# Patient Record
Sex: Male | Born: 1950 | Race: Black or African American | Hispanic: No | Marital: Married | Smoking: Former smoker
Health system: Southern US, Community
[De-identification: ages and names within clinical notes are randomized; demographics above are authoritative.]

## PROBLEM LIST (undated history)

## (undated) DIAGNOSIS — M109 Gout, unspecified: Secondary | ICD-10-CM

## (undated) DIAGNOSIS — I10 Essential (primary) hypertension: Secondary | ICD-10-CM

## (undated) HISTORY — DX: Essential (primary) hypertension: I10

## (undated) HISTORY — PX: COLONOSCOPY: SHX5424

## (undated) HISTORY — PX: WISDOM TOOTH EXTRACTION: SHX21

## (undated) HISTORY — DX: Gout, unspecified: M10.9

## (undated) HISTORY — PX: ARTHROSCOPIC REPAIR ACL: SUR80

---

## 1998-02-01 ENCOUNTER — Emergency Department (HOSPITAL_COMMUNITY): Admission: EM | Admit: 1998-02-01 | Discharge: 1998-02-01 | Payer: Self-pay | Admitting: Emergency Medicine

## 1999-01-01 ENCOUNTER — Encounter: Payer: Self-pay | Admitting: Emergency Medicine

## 1999-01-01 ENCOUNTER — Emergency Department (HOSPITAL_COMMUNITY): Admission: EM | Admit: 1999-01-01 | Discharge: 1999-01-01 | Payer: Self-pay | Admitting: *Deleted

## 2002-07-03 ENCOUNTER — Emergency Department (HOSPITAL_COMMUNITY): Admission: EM | Admit: 2002-07-03 | Discharge: 2002-07-03 | Payer: Self-pay | Admitting: Emergency Medicine

## 2011-02-03 ENCOUNTER — Ambulatory Visit: Payer: BC Managed Care – PPO

## 2011-03-03 ENCOUNTER — Other Ambulatory Visit: Payer: Self-pay | Admitting: Internal Medicine

## 2011-03-03 DIAGNOSIS — I10 Essential (primary) hypertension: Secondary | ICD-10-CM

## 2011-03-09 ENCOUNTER — Other Ambulatory Visit: Payer: Self-pay | Admitting: Internal Medicine

## 2011-04-10 ENCOUNTER — Ambulatory Visit (INDEPENDENT_AMBULATORY_CARE_PROVIDER_SITE_OTHER): Payer: BC Managed Care – PPO | Admitting: Family Medicine

## 2011-04-10 VITALS — BP 158/85 | HR 57 | Temp 98.0°F | Resp 16 | Ht 70.0 in | Wt 193.2 lb

## 2011-04-10 DIAGNOSIS — M7020 Olecranon bursitis, unspecified elbow: Secondary | ICD-10-CM

## 2011-04-10 DIAGNOSIS — M76891 Other specified enthesopathies of right lower limb, excluding foot: Secondary | ICD-10-CM

## 2011-04-10 DIAGNOSIS — D126 Benign neoplasm of colon, unspecified: Secondary | ICD-10-CM

## 2011-04-10 DIAGNOSIS — I10 Essential (primary) hypertension: Secondary | ICD-10-CM

## 2011-04-10 DIAGNOSIS — K635 Polyp of colon: Secondary | ICD-10-CM

## 2011-04-10 DIAGNOSIS — M6580 Other synovitis and tenosynovitis, unspecified site: Secondary | ICD-10-CM

## 2011-04-10 DIAGNOSIS — E119 Type 2 diabetes mellitus without complications: Secondary | ICD-10-CM | POA: Insufficient documentation

## 2011-04-10 MED ORDER — METHYLPREDNISOLONE 4 MG PO TABS: ORAL_TABLET | ORAL | Status: DC

## 2011-04-10 NOTE — Progress Notes (Signed)
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  He does a regular routine of gym workouts and recently did major squat workout with subsequent knee swelling.  Had right effusion in past.  Now both sides of knee have been swollen and sore.  O:  NAD Mildly swollen suprapatellar area without effusion or point tenderness.  Full range of motion.  Area of fluctuance injected with marcaine, but no fluid was aspirated.  Left elbow aspirated after sterile prep, consent and marcaine local:  Amber clear fluid x 12 cc.  No complication.  Ace wrap applied.  A:  Right knee inflammation, left olecranon bursitis  P:  Medrol 4 mg 2 daily x 3, modify workout x 5 day, ace wrap x 3 days

## 2011-04-13 ENCOUNTER — Other Ambulatory Visit: Payer: Self-pay | Admitting: Internal Medicine

## 2011-04-13 ENCOUNTER — Other Ambulatory Visit: Payer: Self-pay | Admitting: Family Medicine

## 2011-04-28 ENCOUNTER — Other Ambulatory Visit: Payer: Self-pay | Admitting: Physician Assistant

## 2011-05-11 ENCOUNTER — Ambulatory Visit (INDEPENDENT_AMBULATORY_CARE_PROVIDER_SITE_OTHER): Payer: BC Managed Care – PPO | Admitting: Internal Medicine

## 2011-05-11 VITALS — BP 160/84 | HR 64 | Temp 98.3°F | Resp 16 | Ht 70.0 in | Wt 190.8 lb

## 2011-05-11 DIAGNOSIS — I10 Essential (primary) hypertension: Secondary | ICD-10-CM

## 2011-05-11 DIAGNOSIS — D1739 Benign lipomatous neoplasm of skin and subcutaneous tissue of other sites: Secondary | ICD-10-CM

## 2011-05-11 DIAGNOSIS — D170 Benign lipomatous neoplasm of skin and subcutaneous tissue of head, face and neck: Secondary | ICD-10-CM

## 2011-05-11 DIAGNOSIS — E119 Type 2 diabetes mellitus without complications: Secondary | ICD-10-CM

## 2011-05-11 LAB — POCT URINALYSIS DIPSTICK
Bilirubin, UA: NEGATIVE
Blood, UA: NEGATIVE
Glucose, UA: NEGATIVE
Leukocytes, UA: NEGATIVE
Nitrite, UA: NEGATIVE

## 2011-05-11 LAB — POCT CBC
Granulocyte percent: 64.6 %G (ref 37–80)
HCT, POC: 41.2 % — AB (ref 43.5–53.7)
Hemoglobin: 13.1 g/dL — AB (ref 14.1–18.1)
Lymph, poc: 1.6 (ref 0.6–3.4)
MCH, POC: 28.5 pg (ref 27–31.2)
MCHC: 31.8 g/dL (ref 31.8–35.4)
MCV: 89.6 fL (ref 80–97)
MID (cbc): 0.3 (ref 0–0.9)
MPV: 7.1 fL (ref 0–99.8)
POC Granulocyte: 3.6 (ref 2–6.9)
POC LYMPH PERCENT: 29.9 %L (ref 10–50)
POC MID %: 5.5 %M (ref 0–12)
Platelet Count, POC: 383 10*3/uL (ref 142–424)
RBC: 4.6 M/uL — AB (ref 4.69–6.13)
RDW, POC: 12.3 %
WBC: 5.5 10*3/uL (ref 4.6–10.2)

## 2011-05-11 LAB — COMPREHENSIVE METABOLIC PANEL
ALT: 15 U/L (ref 0–53)
AST: 19 U/L (ref 0–37)
Albumin: 4.7 g/dL (ref 3.5–5.2)
Alkaline Phosphatase: 39 U/L (ref 39–117)
BUN: 15 mg/dL (ref 6–23)
CO2: 29 mEq/L (ref 19–32)
Calcium: 10.2 mg/dL (ref 8.4–10.5)
Chloride: 104 mEq/L (ref 96–112)
Creat: 1.35 mg/dL (ref 0.50–1.35)
Glucose, Bld: 122 mg/dL — ABNORMAL HIGH (ref 70–99)
Potassium: 4.2 mEq/L (ref 3.5–5.3)
Sodium: 142 mEq/L (ref 135–145)
Total Bilirubin: 0.7 mg/dL (ref 0.3–1.2)
Total Protein: 7.2 g/dL (ref 6.0–8.3)

## 2011-05-11 LAB — POCT GLYCOSYLATED HEMOGLOBIN (HGB A1C): Hemoglobin A1C: 5

## 2011-05-11 LAB — LIPID PANEL: HDL: 67 mg/dL (ref >39)

## 2011-05-11 MED ORDER — LISINOPRIL-HYDROCHLOROTHIAZIDE 20-12.5 MG PO TABS: 1.0000 | ORAL_TABLET | Freq: Two times a day (BID) | ORAL | Status: DC

## 2011-05-11 MED ORDER — METFORMIN HCL 500 MG PO TABS: 500.0000 mg | ORAL_TABLET | Freq: Every day | ORAL | Status: DC

## 2011-05-11 NOTE — Progress Notes (Signed)
  Subjective:    Patient ID: Matthew Simon, male    DOB: May 25, 1950, 61 y.o.   MRN: [id number]  HPI  Matthew Simon is here today for a refill on his metformin.  He does not have a glucometer at home and tells me he is "pre-diabetic".  He feels well and states his BP has not been a problem for him.  He is also here for a lump on the back of his neck which he first noticed one week ago, it is not tender or sore.  He continues to work in Airline pilot, has no lower extremity edema, SOB, or foot sores.  His right knee is somewhat improved, still bothers him sometimes.   Review of Systems  Constitutional: Negative.   HENT: Negative.   Eyes: Negative.   Respiratory: Negative.   Cardiovascular: Negative.   Gastrointestinal: Negative.   Genitourinary: Negative.   Psychiatric/Behavioral: Negative.   All other systems reviewed and are negative.       Objective:   Physical Exam  Vitals reviewed. Constitutional: He is oriented to person, place, and time. He appears well-developed and well-nourished. No distress.  HENT:  Head: Normocephalic.  Mouth/Throat: Oropharynx is clear and moist.  Cardiovascular: Normal rate, regular rhythm and normal heart sounds.   Pulmonary/Chest: Effort normal and breath sounds normal.  Abdominal: Soft.  Lymphadenopathy:    He has no cervical adenopathy.  Neurological: He is alert and oriented to person, place, and time.  Skin: Skin is warm and dry.       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 His behavior is normal.       Pt does not make eye contact when he speaks with me          Assessment & Plan:  Type 2 DM:  Excellent control with A1C at 5.0, continue metformin daily.  Paper chart is not available during pt OV today so I have no previous labs for comparison.  Pt will follow-up with Dr. Perrin Maltese in 6 month.  Lipid panel and CMP pending  HTN:  Not controlled.  Discussed adding amlodipine but pt  prefers to increase current med to BID so we will do that.  He is to monitor his BP and return if it remains elevated or he has other concerns.  Lipoma:  Pt advised this can be removed with possibility of recurring.  He opts for watchful waiting for now.

## 2011-05-11 NOTE — Patient Instructions (Signed)
Please monitor your blood pressure, by checking once or twice a week and if it remains elevated, above 135/85 return to see Dr. Perrin Maltese.  Continue your metformin daily for optimal glucose control. Hypertension As your heart beats, it forces blood through your arteries. This force is your blood pressure. If the pressure is too high, it is called hypertension (HTN) or high blood pressure. HTN is dangerous because you may have it and not know it. High blood pressure may mean that your heart has to work harder to pump blood. Your arteries may be narrow or stiff. The extra work puts you at risk for heart disease, stroke, and other problems.  Blood pressure consists of two numbers, a higher number over a lower, 110/72, for example. It is stated as "110 over 72." The ideal is below 120 for the top number (systolic) and under 80 for the bottom (diastolic). Write down your blood pressure today. You should pay close attention to your blood pressure if you have certain conditions such as:  Heart failure.   Prior heart attack.   Diabetes   Chronic kidney disease.   Prior stroke.   Multiple risk factors for heart disease.  To see if you have HTN, your blood pressure should be measured while you are seated with your arm held at the level of the heart. It should be measured at least twice. A one-time elevated blood pressure reading (especially in the Emergency Department) does not mean that you need treatment. There may be conditions in which the blood pressure is different between your right and left arms. It is important to see your caregiver soon for a recheck. Most people have essential hypertension which means that there is not a specific cause. This type of high blood pressure may be lowered by changing lifestyle factors such as:  Stress.   Smoking.   Lack of exercise.   Excessive weight.   Drug/tobacco/alcohol use.   Eating less salt.  Most people do not have symptoms from high blood pressure  until it has caused damage to the body. Effective treatment can often prevent, delay or reduce that damage. TREATMENT  When a cause has been identified, treatment for high blood pressure is directed at the cause. There are a large number of medications to treat HTN. These fall into several categories, and your caregiver will help you select the medicines that are best for you. Medications may have side effects. You should review side effects with your caregiver. If your blood pressure stays high after you have made lifestyle changes or started on medicines,   Your medication(s) may need to be changed.   Other problems may need to be addressed.   Be certain you understand your prescriptions, and know how and when to take your medicine.   Be sure to follow up with your caregiver within the time frame advised (usually within two weeks) to have your blood pressure rechecked and to review your medications.   If you are taking more than one medicine to lower your blood pressure, make sure you know how and at what times they should be taken. Taking two medicines at the same time can result in blood pressure that is too low.  SEEK IMMEDIATE MEDICAL CARE IF:  You develop a severe headache, blurred or changing vision, or confusion.   You have unusual weakness or numbness, or a faint feeling.   You have severe chest or abdominal pain, vomiting, or breathing problems.  MAKE SURE YOU:   Understand  these instructions.   Will watch your condition.   Will get help right away if you are not doing well or get worse.  Document Released: 01/19/2005 Document Revised: 01/08/2011 Document Reviewed: 09/09/2007 [facility] Patient Information 2012 [place], [place].

## 2011-07-30 ENCOUNTER — Ambulatory Visit (INDEPENDENT_AMBULATORY_CARE_PROVIDER_SITE_OTHER): Payer: BC Managed Care – PPO | Admitting: Family Medicine

## 2011-07-30 VITALS — BP 142/81 | HR 76 | Temp 98.2°F | Resp 16 | Ht 70.0 in | Wt 192.0 lb

## 2011-07-30 DIAGNOSIS — M25469 Effusion, unspecified knee: Secondary | ICD-10-CM

## 2011-07-30 DIAGNOSIS — M25569 Pain in unspecified knee: Secondary | ICD-10-CM

## 2011-07-30 DIAGNOSIS — M25461 Effusion, right knee: Secondary | ICD-10-CM

## 2011-07-30 DIAGNOSIS — M25561 Pain in right knee: Secondary | ICD-10-CM

## 2011-07-30 NOTE — Progress Notes (Signed)
61 yo active man who developed a recurrence of effusion in right knee beginning 10 days PTA.  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  Originally, he injured the right knee wrestling in high school  O:  NAD' Moderate fluid effusion right knee.  Decreased flexion to 90 degrees.  No ligament laxity.  Crepitus with extension along lateral joint line.  After informed consent, the right knee was anesthetized with local lidocaine and marcaine at the lateral joint margin.  34 cc of clear yellow fluid was aspirated and 1 cc of marcaine with depomedrol was injected.  No complications.  Patient tolerated procedure well and had normal gain when leaving.  Synovial fluid was sent for cells and crystals.  A:  Recurrent effusion right knee secondary to internal derangement  P:  Synovial fluid analysis MR of right knee

## 2011-07-30 NOTE — Addendum Note (Signed)
Addended by: Johnnette Litter on: 07/30/2011 02:38 PM   Modules accepted: Orders

## 2011-07-31 LAB — SYNOVIAL CELL COUNT + DIFF, W/ CRYSTALS
Crystals, Fluid: NONE SEEN
Eosinophils-Synovial: 0 % (ref 0–1)
Lymphocytes-Synovial Fld: 0 % (ref 0–20)
Monocyte/Macrophage: 12 % — ABNORMAL LOW (ref 50–90)
Neutrophil, Synovial: 88 % — ABNORMAL HIGH (ref 0–25)
WBC, Synovial: 470 cu mm — ABNORMAL HIGH (ref 0–200)

## 2011-08-03 LAB — BODY FLUID CULTURE
Gram Stain: NONE SEEN
Gram Stain: NONE SEEN
Organism ID, Bacteria: NO GROWTH

## 2011-08-10 ENCOUNTER — Ambulatory Visit (INDEPENDENT_AMBULATORY_CARE_PROVIDER_SITE_OTHER): Payer: BC Managed Care – PPO | Admitting: Family Medicine

## 2011-08-10 VITALS — BP 126/74 | HR 63 | Temp 98.3°F | Resp 16 | Ht 69.5 in | Wt 188.4 lb

## 2011-08-10 DIAGNOSIS — M25461 Effusion, right knee: Secondary | ICD-10-CM

## 2011-08-10 DIAGNOSIS — M25469 Effusion, unspecified knee: Secondary | ICD-10-CM

## 2011-08-10 DIAGNOSIS — M6740 Ganglion, unspecified site: Secondary | ICD-10-CM

## 2011-08-10 NOTE — Patient Instructions (Signed)
Ganglion A ganglion is a swelling under the skin that is filled with a thick, jelly-like substance. It is a synovial cyst. This is caused by a break (rupture) of the joint lining from the joint space. A ganglion often occurs near an area of repeated minor trauma (damage caused by an accident). Trauma may also be a repetitive movement at work or in a sport. TREATMENT   It often goes away without treatment. It may reappear later. Sometimes a ganglion may need to be surgically removed. Often they are drained and injected with a steroid. Sometimes they respond to:  Rest.   Splinting.  HOME CARE INSTRUCTIONS    Your caregiver will decide the best way of treating your ganglion. Do not try to break the ganglion yourself by pressing on it, poking it with a needle, or hitting it with a heavy object.   Use medications as directed.  SEEK MEDICAL CARE IF:    The ganglion becomes larger or more painful.   You have increased redness or swelling.   You have weakness or numbness in your hand or wrist.  MAKE SURE YOU:    Understand these instructions.   Will watch your condition.   Will get help right away if you are not doing well or get worse.  Document Released: 01/17/2000 Document Revised: 01/08/2011 Document Reviewed: 03/15/2007 [employer] Patient Information 2012 [employer]. 

## 2011-08-10 NOTE — Progress Notes (Signed)
61 yo with recurrent right knee effusion.  He did well for a few days after the fluid was drawn off last week, but it has come back and it is uncomfortable.    He also has a swelling on his lower posterior neck, minimally uncomfortable.    He also has bilateral radial sided wrist ganglions.  O:  NAD Right knee:  Moderate effusion.  Definite crepitus with gentle passive ROM. After xylo with epi local anesthesia, 30 ml serous fluid was drawn off.  No complications.  Feels better.  Cyst posterior lower cervical spine: nonfluctuant, nontender c/w lipoma  Bilateral ganglion cysts on radial wrists.  Assessment:  Recurrent knee effusion  Plan: Referral to orthopedics.

## 2011-08-18 ENCOUNTER — Other Ambulatory Visit: Payer: Self-pay

## 2011-08-26 ENCOUNTER — Telehealth: Payer: Self-pay

## 2011-08-26 NOTE — Telephone Encounter (Signed)
DR.LAUENSTEIN, PT STATES THAT HE WOULD LIKE TO SPEAK WITH YOU REGARDING A MRI WOULD NOT GIVE ANY FURTHER DETAILS. BEST# [phone]

## 2011-08-27 ENCOUNTER — Telehealth: Payer: Self-pay

## 2011-08-27 NOTE — Telephone Encounter (Signed)
Pt states he has called three times for dr. l to call him back this is urgent medical situation

## 2011-08-27 NOTE — Telephone Encounter (Signed)
Left message on machine to call back  

## 2011-08-28 NOTE — Telephone Encounter (Signed)
I tried to call him back twice.  I left a message on his machine  I tried to refer him to orthopedics after the second time I drew blood off of his knee.   There is nothing further I think I can do for his knee

## 2011-08-28 NOTE — Telephone Encounter (Signed)
Dr L Patient would like a call back at [phone].

## 2011-09-19 ENCOUNTER — Other Ambulatory Visit: Payer: Self-pay | Admitting: Internal Medicine

## 2011-11-23 ENCOUNTER — Other Ambulatory Visit: Payer: Self-pay | Admitting: Internal Medicine

## 2011-11-23 NOTE — Telephone Encounter (Signed)
Per note in April 2013 pt to follow up in 6 months with Dr. Perrin Maltese, needs OV/labs for more refills

## 2011-12-29 ENCOUNTER — Other Ambulatory Visit: Payer: Self-pay | Admitting: Internal Medicine

## 2012-02-12 ENCOUNTER — Ambulatory Visit (INDEPENDENT_AMBULATORY_CARE_PROVIDER_SITE_OTHER): Payer: BC Managed Care – PPO | Admitting: Family Medicine

## 2012-02-12 ENCOUNTER — Ambulatory Visit: Payer: BC Managed Care – PPO

## 2012-02-12 VITALS — BP 173/95 | HR 60 | Temp 98.2°F | Resp 16 | Ht 71.0 in | Wt 190.0 lb

## 2012-02-12 DIAGNOSIS — M25569 Pain in unspecified knee: Secondary | ICD-10-CM

## 2012-02-12 DIAGNOSIS — E119 Type 2 diabetes mellitus without complications: Secondary | ICD-10-CM

## 2012-02-12 DIAGNOSIS — Z23 Encounter for immunization: Secondary | ICD-10-CM

## 2012-02-12 DIAGNOSIS — Z0000 Encounter for general adult medical examination without abnormal findings: Secondary | ICD-10-CM

## 2012-02-12 DIAGNOSIS — I10 Essential (primary) hypertension: Secondary | ICD-10-CM

## 2012-02-12 LAB — COMPREHENSIVE METABOLIC PANEL
ALT: 21 U/L (ref 0–53)
CO2: 30 mEq/L (ref 19–32)
Creat: 1.25 mg/dL (ref 0.50–1.35)
Total Bilirubin: 0.7 mg/dL (ref 0.3–1.2)

## 2012-02-12 LAB — POCT CBC
HCT, POC: 40.7 % — AB (ref 43.5–53.7)
Lymph, poc: 1.5 (ref 0.6–3.4)
MCHC: 30.2 g/dL — AB (ref 31.8–35.4)
MID (cbc): 0.3 (ref 0–0.9)
MPV: 6.9 fL (ref 0–99.8)
POC Granulocyte: 3.6 (ref 2–6.9)
POC LYMPH PERCENT: 28.4 %L (ref 10–50)
POC MID %: 4.9 %M (ref 0–12)
RDW, POC: 12.4 %

## 2012-02-12 LAB — LIPID PANEL
Cholesterol: 232 mg/dL — ABNORMAL HIGH (ref 0–200)
HDL: 62 mg/dL (ref >39)
Total CHOL/HDL Ratio: 3.7 Ratio

## 2012-02-12 LAB — IFOBT (OCCULT BLOOD): IFOBT: NEGATIVE

## 2012-02-12 LAB — POCT GLYCOSYLATED HEMOGLOBIN (HGB A1C): Hemoglobin A1C: 4.8

## 2012-02-12 MED ORDER — METFORMIN HCL 500 MG PO TABS: 500.0000 mg | ORAL_TABLET | Freq: Every day | ORAL | Status: DC

## 2012-02-12 MED ORDER — LISINOPRIL-HYDROCHLOROTHIAZIDE 20-12.5 MG PO TABS: 1.0000 | ORAL_TABLET | Freq: Two times a day (BID) | ORAL | Status: DC

## 2012-02-12 NOTE — Patient Instructions (Addendum)
Be sure to call Dr. Laural Benes at [facility] about your colonoscopy- it looks like it may be due.    I will be in touch with the rest of your labs as soon as they are available.   He had a tetanus shot in 2009 If you would like, you can get a shingles shot at the drug store.   I will refer you to general surgery to look a the area on the back of your neck.

## 2012-02-12 NOTE — Progress Notes (Signed)
[facility] [address] [place] [address] [phone]  Date:  02/12/2012   Name:  Matthew Simon   DOB:  06/16/1950   MRN:  [id number]  PCP:  No primary provider on file.    Chief Complaint: complete physical   History of Present Illness:  Matthew Simon is a 62 y.o. very pleasant male patient who presents with the following:  Here today for a CPE.  He has a history of HTN and DM.   He does not check his glucose at home.   He is fasting today for labs  He has had trouble with his right knee for a few months- it has swollen up and will be occcasionally painful along the medial joint line.  However, it does not cause persistent pain.    Colonoscopy performed 06/2005.  He is not sure if he was supposed to follow- up in 5 years but thinks that was the recommendation  Patient Active Problem List  Diagnosis  . Hypertension  . Colon polyp  . Type 2 diabetes mellitus    Past Medical History  Diagnosis Date  . Hypertension     History reviewed. No pertinent past surgical history.  History  Substance Use Topics  . Smoking status: Former Smoker    Quit date: 05/10/1964  . Smokeless tobacco: Never Used  . Alcohol Use: 10.5 oz/week    21 drink(s) per week    History reviewed. No pertinent family history.  No Known Allergies  Medication list has been reviewed and updated.  Current Outpatient Prescriptions on File Prior to Visit  Medication Sig Dispense Refill  . aspirin 81 MG tablet Take 81 mg by mouth daily.      Marland Kitchen lisinopril-hydrochlorothiazide (PRINZIDE,ZESTORETIC) 20-12.5 MG per tablet Take 1 tablet by mouth 2 (two) times daily.  180 tablet  1  . metFORMIN (GLUCOPHAGE) 500 MG tablet TAKE 1 TABLET (500 MG TOTAL) BY MOUTH DAILY WITH BREAKFAST. NEEDS OFFICE VISIT FOR MORE  30 tablet  0  . calcium & magnesium carbonates (MYLANTA) 311-232 MG per tablet Take 1 tablet by mouth daily.      Marland Kitchen ketoconazole (NIZORAL) 2 % cream APPLY TO FEET FOR FUNGUS  TWICE A DAY  120 g  2  . metFORMIN (GLUCOPHAGE) 500 MG tablet TAKE 1 TABLET (500 MG TOTAL) BY MOUTH DAILY WITH BREAKFAST. NEEDS OFFICE VISIT FOR MORE  30 tablet  0  . methylPREDNISolone (MEDROL) 4 MG tablet 2 daily  6 tablet  0    Review of Systems:  As per HPI- otherwise negative.   Physical Examination: Filed Vitals:   02/12/12 1227  BP: 173/95  Pulse: 60  Temp: 98.2 F (36.8 C)  Resp: 16   Filed Vitals:   02/12/12 1227  Height: 5\' 11"  (1.803 m)  Weight: 190 lb (86.183 kg)   Body mass index is 26.50 kg/(m^2). Ideal Body Weight: Weight in (lb) to have BMI = 25: 178.9   GEN: WDWN, NAD, Non-toxic, A & O x 3 HEENT: Atraumatic, Normocephalic. Neck supple. No masses, No LAD. Ears and Nose: No external deformity. CV: RRR, No M/G/R. No JVD. No thrill. No extra heart sounds. PULM: CTA B, no wheezes, crackles, rhonchi. No retractions. No resp. distress. No accessory muscle use. ABD: S, NT, ND, +BS. No rebound. No HSM. He points out a ping- pong ball sized mass at the base of his neck- consistent with a lipoma  EXTR: No c/c/e NEURO Normal gait.  PSYCH: Normally  interactive. Conversant. Not depressed or anxious appearing.  Calm demeanor.  Right knee: slight swelling, normal ROM.  Knee is stable, no significant crepitus.   GU:   [facility] reading (PRIMARY) by  Dr. Patsy Lager. Right knee: compare with films performed 09/2010 Moderate medial compartment degenerative change and lateral patellar spur  RIGHT KNEE - COMPLETE 4+ VIEW  Comparison: None.  Findings: Negative for fracture. Normal alignment. Mild joint space narrowing and spurring medially. Mild patellofemoral spurring. There is a moderately large joint effusion.  IMPRESSION: Osteoarthritis with joint effusion  Results for orders placed in visit on 02/12/12  POCT CBC      Component Value Range   WBC 5.4  4.6 - 10.2 K/uL   Lymph, poc 1.5  0.6 - 3.4   POC LYMPH PERCENT 28.4  10 - 50 %L   MID (cbc) 0.3  0 - 0.9   POC MID %  4.9  0 - 12 %M   POC Granulocyte 3.6  2 - 6.9   Granulocyte percent 66.7  37 - 80 %G   RBC 4.37 (*) 4.69 - 6.13 M/uL   Hemoglobin 12.3 (*) 14.1 - 18.1 g/dL   HCT, POC 16.1 (*) 09.6 - 53.7 %   MCV 93.1  80 - 97 fL   MCH, POC 28.1  27 - 31.2 pg   MCHC 30.2 (*) 31.8 - 35.4 g/dL   RDW, POC 04.5     Platelet Count, POC 333  142 - 424 K/uL   MPV 6.9  0 - 99.8 fL  IFOBT (OCCULT BLOOD)      Component Value Range   IFOBT Negative    POCT GLYCOSYLATED HEMOGLOBIN (HGB A1C)      Component Value Range   Hemoglobin A1C 4.8      Assessment and Plan: 1. Physical exam, annual  POCT CBC, IFOBT POC (occult bld, rslt in office), Comprehensive metabolic panel, Lipid panel, PSA  2. Diabetes mellitus, type 2  POCT glycosylated hemoglobin (Hb A1C), metFORMIN (GLUCOPHAGE) 500 MG tablet  3. HTN (hypertension)  Lipid panel, lisinopril-hydrochlorothiazide (PRINZIDE,ZESTORETIC) 20-12.5 MG per tablet  4. Knee pain  DG Knee Complete 4 Views Right  5. Essential hypertension, benign  lisinopril-hydrochlorothiazide (PRINZIDE,ZESTORETIC) 20-12.5 MG per tablet  6. Needs flu shot  Flu vaccine greater than or equal to 3yo preservative free IM   Completed labs for age today and gave rx for zostavax.   Will follow- up as soon as labs available.   Refilled medications.  Control of DM is excellent. He did not take his BP medication yet today, but it has been well controlled on previous visits.    He has knee pain and swelling. Discussed probable diagnosis of a meniscal tear.  Offered referral to ortho but he is not sure he wants to do this yet.  He will let me know if he wants to pursue this.    COPLAND,JESSICA, MD  PSA 07/2010 was 17.9 during an episode of prostatitis.  Will follow- up today

## 2012-02-13 LAB — POCT URINALYSIS DIPSTICK
Bilirubin, UA: NEGATIVE
Blood, UA: NEGATIVE
Ketones, UA: NEGATIVE
Spec Grav, UA: 1.015
pH, UA: 7

## 2012-02-13 LAB — PSA: PSA: 1.09 ng/mL (ref <=4.00)

## 2012-02-16 ENCOUNTER — Encounter: Payer: Self-pay | Admitting: Family Medicine

## 2012-09-02 ENCOUNTER — Ambulatory Visit (INDEPENDENT_AMBULATORY_CARE_PROVIDER_SITE_OTHER): Payer: No Typology Code available for payment source | Admitting: Family Medicine

## 2012-09-02 VITALS — BP 135/85 | HR 86 | Temp 98.0°F | Resp 16 | Ht 71.0 in | Wt 186.0 lb

## 2012-09-02 DIAGNOSIS — M7020 Olecranon bursitis, unspecified elbow: Secondary | ICD-10-CM

## 2012-09-02 DIAGNOSIS — E119 Type 2 diabetes mellitus without complications: Secondary | ICD-10-CM

## 2012-09-02 DIAGNOSIS — E785 Hyperlipidemia, unspecified: Secondary | ICD-10-CM

## 2012-09-02 DIAGNOSIS — M25469 Effusion, unspecified knee: Secondary | ICD-10-CM

## 2012-09-02 DIAGNOSIS — M7022 Olecranon bursitis, left elbow: Secondary | ICD-10-CM

## 2012-09-02 DIAGNOSIS — M25461 Effusion, right knee: Secondary | ICD-10-CM

## 2012-09-02 DIAGNOSIS — B353 Tinea pedis: Secondary | ICD-10-CM

## 2012-09-02 LAB — POCT CBC
Lymph, poc: 2.1 (ref 0.6–3.4)
MCHC: 31.3 g/dL — AB (ref 31.8–35.4)
MID (cbc): 0.4 (ref 0–0.9)
MPV: 6.6 fL (ref 0–99.8)
POC Granulocyte: 3.4 (ref 2–6.9)
POC LYMPH PERCENT: 35.7 %L (ref 10–50)
POC MID %: 6.3 %M (ref 0–12)
RDW, POC: 12.6 %

## 2012-09-02 LAB — LIPID PANEL: Cholesterol: 206 mg/dL — ABNORMAL HIGH (ref 0–200)

## 2012-09-02 MED ORDER — KETOCONAZOLE 2 % EX CREA: TOPICAL_CREAM | CUTANEOUS | Status: DC

## 2012-09-02 NOTE — Patient Instructions (Signed)
Let me know if you have any sign of infection such as redness, swelling, or tenderness of your knee of elbow.   If you decide you would like to see an orthopedist or have an MRI of your knee please let me know Please do follow- up with your GI doctor- you are due for your colonocopy

## 2012-09-02 NOTE — Progress Notes (Signed)
[facility] [address], [place] [address] [phone]  Date:  09/02/2012   Name:  Matthew Simon   DOB:  Jan 04, 1951   MRN:  [id number]  PCP:  No primary provider on file.    Chief Complaint: Joint Swelling and Joint Swelling   History of Present Illness:  Matthew Simon is a 62 y.o. very pleasant male patient who presents with the following:  He has recurrent left elbow olecranon bursitis.  It has swollen up over the last couple of weeks.  It is not really tender.   This has occurred twice this year.  He is not aware of any acute injury.    He has swelling in his right knee again as well. He had an effusion drained about one year ago with good results.  It has been swollen again for about 2 weeks.  It hurts "if I move a certain way."  He is a former avid Armed forces operational officer, and thinks he likely has wear and tear on the knee.  He has noted swelling off and on for about 2 years.  The knee is sometimes worse after he exercises.   The knee can click.  It does feel truly unstable, but not 100% stable either.    He has NIDDM, last A1c was 4.8 in January of this year.  He takes glucophage and lisinopril, aspirin some times.   He did not follow-up his colonoscopy yet- he thinks his last was in 2006 and he was asked to come back for a 5 year follow-up   Patient Active Problem List   Diagnosis Date Noted  . Hypertension 04/10/2011  . Colon polyp 04/10/2011  . Type 2 diabetes mellitus 04/10/2011    Past Medical History  Diagnosis Date  . Hypertension     History reviewed. No pertinent past surgical history.  History  Substance Use Topics  . Smoking status: Former Smoker    Quit date: 05/10/1964  . Smokeless tobacco: Never Used  . Alcohol Use: 10.5 oz/week    21 drink(s) per week    History reviewed. No pertinent family history.  No Known Allergies  Medication list has been reviewed and updated.  Current Outpatient Prescriptions on File Prior to Visit   Medication Sig Dispense Refill  . aspirin 81 MG tablet Take 81 mg by mouth daily.      . calcium & magnesium carbonates (MYLANTA) 311-232 MG per tablet Take 1 tablet by mouth daily.      Marland Kitchen ketoconazole (NIZORAL) 2 % cream APPLY TO FEET FOR FUNGUS TWICE A DAY  120 g  2  . lisinopril-hydrochlorothiazide (PRINZIDE,ZESTORETIC) 20-12.5 MG per tablet Take 1 tablet by mouth 2 (two) times daily.  180 tablet  3  . metFORMIN (GLUCOPHAGE) 500 MG tablet Take 1 tablet (500 mg total) by mouth daily with breakfast.  90 tablet  3  . methylPREDNISolone (MEDROL) 4 MG tablet 2 daily  6 tablet  0   No current facility-administered medications on file prior to visit.    Review of Systems:  As per HPI- otherwise negative.   Physical Examination: Filed Vitals:   09/02/12 1328  BP: 140/90  Pulse: 86  Temp: 98 F (36.7 C)  Resp: 16   Filed Vitals:   09/02/12 1328  Height: 5\' 11"  (1.803 m)  Weight: 186 lb (84.369 kg)   Body mass index is 25.95 kg/(m^2). Ideal Body Weight: Weight in (lb) to have BMI = 25: 178.9  GEN: WDWN, NAD,  Non-toxic, A & O x 3 HEENT: Atraumatic, Normocephalic. Neck supple. No masses, No LAD. Ears and Nose: No external deformity. CV: RRR, No M/G/R. No JVD. No thrill. No extra heart sounds. PULM: CTA B, no wheezes, crackles, rhonchi. No retractions. No resp. distress. No accessory muscle use. EXTR: No c/c/e NEURO Normal gait.  PSYCH: Normally interactive. Conversant. Not depressed or anxious appearing.  Calm demeanor.  Left elbow: there is a soft swelling, about the size of a ping pong ball over the olecranon bursa.  No redness, tenderness, heat or other sign of infection.  Normal flexion, extension, pronation and supination Right knee: mild crepitus, full ROM.  Knee is stable, there is a small to moderate effusion.  No redness or heat.  Negative anterior drawer.    Results for orders placed in visit on 09/02/12  POCT GLYCOSYLATED HEMOGLOBIN (HGB A1C)      Result Value Range    Hemoglobin A1C 4.7    POCT CBC      Result Value Range   WBC 5.9  4.6 - 10.2 K/uL   Lymph, poc 2.1  0.6 - 3.4   POC LYMPH PERCENT 35.7  10 - 50 %L   MID (cbc) 0.4  0 - 0.9   POC MID % 6.3  0 - 12 %M   POC Granulocyte 3.4  2 - 6.9   Granulocyte percent 58.0  37 - 80 %G   RBC 4.36 (*) 4.69 - 6.13 M/uL   Hemoglobin 12.8 (*) 14.1 - 18.1 g/dL   HCT, POC 16.1 (*) 09.6 - 53.7 %   MCV 93.7  80 - 97 fL   MCH, POC 29.4  27 - 31.2 pg   MCHC 31.3 (*) 31.8 - 35.4 g/dL   RDW, POC 04.5     Platelet Count, POC 321  142 - 424 K/uL   MPV 6.6  0 - 99.8 fL    Discussed with pt.  He has recurrent olecranon bursitis of his elbow and recurrent knee effusion likely due to degenerative change or meniscal tear.  Explained that there is always a risk of infection with any joint aspiration, and that the fluid is likely to come back.  He would like to proceed with elbow and knee aspiration .   VC obtained.   Left elbow prepped with betadine and alcohol.  Local anesthesia with a small wheal of plain 1% lidocaine.  Aspirated with an 18 gauge needle- serous fluid, clear.  Pt tolerated well, dressed with a band- aid and ace bandage for light compression Right knee prepped with betadine and alcohol.  Placed 3ml of lidocaine in a track on lateral approach.  Introduced 20 gauge needle and withdrew clear serous fluid from knee.  Pt tolerated well, dressed with a band- aid Aspirated 12 cc from left elbow, 22 cc from right knee  Assessment and Plan: Type II or unspecified type diabetes mellitus without mention of complication, not stated as uncontrolled - Plan: POCT glycosylated hemoglobin (Hb A1C)  Dyslipidemia (high LDL; low HDL) - Plan: POCT CBC, Lipid panel  Tinea pedis - Plan: ketoconazole (NIZORAL) 2 % cream  Olecranon bursitis of left elbow  Knee effusion, right  DM well coontroled Await LDL for further follow-up Refilled his nizoral cram for tinea pedis Mild anemia- reminded to follow- up with GI.      Signed Abbe Amsterdam, MD

## 2012-09-03 ENCOUNTER — Encounter: Payer: Self-pay | Admitting: Family Medicine

## 2012-09-05 ENCOUNTER — Telehealth: Payer: Self-pay

## 2012-09-05 NOTE — Telephone Encounter (Signed)
Patient would like only dr copland to call him regarding he is having symptoms? Best number is [phone]

## 2012-09-05 NOTE — Telephone Encounter (Signed)
Call is regarding is last diagnosis?

## 2012-09-05 NOTE — Telephone Encounter (Signed)
Type II or unspecified type diabetes mellitus without mention of complication, not stated as uncontrolled - Plan: POCT glycosylated hemoglobin (Hb A1C)  Dyslipidemia (high LDL; low HDL) - Plan: POCT CBC, Lipid panel  Tinea pedis - Plan: ketoconazole (NIZORAL) 2 % cream  Olecranon bursitis of left elbow  Knee effusion, right  DM well coontroled  Await LDL for further follow-up  Refilled his nizoral cram for tinea pedis  Mild anemia- reminded to follow- up with GI.   Left message for him to call me back.

## 2012-09-07 ENCOUNTER — Ambulatory Visit (INDEPENDENT_AMBULATORY_CARE_PROVIDER_SITE_OTHER): Payer: No Typology Code available for payment source | Admitting: Emergency Medicine

## 2012-09-07 ENCOUNTER — Ambulatory Visit: Payer: No Typology Code available for payment source

## 2012-09-07 VITALS — BP 124/60 | HR 98 | Temp 98.1°F | Resp 16 | Ht 71.0 in | Wt 186.8 lb

## 2012-09-07 DIAGNOSIS — L0291 Cutaneous abscess, unspecified: Secondary | ICD-10-CM

## 2012-09-07 DIAGNOSIS — E119 Type 2 diabetes mellitus without complications: Secondary | ICD-10-CM

## 2012-09-07 DIAGNOSIS — M25522 Pain in left elbow: Secondary | ICD-10-CM

## 2012-09-07 DIAGNOSIS — L0390 Cellulitis, unspecified: Secondary | ICD-10-CM

## 2012-09-07 DIAGNOSIS — M25529 Pain in unspecified elbow: Secondary | ICD-10-CM

## 2012-09-07 LAB — POCT CBC
Granulocyte percent: 74.3 %G (ref 37–80)
Hemoglobin: 12.9 g/dL — AB (ref 14.1–18.1)
MPV: 7.4 fL (ref 0–99.8)
POC Granulocyte: 6.5 (ref 2–6.9)
POC MID %: 6.7 %M (ref 0–12)
Platelet Count, POC: 352 10*3/uL (ref 142–424)
RBC: 4.42 M/uL — AB (ref 4.69–6.13)
WBC: 8.8 10*3/uL (ref 4.6–10.2)

## 2012-09-07 LAB — URIC ACID: Uric Acid, Serum: 9.2 mg/dL — ABNORMAL HIGH (ref 4.0–7.8)

## 2012-09-07 LAB — GLUCOSE, POCT (MANUAL RESULT ENTRY): POC Glucose: 113 mg/dl — AB (ref 70–99)

## 2012-09-07 MED ORDER — DOXYCYCLINE HYCLATE 100 MG PO CAPS: 100.0000 mg | ORAL_CAPSULE | Freq: Two times a day (BID) | ORAL | Status: DC

## 2012-09-07 MED ORDER — MELOXICAM 15 MG PO TABS: 15.0000 mg | ORAL_TABLET | Freq: Every day | ORAL | Status: DC

## 2012-09-07 NOTE — Telephone Encounter (Signed)
Called him back- the swelling in his elbow came "right back," and it seems harder, but not really painful. Advised him that he could possibly have an infection, asked him to come in for a recheck today. He agreed to do so.

## 2012-09-07 NOTE — Progress Notes (Addendum)
  Subjective:    Patient ID: Matthew Simon, male    DOB: Jul 03, 1950, 62 y.o.   MRN: [id number]  HPI  62 y.o. Male presents to clinic today to follow up on bursitis of the left elbow. Patient feels elbow is worse then is was when last seen. Saw Dr. Patsy Lager on 09/02/2012; Elbow was drained and soft at that time. Is now hard and swollen.   No x-rays were done at the time of elbow being drainned. Having a lot of soreness.  Patient denies ever having any fever.   Review of Systems     Objective:   Physical Exam there is tenderness and swelling over the left olecranon bursa. There is puffiness of the proximal arm. The area around the olecranon is warm and slightly red. There is good flexion extension at the elbow.  Results for orders placed in visit on 09/07/12  POCT CBC      Result Value Range   WBC 8.8  4.6 - 10.2 K/uL   Lymph, poc 1.7  0.6 - 3.4   POC LYMPH PERCENT 19.0  10 - 50 %L   MID (cbc) 0.6  0 - 0.9   POC MID % 6.7  0 - 12 %M   POC Granulocyte 6.5  2 - 6.9   Granulocyte percent 74.3  37 - 80 %G   RBC 4.42 (*) 4.69 - 6.13 M/uL   Hemoglobin 12.9 (*) 14.1 - 18.1 g/dL   HCT, POC 78.2 (*) 95.6 - 53.7 %   MCV 93.4  80 - 97 fL   MCH, POC 29.2  27 - 31.2 pg   MCHC 31.2 (*) 31.8 - 35.4 g/dL   RDW, POC 21.3     Platelet Count, POC 352  142 - 424 K/uL   MPV 7.4  0 - 99.8 fL   Glucose was 113 [facility] reading (PRIMARY) by  Dr. Cleta Alberts  Normal.      Assessment & Plan:  Previous fluid analysis revealed a negative culture negative crystals with an older white count. Will cover with doxycycline and Mobic recheck 48 hours. And make a referral to [place] [facility] and see if he can be seen in the next couple of days for his elbow discomfort.

## 2012-09-07 NOTE — Telephone Encounter (Signed)
Spoke to patient, he indicates the elbow and the knee are swollen now worse than before you aspirated these areas please advise.

## 2013-02-24 ENCOUNTER — Other Ambulatory Visit: Payer: Self-pay | Admitting: Family Medicine

## 2013-04-11 ENCOUNTER — Other Ambulatory Visit: Payer: Self-pay | Admitting: Physician Assistant

## 2013-05-19 ENCOUNTER — Telehealth: Payer: Self-pay

## 2013-05-19 DIAGNOSIS — I10 Essential (primary) hypertension: Secondary | ICD-10-CM

## 2013-05-19 DIAGNOSIS — E119 Type 2 diabetes mellitus without complications: Secondary | ICD-10-CM

## 2013-05-19 MED ORDER — METFORMIN HCL 500 MG PO TABS: 500.0000 mg | ORAL_TABLET | Freq: Every day | ORAL | Status: DC

## 2013-05-19 MED ORDER — LISINOPRIL-HYDROCHLOROTHIAZIDE 20-12.5 MG PO TABS: 1.0000 | ORAL_TABLET | Freq: Every day | ORAL | Status: DC

## 2013-05-19 NOTE — Telephone Encounter (Signed)
Gave a month supply with a RF.  Called and LMOM- please come and see me when he can, we do need to at least check his renal fxn

## 2013-05-19 NOTE — Telephone Encounter (Signed)
Patient called wanting a refill on his medication.  (lisinopril and metformin)  Has not been seen since 09/2012.  Advised patient to come into office to be seen.  He states that he has no insurance and needs his medication. He is asking for Dr. Lorelei Pont to call him.

## 2013-12-27 ENCOUNTER — Telehealth: Payer: Self-pay

## 2013-12-27 ENCOUNTER — Ambulatory Visit (INDEPENDENT_AMBULATORY_CARE_PROVIDER_SITE_OTHER): Payer: Self-pay | Admitting: Emergency Medicine

## 2013-12-27 VITALS — BP 140/84 | HR 105 | Temp 98.8°F | Resp 16 | Ht 71.0 in | Wt 186.0 lb

## 2013-12-27 DIAGNOSIS — M109 Gout, unspecified: Secondary | ICD-10-CM

## 2013-12-27 DIAGNOSIS — E119 Type 2 diabetes mellitus without complications: Secondary | ICD-10-CM

## 2013-12-27 DIAGNOSIS — M10061 Idiopathic gout, right knee: Secondary | ICD-10-CM

## 2013-12-27 LAB — POCT CBC
Granulocyte percent: 84.2 %G — AB (ref 37–80)
HCT, POC: 40.2 % — AB (ref 43.5–53.7)
HEMOGLOBIN: 12.8 g/dL — AB (ref 14.1–18.1)
Lymph, poc: 1.3 (ref 0.6–3.4)
MCH, POC: 28.4 pg (ref 27–31.2)
MCHC: 31.9 g/dL (ref 31.8–35.4)
MCV: 89.1 fL (ref 80–97)
MID (cbc): 0.2 (ref 0–0.9)
MPV: 6 fL (ref 0–99.8)
POC Granulocyte: 8.2 — AB (ref 2–6.9)
POC LYMPH PERCENT: 13.5 %L (ref 10–50)
POC MID %: 2.3 %M (ref 0–12)
Platelet Count, POC: 512 10*3/uL — AB (ref 142–424)
RBC: 4.51 M/uL — AB (ref 4.69–6.13)
RDW, POC: 12.3 %
WBC: 9.7 10*3/uL (ref 4.6–10.2)

## 2013-12-27 LAB — GLUCOSE, POCT (MANUAL RESULT ENTRY): POC Glucose: 120 mg/dl — AB (ref 70–99)

## 2013-12-27 LAB — POCT GLYCOSYLATED HEMOGLOBIN (HGB A1C): Hemoglobin A1C: 4.9

## 2013-12-27 MED ORDER — HYDROCODONE-ACETAMINOPHEN 5-325 MG PO TABS: 1.0000 | ORAL_TABLET | Freq: Four times a day (QID) | ORAL | Status: DC | PRN

## 2013-12-27 MED ORDER — COLCHICINE 0.6 MG PO TABS: 0.6000 mg | ORAL_TABLET | Freq: Two times a day (BID) | ORAL | Status: DC

## 2013-12-27 NOTE — Patient Instructions (Addendum)
We have an appt scheduled for you for a complete physical on March 01, 2014 at 9:15.Gout Gout is an inflammatory arthritis caused by a buildup of uric acid crystals in the joints. Uric acid is a chemical that is normally present in the blood. When the level of uric acid in the blood is too high it can form crystals that deposit in your joints and tissues. This causes joint redness, soreness, and swelling (inflammation). Repeat attacks are common. Over time, uric acid crystals can form into masses (tophi) near a joint, destroying bone and causing disfigurement. Gout is treatable and often preventable. CAUSES  The disease begins with elevated levels of uric acid in the blood. Uric acid is produced by your body when it breaks down a naturally found substance called purines. Certain foods you eat, such as meats and fish, contain high amounts of purines. Causes of an elevated uric acid level include:  Being passed down from parent to child (heredity).  Diseases that cause increased uric acid production (such as obesity, psoriasis, and certain cancers).  Excessive alcohol use.  Diet, especially diets rich in meat and seafood.  Medicines, including certain cancer-fighting medicines (chemotherapy), water pills (diuretics), and aspirin.  Chronic kidney disease. The kidneys are no longer able to remove uric acid well.  Problems with metabolism. Conditions strongly associated with gout include:  Obesity.  High blood pressure.  High cholesterol.  Diabetes. Not everyone with elevated uric acid levels gets gout. It is not understood why some people get gout and others do not. Surgery, joint injury, and eating too much of certain foods are some of the factors that can lead to gout attacks. SYMPTOMS   An attack of gout comes on quickly. It causes intense pain with redness, swelling, and warmth in a joint.  Fever can occur.  Often, only one joint is involved. Certain joints are more commonly  involved:  Base of the big toe.  Knee.  Ankle.  Wrist.  Finger. Without treatment, an attack usually goes away in a few days to weeks. Between attacks, you usually will not have symptoms, which is different from many other forms of arthritis. DIAGNOSIS  Your caregiver will suspect gout based on your symptoms and exam. In some cases, tests may be recommended. The tests may include:  Blood tests.  Urine tests.  X-rays.  Joint fluid exam. This exam requires a needle to remove fluid from the joint (arthrocentesis). Using a microscope, gout is confirmed when uric acid crystals are seen in the joint fluid. TREATMENT  There are two phases to gout treatment: treating the sudden onset (acute) attack and preventing attacks (prophylaxis).  Treatment of an Acute Attack.  Medicines are used. These include anti-inflammatory medicines or steroid medicines.  An injection of steroid medicine into the affected joint is sometimes necessary.  The painful joint is rested. Movement can worsen the arthritis.  You may use warm or cold treatments on painful joints, depending which works best for you.  Treatment to Prevent Attacks.  If you suffer from frequent gout attacks, your caregiver may advise preventive medicine. These medicines are started after the acute attack subsides. These medicines either help your kidneys eliminate uric acid from your body or decrease your uric acid production. You may need to stay on these medicines for a very long time.  The early phase of treatment with preventive medicine can be associated with an increase in acute gout attacks. For this reason, during the first few months of treatment, your caregiver  may also advise you to take medicines usually used for acute gout treatment. Be sure you understand your caregiver's directions. Your caregiver may make several adjustments to your medicine dose before these medicines are effective.  Discuss dietary treatment with your  caregiver or dietitian. Alcohol and drinks high in sugar and fructose and foods such as meat, poultry, and seafood can increase uric acid levels. Your caregiver or dietitian can advise you on drinks and foods that should be limited. HOME CARE INSTRUCTIONS   Do not take aspirin to relieve pain. This raises uric acid levels.  Only take over-the-counter or prescription medicines for pain, discomfort, or fever as directed by your caregiver.  Rest the joint as much as possible. When in bed, keep sheets and blankets off painful areas.  Keep the affected joint raised (elevated).  Apply warm or cold treatments to painful joints. Use of warm or cold treatments depends on which works best for you.  Use crutches if the painful joint is in your leg.  Drink enough fluids to keep your urine clear or pale yellow. This helps your body get rid of uric acid. Limit alcohol, sugary drinks, and fructose drinks.  Follow your dietary instructions. Pay careful attention to the amount of protein you eat. Your daily diet should emphasize fruits, vegetables, whole grains, and fat-free or low-fat milk products. Discuss the use of coffee, vitamin C, and cherries with your caregiver or dietitian. These may be helpful in lowering uric acid levels.  Maintain a healthy body weight. SEEK MEDICAL CARE IF:   You develop diarrhea, vomiting, or any side effects from medicines.  You do not feel better in 24 hours, or you are getting worse. SEEK IMMEDIATE MEDICAL CARE IF:   Your joint becomes suddenly more tender, and you have chills or a fever. MAKE SURE YOU:   Understand these instructions.  Will watch your condition.  Will get help right away if you are not doing well or get worse. Document Released: 01/17/2000 Document Revised: 06/05/2013 Document Reviewed: 09/02/2011 [employer] Patient Information 2015 [place], [place]. This information is not intended to replace advice given to you by your health care provider. Make  sure you discuss any questions you have with your health care provider.

## 2013-12-27 NOTE — Progress Notes (Addendum)
Subjective:    Patient ID: Matthew Simon, male    DOB: 19-Jun-1950, 63 y.o.   MRN: [id number] This chart was scribed for Matthew Queen, MD by Marti Sleigh, Medical Scribe. This patient was seen in Room 9 and the patient's care was started at 10:16 AM.  Chief Complaint  Patient presents with  . Gout  . Diabetes    med refill  . Hypertension    med refill    HPI HPI Comments: [name] is a 63 y.o. male with a hx of gout, DM, and HTN who presents to [facility] complaining of a gout flare up. Pt states he is in severe pain and would like the fluid removed from his leg. Pt would also like a long-term solution for his gout, and is interested in medication.    Review of Systems  Constitutional: Negative for fever and chills.  HENT: Negative for congestion.   Respiratory: Negative for cough.   Cardiovascular: Positive for leg swelling.  Musculoskeletal: Positive for arthralgias.       Objective:   Physical Exam  Constitutional: He is oriented to person, place, and time. He appears well-developed and well-nourished.  HENT:  Head: Normocephalic and atraumatic.  Eyes: Pupils are equal, round, and reactive to light.  Neck: Neck supple.  Cardiovascular: Normal rate and regular rhythm.   Pulmonary/Chest: Effort normal and breath sounds normal. No respiratory distress.  Musculoskeletal:  Pt has diffuse swelling and tenderness over right knee, right ankle, foot, and first MTP joint.  Neurological: He is alert and oriented to person, place, and time.  Skin: Skin is warm and dry.  Psychiatric: He has a normal mood and affect. His behavior is normal.  Nursing note and vitals reviewed.  Results for orders placed or performed in visit on 12/27/13  POCT CBC  Result Value Ref Range   WBC 9.7 4.6 - 10.2 K/uL   Lymph, poc 1.3 0.6 - 3.4   POC LYMPH PERCENT 13.5 10 - 50 %L   MID (cbc) 0.2 0 - 0.9   POC MID % 2.3 0 - 12 %M   POC Granulocyte 8.2 (A) 2 - 6.9   Granulocyte percent 84.2 (A) 37 - 80  %G   RBC 4.51 (A) 4.69 - 6.13 M/uL   Hemoglobin 12.8 (A) 14.1 - 18.1 g/dL   HCT, POC 40.2 (A) 43.5 - 53.7 %   MCV 89.1 80 - 97 fL   MCH, POC 28.4 27 - 31.2 pg   MCHC 31.9 31.8 - 35.4 g/dL   RDW, POC 12.3 %   Platelet Count, POC 512 (A) 142 - 424 K/uL   MPV 6.0 0 - 99.8 fL  POCT glucose (manual entry)  Result Value Ref Range   POC Glucose 120 (A) 70 - 99 mg/dl  POCT glycosylated hemoglobin (Hb A1C)  Result Value Ref Range   Hemoglobin A1C 4.9        Assessment & Plan:  Patient requires maximal assistance to walk. He uses a walker but also needs assistance to help him. He needed assistance to get into his car. He will be treated with colchicine and hydrocodone for pain they were advised to getting some help getting into the house. He was told to stop his metformin due to a normal hemoglobin A1c. I will see him back on Saturday to be sure he is improving.I personally performed the services described in this documentation, which was scribed in my presence. The recorded information has been reviewed and is accurate.  I called to check on palpation about 3:30 to be sure he got home safely. He was resting with his leg elevated.

## 2013-12-27 NOTE — Progress Notes (Deleted)
   Subjective:    Patient ID: Matthew Simon, male    DOB: 28-Jun-1950, 63 y.o.   MRN: [id number]  HPI    Review of Systems     Objective:   Physical Exam        Assessment & Plan:

## 2013-12-28 LAB — COMPLETE METABOLIC PANEL WITH GFR
ALK PHOS: 80 U/L (ref 39–117)
ALT: 32 U/L (ref 0–53)
AST: 29 U/L (ref 0–37)
Albumin: 4.2 g/dL (ref 3.5–5.2)
BUN: 20 mg/dL (ref 6–23)
CO2: 26 meq/L (ref 19–32)
Calcium: 9.9 mg/dL (ref 8.4–10.5)
Chloride: 98 mEq/L (ref 96–112)
Creat: 1.26 mg/dL (ref 0.50–1.35)
GFR, EST AFRICAN AMERICAN: 69 mL/min
GFR, Est Non African American: 60 mL/min
Glucose, Bld: 106 mg/dL — ABNORMAL HIGH (ref 70–99)
POTASSIUM: 4.4 meq/L (ref 3.5–5.3)
SODIUM: 139 meq/L (ref 135–145)
TOTAL PROTEIN: 7.4 g/dL (ref 6.0–8.3)
Total Bilirubin: 0.6 mg/dL (ref 0.2–1.2)

## 2013-12-28 LAB — URIC ACID: Uric Acid, Serum: 6.2 mg/dL (ref 4.0–7.8)

## 2013-12-29 ENCOUNTER — Telehealth: Payer: Self-pay

## 2013-12-29 NOTE — Telephone Encounter (Signed)
Opened in error, duplicate.

## 2013-12-29 NOTE — Telephone Encounter (Signed)
Pt CB and reported he is seeing just a small amount of improvement. The swelling in his feet has decreased some, but they still are very painful and his knee is as well. Still having difficulty walking. Pt stated he plans to come see Dr Everlene Farrier tomorrow betw 1-2 pm and advised him to call when he gets here and we can send someone out to help him in.

## 2013-12-29 NOTE — Telephone Encounter (Signed)
Dr Everlene Farrier asked me to call and check on pt's knee, and to advise him to RTC tomorrow if not improving, or see someone today if worsening. LMOM to CB w/status.

## 2013-12-30 ENCOUNTER — Ambulatory Visit (INDEPENDENT_AMBULATORY_CARE_PROVIDER_SITE_OTHER): Payer: Self-pay | Admitting: Emergency Medicine

## 2013-12-30 VITALS — BP 114/70 | HR 89 | Temp 97.7°F | Resp 16 | Ht 71.5 in

## 2013-12-30 DIAGNOSIS — M76891 Other specified enthesopathies of right lower limb, excluding foot: Secondary | ICD-10-CM

## 2013-12-30 DIAGNOSIS — M25561 Pain in right knee: Secondary | ICD-10-CM

## 2013-12-30 DIAGNOSIS — M25572 Pain in left ankle and joints of left foot: Secondary | ICD-10-CM

## 2013-12-30 DIAGNOSIS — I10 Essential (primary) hypertension: Secondary | ICD-10-CM

## 2013-12-30 DIAGNOSIS — M6580 Other synovitis and tenosynovitis, unspecified site: Secondary | ICD-10-CM

## 2013-12-30 LAB — POCT CBC
Granulocyte percent: 75.4 %G (ref 37–80)
HCT, POC: 41.1 % — AB (ref 43.5–53.7)
Hemoglobin: 12.9 g/dL — AB (ref 14.1–18.1)
LYMPH, POC: 1.9 (ref 0.6–3.4)
MCH, POC: 28 pg (ref 27–31.2)
MCHC: 31.4 g/dL — AB (ref 31.8–35.4)
MCV: 89.1 fL (ref 80–97)
MID (cbc): 0.6 (ref 0–0.9)
MPV: 6.2 fL (ref 0–99.8)
POC GRANULOCYTE: 7.4 — AB (ref 2–6.9)
POC LYMPH %: 18.9 % (ref 10–50)
POC MID %: 5.7 % (ref 0–12)
Platelet Count, POC: 574 10*3/uL — AB (ref 142–424)
RBC: 4.61 M/uL — AB (ref 4.69–6.13)
RDW, POC: 12.4 %
WBC: 9.8 10*3/uL (ref 4.6–10.2)

## 2013-12-30 LAB — C-REACTIVE PROTEIN: CRP: 6.4 mg/dL — AB (ref <0.60)

## 2013-12-30 LAB — POCT SEDIMENTATION RATE: POCT SED RATE: 55 mm/h — AB (ref 0–22)

## 2013-12-30 LAB — RHEUMATOID FACTOR: Rhuematoid fact SerPl-aCnc: <10 IU/mL (ref <=14)

## 2013-12-30 MED ORDER — PREDNISONE 10 MG PO TABS: ORAL_TABLET | ORAL | Status: DC

## 2013-12-30 MED ORDER — HYDROCODONE-ACETAMINOPHEN 5-325 MG PO TABS: 1.0000 | ORAL_TABLET | Freq: Four times a day (QID) | ORAL | Status: DC | PRN

## 2013-12-30 NOTE — Progress Notes (Addendum)
Subjective:  This chart was scribed for Nena Jordan, MD by Dellis Filbert, ED Scribe at [facility].The patient was seen in exam room 04 and the patient's care was started at 1:29 PM.   Patient ID: Matthew Simon, male    DOB: [birth year], 63 y.o.   MRN: [id number] Chief Complaint  Patient presents with  . Follow-up    Pt. is still having pain in his feet and having trouble walking. Feet swelling as well.    HPI  HPI Comments: Matthew Simon is a 63 y.o. male with a history of DM and HTN who presents to [facility] for a follow up for gout. Pt has more pain in the right knee and right ankle but the swelling in both legs has gone down however the swelling is present. He states his right knee is very sensitive. He denies abdominal pain, calf and thigh pain.   Patient Active Problem List   Diagnosis Date Noted  . Hypertension 04/10/2011  . Colon polyp 04/10/2011  . Type 2 diabetes mellitus 04/10/2011   Past Medical History  Diagnosis Date  . Hypertension    History reviewed. No pertinent past surgical history. No Known Allergies Prior to Admission medications   Medication Sig Start Date End Date Taking? Authorizing Provider  aspirin 81 MG tablet Take 81 mg by mouth daily.   Yes Historical Provider, MD  calcium & magnesium carbonates (MYLANTA) 311-232 MG per tablet Take 1 tablet by mouth daily.   Yes Historical Provider, MD  colchicine 0.6 MG tablet Take 1 tablet (0.6 mg total) by mouth 2 (two) times daily. 12/27/13  Yes [name], MD  doxycycline (VIBRAMYCIN) 100 MG capsule Take 1 capsule (100 mg total) by mouth 2 (two) times daily. 09/07/12  Yes [name], MD  HYDROcodone-acetaminophen (NORCO) 5-325 MG per tablet Take 1 tablet by mouth every 6 (six) hours as needed. 12/27/13  Yes [name], MD  ketoconazole (NIZORAL) 2 % cream APPLY TO FEET FOR FUNGUS TWICE A DAY 09/02/12  Yes Gay Filler Copland, MD  lisinopril-hydrochlorothiazide (PRINZIDE,ZESTORETIC) 20-12.5 MG per  tablet Take 1 tablet by mouth daily. PATIENT NEEDS OFFICE VISIT FOR ADDITIONAL REFILLS - 2nd NOTICE 05/19/13  Yes Gay Filler Copland, MD  meloxicam (MOBIC) 15 MG tablet Take 1 tablet (15 mg total) by mouth daily. 09/07/12  Yes [name], MD  metFORMIN (GLUCOPHAGE) 500 MG tablet Take 1 tablet (500 mg total) by mouth daily. PATIENT NEEDS OFFICE VISIT FOR ADDITIONAL REFILLS - 2nd NOTICE 05/19/13  Yes Darreld Mclean, MD  methylPREDNISolone (MEDROL) 4 MG tablet 2 daily 04/10/11  Yes Robyn Haber, MD   History   Social History  . Marital Status: Married    Spouse Name: N/A    Number of Children: N/A  . Years of Education: N/A   Occupational History  . Not on file.   Social History Main Topics  . Smoking status: Former Smoker    Quit date: 05/10/1964  . Smokeless tobacco: Never Used  . Alcohol Use: 10.5 oz/week    21 drink(s) per week  . Drug Use: No  . Sexual Activity: Not on file   Other Topics Concern  . Not on file   Social History Narrative   Review of Systems  Gastrointestinal: Negative for abdominal pain.  Musculoskeletal: Positive for arthralgias and gait problem.      Objective:  Physical Exam  Constitutional: He is oriented to person, place, and time. He appears  well-developed and well-nourished.  Pt appears to be in significant pain with his right and left ankle.  HENT:  Head: Normocephalic and atraumatic.  Eyes: EOM are normal.  Neck: Normal range of motion.  Cardiovascular: Normal rate.   Pulmonary/Chest: Effort normal.  Musculoskeletal: Normal range of motion.  He has exquisite tenderness over the entire right knee with sever pain with flexion and extension. There is moderate swelling noted.  There is tenderness over the left ankle with flexion and extension.  Neurological: He is alert and oriented to person, place, and time.  Skin: Skin is warm and dry.  Psychiatric: He has a normal mood and affect. His behavior is normal.  Nursing note and vitals  reviewed.  BP 114/70 mmHg  Pulse 89  Temp(Src) 97.7 F (36.5 C) (Oral)  Resp 16  Ht 5' 11.5" (1.816 m)  SpO2 100% Results for orders placed or performed in visit on 12/30/13  POCT CBC  Result Value Ref Range   WBC 9.8 4.6 - 10.2 K/uL   Lymph, poc 1.9 0.6 - 3.4   POC LYMPH PERCENT 18.9 10 - 50 %L   MID (cbc) 0.6 0 - 0.9   POC MID % 5.7 0 - 12 %M   POC Granulocyte 7.4 (A) 2 - 6.9   Granulocyte percent 75.4 37 - 80 %G   RBC 4.61 (A) 4.69 - 6.13 M/uL   Hemoglobin 12.9 (A) 14.1 - 18.1 g/dL   HCT, POC 41.1 (A) 43.5 - 53.7 %   MCV 89.1 80 - 97 fL   MCH, POC 28.0 27 - 31.2 pg   MCHC 31.4 (A) 31.8 - 35.4 g/dL   RDW, POC 12.4 %   Platelet Count, POC 574 (A) 142 - 424 K/uL   MPV 6.2 0 - 99.8 fL   he does have sensation in both feet to filament testing and does have palpable posterior tibial and dorsalis pedis pulses.    Assessment & Plan:  Patient remains afebrile. We'll change to prednisone continue pain medication get orthopedic help on Monday. eventually he will need to see rheumatology. His colchicine was put on hold.

## 2014-01-01 ENCOUNTER — Telehealth: Payer: Self-pay | Admitting: *Deleted

## 2014-01-01 LAB — ANA: Anti Nuclear Antibody(ANA): NEGATIVE

## 2014-01-01 NOTE — Telephone Encounter (Signed)
Dr. Everlene Farrier wanted to check to see how pt is doing.  Pt stated that he is doing well and he went to see Dr. Louanne Skye and got a steroid shot in his knee.

## 2014-01-03 LAB — ANTISTREPTOLYSIN O TITER: ASO: <25 IU/mL (ref <409)

## 2014-01-04 ENCOUNTER — Telehealth: Payer: Self-pay

## 2014-01-04 NOTE — Telephone Encounter (Signed)
Pt Matthew Simon on my VM asking me to let Dr Everlene Farrier know that he is much improved with the Rxs he Rxd and with the visit to ortho. He also would like to schedule an appt to see Dr Everlene Farrier. Dr Everlene Farrier, [facility] [place]ier. Scheduling, can you please call pt to set up an appt [phone]?

## 2014-01-08 NOTE — Telephone Encounter (Signed)
Pt has an appt scheduled with Dr. Everlene Farrier in January.

## 2014-01-20 ENCOUNTER — Other Ambulatory Visit: Payer: Self-pay | Admitting: Family Medicine

## 2014-03-01 ENCOUNTER — Encounter: Payer: Self-pay | Admitting: Emergency Medicine

## 2014-03-13 ENCOUNTER — Ambulatory Visit (INDEPENDENT_AMBULATORY_CARE_PROVIDER_SITE_OTHER): Payer: Self-pay | Admitting: Emergency Medicine

## 2014-03-13 ENCOUNTER — Encounter: Payer: Self-pay | Admitting: Emergency Medicine

## 2014-03-13 VITALS — BP 127/79 | HR 80 | Temp 98.0°F | Resp 16 | Ht 70.0 in | Wt 178.4 lb

## 2014-03-13 DIAGNOSIS — E119 Type 2 diabetes mellitus without complications: Secondary | ICD-10-CM

## 2014-03-13 DIAGNOSIS — I10 Essential (primary) hypertension: Secondary | ICD-10-CM

## 2014-03-13 DIAGNOSIS — M109 Gout, unspecified: Secondary | ICD-10-CM

## 2014-03-13 LAB — COMPLETE METABOLIC PANEL WITH GFR
ALK PHOS: 45 U/L (ref 39–117)
ALT: 10 U/L (ref 0–53)
AST: 16 U/L (ref 0–37)
Albumin: 4.4 g/dL (ref 3.5–5.2)
BUN: 18 mg/dL (ref 6–23)
CALCIUM: 9.8 mg/dL (ref 8.4–10.5)
CO2: 28 mEq/L (ref 19–32)
CREATININE: 1.1 mg/dL (ref 0.50–1.35)
Chloride: 105 mEq/L (ref 96–112)
GFR, EST NON AFRICAN AMERICAN: 71 mL/min
GFR, Est African American: 82 mL/min
Glucose, Bld: 98 mg/dL (ref 70–99)
Potassium: 4.1 mEq/L (ref 3.5–5.3)
Sodium: 142 mEq/L (ref 135–145)
Total Bilirubin: 0.4 mg/dL (ref 0.2–1.2)
Total Protein: 7.4 g/dL (ref 6.0–8.3)

## 2014-03-13 LAB — URIC ACID: URIC ACID, SERUM: 4.2 mg/dL (ref 4.0–7.8)

## 2014-03-13 LAB — POCT GLYCOSYLATED HEMOGLOBIN (HGB A1C): Hemoglobin A1C: 4.7

## 2014-03-13 LAB — GLUCOSE, POCT (MANUAL RESULT ENTRY): POC Glucose: 99 mg/dl (ref 70–99)

## 2014-03-13 NOTE — Progress Notes (Signed)
Subjective:  This chart was scribed for Matthew Russian, MD by Ladene Artist, ED Scribe. The patient was seen in room 21. Patient's care was started at 12:28 PM.   Patient ID: Matthew Simon, male    DOB: 03-10-1950, 64 y.o.   MRN: [id number]  Chief Complaint  Patient presents with  . Diabetes  . Hypertension   HPI HPI Comments: Matthew Simon is a 64 y.o. male, with a h/o HTN, who presents [facility] for a follow-up regarding HTN and DM. Pt is currently taking Lisinopril, ASA and Allopurinol. Pt no longer takes Metformin.  Gout Pt states that he has not had another gout flare-up since he was last seen. He has been treating with Allopurinol, cherry extract and diet change with improvement. Pt states that he does not eat shellfish and only consumes a glass of wine with dinner.   Knee Pain Pt reports intermittent R knee pain. He states that pain is less severe than it was before.   Pt travels to [place] tomorrow for a project. He reports that he did not get the [employer].   Pt is currently looking at insurance plans.   Past Medical History  Diagnosis Date  . Hypertension    Current Outpatient Prescriptions on File Prior to Visit  Medication Sig Dispense Refill  . aspirin 81 MG tablet Take 81 mg by mouth daily.    Marland Kitchen lisinopril-hydrochlorothiazide (PRINZIDE,ZESTORETIC) 20-12.5 MG per tablet Take 1 tablet by mouth daily. 30 tablet 4  . calcium & magnesium carbonates (MYLANTA) 311-232 MG per tablet Take 1 tablet by mouth daily.    . colchicine 0.6 MG tablet Take 1 tablet (0.6 mg total) by mouth 2 (two) times daily. (Patient not taking: Reported on 03/13/2014) 20 tablet 0  . HYDROcodone-acetaminophen (NORCO) 5-325 MG per tablet Take 1 tablet by mouth every 6 (six) hours as needed. (Patient not taking: Reported on 03/13/2014) 20 tablet 0  . ketoconazole (NIZORAL) 2 % cream APPLY TO FEET FOR FUNGUS TWICE A DAY (Patient not taking: Reported on 03/13/2014) 120 g 2  .  meloxicam (MOBIC) 15 MG tablet Take 1 tablet (15 mg total) by mouth daily. (Patient not taking: Reported on 03/13/2014) 20 tablet 0  . metFORMIN (GLUCOPHAGE) 500 MG tablet Take 1 tablet (500 mg total) by mouth daily. PATIENT NEEDS OFFICE VISIT FOR ADDITIONAL REFILLS - 2nd NOTICE (Patient not taking: Reported on 03/13/2014) 30 tablet 1  . methylPREDNISolone (MEDROL) 4 MG tablet 2 daily (Patient not taking: Reported on 03/13/2014) 6 tablet 0  . predniSONE (DELTASONE) 10 MG tablet Take 4 day for 3 days 3 a day for 3 days 2 a day for 3 days one a day for 3 days (Patient not taking: Reported on 03/13/2014) 30 tablet 0   No current facility-administered medications on file prior to visit.   No Known Allergies  Review of Systems     Objective:   Physical Exam CONSTITUTIONAL: Well developed/well nourished HEAD: Normocephalic/atraumatic EYES: EOMI/PERRL ENMT: Mucous membranes moist NECK: supple no meningeal signs SPINE/BACK:entire spine nontender CV: S1/S2 noted, no murmurs/rubs/gallops noted LUNGS: Lungs are clear to auscultation bilaterally, no apparent distress ABDOMEN: soft, nontender, no rebound or guarding, bowel sounds noted throughout abdomen GU:no cva tenderness NEURO: Pt is awake/alert/appropriate, moves all extremitiesx4.  No facial droop.   EXTREMITIES: pulses normal/equal, full ROM, large calcified area over the R radial styloid, changes of gout of the great toes on both feet SKIN: warm, color normal PSYCH:  no abnormalities of mood noted, alert and oriented to situation    Results for orders placed or performed in visit on 03/13/14  POCT glucose (manual entry)  Result Value Ref Range   POC Glucose 99 70 - 99 mg/dl  POCT glycosylated hemoglobin (Hb A1C)  Result Value Ref Range   Hemoglobin A1C 4.7    Assessment & Plan:  Pt is doing well on diet, limited alcohol intakes, cherry extract and allopurinol. No severe flares of gout. Plan to do a full physical in the summer when he has  Medicare or insurance. Patient is doing great A1c 4.7 will schedule physical exam once his insurance situation is clarified.I personally performed the services described in this documentation, which was scribed in my presence. The recorded information has been reviewed and is accurate.

## 2014-03-14 ENCOUNTER — Telehealth: Payer: Self-pay

## 2014-03-14 NOTE — Telephone Encounter (Signed)
Pt called concerning a CB he missed about his lab results. Please advise [phone]

## 2014-03-14 NOTE — Telephone Encounter (Signed)
Called pt, his labs were normal. Pt understood.

## 2014-03-17 ENCOUNTER — Encounter: Payer: Self-pay | Admitting: *Deleted

## 2014-03-26 ENCOUNTER — Other Ambulatory Visit: Payer: Self-pay

## 2014-03-26 ENCOUNTER — Other Ambulatory Visit: Payer: Self-pay | Admitting: Emergency Medicine

## 2014-03-26 MED ORDER — HYDROCODONE-ACETAMINOPHEN 5-325 MG PO TABS: 1.0000 | ORAL_TABLET | Freq: Four times a day (QID) | ORAL | Status: DC | PRN

## 2014-03-26 NOTE — Telephone Encounter (Signed)
Done

## 2014-03-26 NOTE — Telephone Encounter (Signed)
Pt of Dr. Everlene Farrier requesting refill on HYDROcodone-acetaminophen [facility]) 5-325 MG per tablet [[id number]]

## 2014-03-27 ENCOUNTER — Other Ambulatory Visit: Payer: Self-pay | Admitting: Emergency Medicine

## 2014-03-27 ENCOUNTER — Telehealth: Payer: Self-pay | Admitting: Emergency Medicine

## 2014-03-27 MED ORDER — PREDNISONE 10 MG PO TABS: ORAL_TABLET | ORAL | Status: DC

## 2014-03-27 NOTE — Telephone Encounter (Signed)
I received a telephone call from Matthew Simon  that he was having an severe flare of his gout. I requested he notify Dr. Gladstone Lighter  to see about joint injection. A prednisone taper was called in as well as a written prescription for hydrocodone was left at check in.

## 2014-03-27 NOTE — Telephone Encounter (Signed)
Notified pt ready. 

## 2014-05-07 ENCOUNTER — Telehealth: Payer: Self-pay | Admitting: Emergency Medicine

## 2014-05-07 DIAGNOSIS — M1000 Idiopathic gout, unspecified site: Secondary | ICD-10-CM

## 2014-05-07 MED ORDER — INDOMETHACIN 50 MG PO CAPS: 50.0000 mg | ORAL_CAPSULE | Freq: Three times a day (TID) | ORAL | Status: DC

## 2014-05-07 MED ORDER — COLCHICINE 0.6 MG PO TABS: 0.6000 mg | ORAL_TABLET | Freq: Every day | ORAL | Status: DC

## 2014-05-07 NOTE — Telephone Encounter (Signed)
Will notify pt

## 2014-05-07 NOTE — Telephone Encounter (Addendum)
Patient states that he is having a gout attack in his right wrist. He is unable to write he states. He wants a medication called in to his pharmacy TODAY if possible. Dr. Everlene Farrier is out office until Wednesday. Can someone else handle? [facility].  [phone]

## 2014-05-07 NOTE — Telephone Encounter (Signed)
Sent in indomethacin and colchicine to pharmacy.

## 2014-05-07 NOTE — Telephone Encounter (Signed)
Patient called again crying about his gout. He states that he cannot take the pain and would like something called ASAP. Please help him  [phone]

## 2014-05-08 ENCOUNTER — Other Ambulatory Visit: Payer: Self-pay | Admitting: Radiology

## 2014-05-08 ENCOUNTER — Other Ambulatory Visit: Payer: Self-pay | Admitting: Emergency Medicine

## 2014-05-08 DIAGNOSIS — M1000 Idiopathic gout, unspecified site: Secondary | ICD-10-CM

## 2014-05-08 MED ORDER — HYDROCODONE-ACETAMINOPHEN 5-325 MG PO TABS: 1.0000 | ORAL_TABLET | Freq: Four times a day (QID) | ORAL | Status: DC | PRN

## 2014-05-08 MED ORDER — COLCHICINE 0.6 MG PO TABS: 0.6000 mg | ORAL_TABLET | Freq: Two times a day (BID) | ORAL | Status: DC

## 2014-05-08 NOTE — Telephone Encounter (Signed)
lmom to cb. 

## 2014-05-08 NOTE — Telephone Encounter (Signed)
Please call patient. I want to get him in as soon as possible to see one of the rheumatologist to get some help with his persistent gout. He is certainly welcome to come into the [facility] clinic for evaluation. I will be there tomorrow or he can come in today and see one of the other providers.

## 2014-05-10 NOTE — Telephone Encounter (Signed)
lmom to cb. 

## 2014-05-11 NOTE — Telephone Encounter (Signed)
Left message for pt to call back  °

## 2014-07-14 ENCOUNTER — Other Ambulatory Visit: Payer: Self-pay | Admitting: Family Medicine

## 2014-09-15 ENCOUNTER — Other Ambulatory Visit: Payer: Self-pay | Admitting: Family Medicine

## 2014-11-04 ENCOUNTER — Other Ambulatory Visit: Payer: Self-pay | Admitting: Physician Assistant

## 2014-12-19 ENCOUNTER — Emergency Department (HOSPITAL_COMMUNITY)
Admission: EM | Admit: 2014-12-19 | Discharge: 2014-12-19 | Disposition: A | Discharge status: Home / Self Care | Payer: Medicare Other | Attending: Emergency Medicine | Admitting: Emergency Medicine

## 2014-12-19 ENCOUNTER — Telehealth: Payer: Self-pay | Admitting: Emergency Medicine

## 2014-12-19 ENCOUNTER — Encounter (HOSPITAL_COMMUNITY): Payer: Self-pay | Admitting: Emergency Medicine

## 2014-12-19 DIAGNOSIS — R339 Retention of urine, unspecified: Secondary | ICD-10-CM | POA: Diagnosis present

## 2014-12-19 DIAGNOSIS — N390 Urinary tract infection, site not specified: Secondary | ICD-10-CM | POA: Diagnosis not present

## 2014-12-19 DIAGNOSIS — Z79899 Other long term (current) drug therapy: Secondary | ICD-10-CM | POA: Diagnosis not present

## 2014-12-19 DIAGNOSIS — Z87891 Personal history of nicotine dependence: Secondary | ICD-10-CM | POA: Insufficient documentation

## 2014-12-19 DIAGNOSIS — Z7982 Long term (current) use of aspirin: Secondary | ICD-10-CM | POA: Diagnosis not present

## 2014-12-19 DIAGNOSIS — I10 Essential (primary) hypertension: Secondary | ICD-10-CM | POA: Insufficient documentation

## 2014-12-19 LAB — BASIC METABOLIC PANEL
Anion gap: 8 (ref 5–15)
BUN: 15 mg/dL (ref 6–20)
CALCIUM: 8.7 mg/dL — AB (ref 8.9–10.3)
CO2: 25 mmol/L (ref 22–32)
CREATININE: 1.39 mg/dL — AB (ref 0.61–1.24)
Chloride: 102 mmol/L (ref 101–111)
GFR calc Af Amer: 60 mL/min — ABNORMAL LOW (ref >60)
GFR, EST NON AFRICAN AMERICAN: 52 mL/min — AB (ref >60)
GLUCOSE: 121 mg/dL — AB (ref 65–99)
Potassium: 3.3 mmol/L — ABNORMAL LOW (ref 3.5–5.1)
SODIUM: 135 mmol/L (ref 135–145)

## 2014-12-19 LAB — CBC WITH DIFFERENTIAL/PLATELET
BASOS ABS: 0 10*3/uL (ref 0.0–0.1)
BASOS PCT: 0 %
EOS ABS: 0 10*3/uL (ref 0.0–0.7)
EOS PCT: 0 %
HCT: 35.1 % — ABNORMAL LOW (ref 39.0–52.0)
Hemoglobin: 11.4 g/dL — ABNORMAL LOW (ref 13.0–17.0)
LYMPHS PCT: 9 %
Lymphs Abs: 0.9 10*3/uL (ref 0.7–4.0)
MCH: 28.9 pg (ref 26.0–34.0)
MCHC: 32.5 g/dL (ref 30.0–36.0)
MCV: 88.9 fL (ref 78.0–100.0)
MONO ABS: 0.7 10*3/uL (ref 0.1–1.0)
Monocytes Relative: 7 %
Neutro Abs: 8.3 10*3/uL — ABNORMAL HIGH (ref 1.7–7.7)
Neutrophils Relative %: 84 %
PLATELETS: 223 10*3/uL (ref 150–400)
RBC: 3.95 MIL/uL — AB (ref 4.22–5.81)
RDW: 12.9 % (ref 11.5–15.5)
WBC: 9.9 10*3/uL (ref 4.0–10.5)

## 2014-12-19 LAB — URINALYSIS, ROUTINE W REFLEX MICROSCOPIC
BILIRUBIN URINE: NEGATIVE
Glucose, UA: NEGATIVE mg/dL
KETONES UR: NEGATIVE mg/dL
Nitrite: POSITIVE — AB
Protein, ur: 30 mg/dL — AB
SPECIFIC GRAVITY, URINE: 1.012 (ref 1.005–1.030)
pH: 6 (ref 5.0–8.0)

## 2014-12-19 LAB — URINE MICROSCOPIC-ADD ON

## 2014-12-19 MED ORDER — DEXTROSE 5 % IV SOLN: 1.0000 g | Freq: Once | INTRAVENOUS | Status: DC

## 2014-12-19 MED ORDER — CEFTRIAXONE SODIUM 1 G IJ SOLR
1.0000 g | Freq: Once | INTRAMUSCULAR | Status: AC
  Administered 2014-12-19: 1 g via INTRAMUSCULAR
  Filled 2014-12-19: qty 10

## 2014-12-19 MED ORDER — AMOXICILLIN-POT CLAVULANATE 875-125 MG PO TABS: 1.0000 | ORAL_TABLET | Freq: Two times a day (BID) | ORAL | Status: DC

## 2014-12-19 MED ORDER — PHENAZOPYRIDINE HCL 200 MG PO TABS: 200.0000 mg | ORAL_TABLET | Freq: Three times a day (TID) | ORAL | Status: DC

## 2014-12-19 MED ORDER — LEVOFLOXACIN 500 MG PO TABS
500.0000 mg | ORAL_TABLET | Freq: Once | ORAL | Status: AC
  Administered 2014-12-19: 500 mg via ORAL
  Filled 2014-12-19: qty 1

## 2014-12-19 MED ORDER — LEVOFLOXACIN 500 MG PO TABS
500.0000 mg | ORAL_TABLET | Freq: Every day | ORAL | Status: DC
Start: 2014-12-19 — End: 2014-12-19

## 2014-12-19 MED ORDER — PHENAZOPYRIDINE HCL 200 MG PO TABS
200.0000 mg | ORAL_TABLET | Freq: Three times a day (TID) | ORAL | Status: DC
  Administered 2014-12-19: 200 mg via ORAL
  Filled 2014-12-19: qty 1

## 2014-12-19 MED ORDER — LIDOCAINE HCL (PF) 1 % IJ SOLN
INTRAMUSCULAR | Status: AC
  Administered 2014-12-19: 2.1 mL
  Filled 2014-12-19: qty 5

## 2014-12-19 NOTE — ED Provider Notes (Signed)
CSN: [id number]     Arrival date & time 12/19/14  0706 History   First MD Initiated Contact with Patient 12/19/14 [phone]     Chief Complaint  Patient presents with  . Urinary Retention     (Consider location/radiation/quality/duration/timing/severity/associated sxs/prior Treatment) HPI He has been having difficulty urinating for 2 days. This started on Monday. When he tries to urinate, he has urgency and only very small amounts of cloudy malodorous urine, out. He denies significant suprapubic pain or burning. He denies testicular pain. No fevers, no chills, no flank pain. Patient denies history of urinary tract infection. He has no known history of prostatic enlargement. He does report at baseline he has to get up to urinate a couple times during the night. Past Medical History  Diagnosis Date  . Hypertension    History reviewed. No pertinent past surgical history. History reviewed. No pertinent family history. Social History  Substance Use Topics  . Smoking status: Former Smoker    Quit date: 05/10/1964  . Smokeless tobacco: Never Used  . Alcohol Use: 10.5 oz/week    21 drink(s) per week    Review of Systems   10 Systems reviewed and are negative for acute change except as noted in the HPI.  Allergies  Review of patient's allergies indicates no known allergies.  Home Medications   Prior to Admission medications   Medication Sig Start Date End Date Taking? Authorizing Provider  allopurinol (ZYLOPRIM) 300 MG tablet Take 300 mg by mouth daily. 03/03/14  Yes Historical Provider, MD  aspirin 81 MG tablet Take 81 mg by mouth 2 (two) times a week.    Yes Historical Provider, MD  HYDROcodone-acetaminophen (NORCO) 5-325 MG per tablet Take 1 tablet by mouth every 6 (six) hours as needed. Patient taking differently: Take 1 tablet by mouth every 6 (six) hours as needed for moderate pain.  05/08/14  Yes [name], MD  ketoconazole (NIZORAL) 2 % cream APPLY TO FEET FOR FUNGUS TWICE A  DAY Patient taking differently: Apply 1 application topically 2 (two) times daily as needed for irritation (feet fungus).  09/02/12  Yes Gay Filler Copland, MD  lisinopril-hydrochlorothiazide (PRINZIDE,ZESTORETIC) 20-12.5 MG tablet Take 1 tablet by mouth daily. NO MORE REFILLS WITHOUT OFFICE VISIT - 2ND NOTICE 11/05/14  Yes [name], MD  amoxicillin-clavulanate (AUGMENTIN) 875-125 MG tablet Take 1 tablet by mouth 2 (two) times daily. One po bid x 7 days 12/19/14   Charlesetta Shanks, MD  colchicine 0.6 MG tablet Take 1 tablet (0.6 mg total) by mouth 2 (two) times daily. Patient not taking: Reported on 03/13/2014 12/27/13   [name], MD  colchicine 0.6 MG tablet Take 1 tablet (0.6 mg total) by mouth 2 (two) times daily. Patient not taking: Reported on 12/19/2014 05/08/14   [name], MD  indomethacin (INDOCIN) 50 MG capsule Take 1 capsule (50 mg total) by mouth 3 (three) times daily with meals. Please take the medicine three times per day until the pain has resolved. Please then continue to take the pills three times per day for 2 more days after the pain is gone. Patient not taking: Reported on 12/19/2014 05/07/14   Araceli Bouche, PA  metFORMIN (GLUCOPHAGE) 500 MG tablet Take 1 tablet (500 mg total) by mouth daily. PATIENT NEEDS OFFICE VISIT FOR ADDITIONAL REFILLS - 2nd NOTICE Patient not taking: Reported on 03/13/2014 05/19/13   Darreld Mclean, MD  phenazopyridine (PYRIDIUM) 200 MG tablet Take 1 tablet (200 mg total) by mouth  3 (three) times daily. 12/19/14   Charlesetta Shanks, MD   BP 155/82 mmHg  Pulse 81  Temp(Src) 98.7 F (37.1 C) (Oral)  Resp 18  SpO2 97% Physical Exam  Constitutional: He is oriented to person, place, and time. He appears well-developed and well-nourished. No distress.  HENT:  Head: Normocephalic and atraumatic.  Mouth/Throat: Oropharynx is clear and moist.  Eyes: EOM are normal. Pupils are equal, round, and reactive to light.  Cardiovascular:  Heart rate is regular with  occasional ectopic beat. No rub or murmur.  Pulmonary/Chest: Effort normal and breath sounds normal.  Abdominal: Soft. Bowel sounds are normal. He exhibits no distension and no mass. There is no tenderness. There is no rebound and no guarding.  Genitourinary: Prostate normal and penis normal.  No testicular tenderness. No scrotal edema.  Musculoskeletal: Normal range of motion. He exhibits no edema.  Neurological: He is alert and oriented to person, place, and time. Coordination normal.  Skin: Skin is warm and dry.  Psychiatric: He has a normal mood and affect.    ED Course  Procedures (including critical care time) Labs Review Labs Reviewed  URINALYSIS, ROUTINE W REFLEX MICROSCOPIC (NOT AT [facility]) - Abnormal; Notable for the following:    APPearance TURBID (*)    Hgb urine dipstick MODERATE (*)    Protein, ur 30 (*)    Nitrite POSITIVE (*)    Leukocytes, UA LARGE (*)    All other components within normal limits  BASIC METABOLIC PANEL - Abnormal; Notable for the following:    Potassium 3.3 (*)    Glucose, Bld 121 (*)    Creatinine, Ser 1.39 (*)    Calcium 8.7 (*)    GFR calc non Af Amer 52 (*)    GFR calc Af Amer 60 (*)    All other components within normal limits  CBC WITH DIFFERENTIAL/PLATELET - Abnormal; Notable for the following:    RBC 3.95 (*)    Hemoglobin 11.4 (*)    HCT 35.1 (*)    Neutro Abs 8.3 (*)    All other components within normal limits  URINE MICROSCOPIC-ADD ON - Abnormal; Notable for the following:    Squamous Epithelial / LPF 0-5 (*)    Bacteria, UA MANY (*)    All other components within normal limits  URINE CULTURE    Imaging Review No results found. I have personally reviewed and evaluated these images and lab results as part of my medical decision-making.   EKG Interpretation None      MDM   Final diagnoses:  UTI (lower urinary tract infection)   Patient has grossly positive urine. Symptoms are consistent with UTI. He does not have flank  pain, fever, leukocytosis to suggest pyelonephritis. Prostate exam is nontender. Patient will have urine culture pending. He'll be given a dose of Levaquin and Augmentin in the emergency department to expand coverage of urinary pathogens. Instructions are to follow-up with his family physician for 24 hours of the culture sensitivities. He will be discharged on Augmentin. Patient is given instructions on signs and symptoms which are return such as fever, flank pain, worsening condition or abdominal pain.    Charlesetta Shanks, MD 12/19/14 [phone]

## 2014-12-19 NOTE — Telephone Encounter (Signed)
I called and spoke with patient. He is having problems for the last 36 hours with urinary retention. Problem started on Monday with an odor to his return. Patient advised to go to the emergency room at [facility] for catheter drainage and further evaluation. Patient understands.

## 2014-12-19 NOTE — ED Notes (Signed)
Pt states he's been unable to void since Monday. States no abd pain, but he gets the urge to void with only a small amount of urine coming out.

## 2014-12-19 NOTE — Discharge Instructions (Signed)

## 2014-12-21 LAB — URINE CULTURE: Culture: >=100000

## 2014-12-23 ENCOUNTER — Telehealth (HOSPITAL_COMMUNITY): Payer: Self-pay

## 2014-12-23 NOTE — Telephone Encounter (Signed)
Post ED Visit - Positive Culture Follow-up  Culture report reviewed by antimicrobial stewardship pharmacist:  []  Elenor Quinones, Pharm.D. []  Heide Guile, Pharm.D., BCPS []  Parks Neptune, Pharm.D. []  Alycia Rossetti, Pharm.D., BCPS []  [place], Pharm.D., BCPS, AAHIVP []  Legrand Como, Pharm.D., BCPS, AAHIVP [x]  Milus Glazier, Pharm.D. []  Stephens November, Pharm.D.  Positive urine culture, >/= 100,000 colonies -> E Coli Treated with Levofloxacin, organism sensitive to the same and no further patient follow-up is required at this time.  Dortha Kern 12/23/2014, 4:54 AM

## 2014-12-24 ENCOUNTER — Ambulatory Visit (INDEPENDENT_AMBULATORY_CARE_PROVIDER_SITE_OTHER): Payer: Medicare Other | Admitting: Emergency Medicine

## 2014-12-24 VITALS — BP 136/82 | HR 66 | Temp 98.1°F | Resp 18 | Ht 70.0 in | Wt 184.0 lb

## 2014-12-24 DIAGNOSIS — M109 Gout, unspecified: Secondary | ICD-10-CM | POA: Diagnosis not present

## 2014-12-24 DIAGNOSIS — Z23 Encounter for immunization: Secondary | ICD-10-CM

## 2014-12-24 DIAGNOSIS — D638 Anemia in other chronic diseases classified elsewhere: Secondary | ICD-10-CM

## 2014-12-24 DIAGNOSIS — N400 Enlarged prostate without lower urinary tract symptoms: Secondary | ICD-10-CM | POA: Diagnosis not present

## 2014-12-24 DIAGNOSIS — N390 Urinary tract infection, site not specified: Secondary | ICD-10-CM | POA: Diagnosis not present

## 2014-12-24 DIAGNOSIS — I10 Essential (primary) hypertension: Secondary | ICD-10-CM | POA: Diagnosis not present

## 2014-12-24 DIAGNOSIS — M1000 Idiopathic gout, unspecified site: Secondary | ICD-10-CM | POA: Diagnosis not present

## 2014-12-24 DIAGNOSIS — Z125 Encounter for screening for malignant neoplasm of prostate: Secondary | ICD-10-CM

## 2014-12-24 DIAGNOSIS — E119 Type 2 diabetes mellitus without complications: Secondary | ICD-10-CM

## 2014-12-24 DIAGNOSIS — E785 Hyperlipidemia, unspecified: Secondary | ICD-10-CM

## 2014-12-24 DIAGNOSIS — E1169 Type 2 diabetes mellitus with other specified complication: Secondary | ICD-10-CM

## 2014-12-24 LAB — POCT URINALYSIS DIP (MANUAL ENTRY)
BILIRUBIN UA: NEGATIVE
BILIRUBIN UA: NEGATIVE
Glucose, UA: NEGATIVE
Leukocytes, UA: NEGATIVE
Nitrite, UA: NEGATIVE
PH UA: 6
Protein Ur, POC: NEGATIVE
RBC UA: NEGATIVE
SPEC GRAV UA: 1.02
Urobilinogen, UA: 0.2

## 2014-12-24 LAB — COMPLETE METABOLIC PANEL WITH GFR
ALBUMIN: 3.9 g/dL (ref 3.6–5.1)
ALK PHOS: 47 U/L (ref 40–115)
ALT: 19 U/L (ref 9–46)
AST: 19 U/L (ref 10–35)
BUN: 15 mg/dL (ref 7–25)
CO2: 27 mmol/L (ref 20–31)
Calcium: 9.4 mg/dL (ref 8.6–10.3)
Chloride: 106 mmol/L (ref 98–110)
Creat: 1.07 mg/dL (ref 0.70–1.25)
GFR, Est African American: 84 mL/min (ref >=60)
GFR, Est Non African American: 72 mL/min (ref >=60)
GLUCOSE: 92 mg/dL (ref 65–99)
POTASSIUM: 4.7 mmol/L (ref 3.5–5.3)
SODIUM: 143 mmol/L (ref 135–146)
TOTAL PROTEIN: 6.5 g/dL (ref 6.1–8.1)
Total Bilirubin: 0.4 mg/dL (ref 0.2–1.2)

## 2014-12-24 LAB — URIC ACID: Uric Acid, Serum: 3.8 mg/dL — ABNORMAL LOW (ref 4.0–7.8)

## 2014-12-24 LAB — POC MICROSCOPIC URINALYSIS (UMFC): Mucus: ABSENT

## 2014-12-24 LAB — LIPID PANEL
Cholesterol: 143 mg/dL (ref 125–200)
HDL: 39 mg/dL — AB (ref >=40)
LDL CALC: 86 mg/dL (ref <130)
Total CHOL/HDL Ratio: 3.7 Ratio (ref <=5.0)
Triglycerides: 88 mg/dL (ref <150)
VLDL: 18 mg/dL (ref <30)

## 2014-12-24 LAB — POCT GLYCOSYLATED HEMOGLOBIN (HGB A1C): HEMOGLOBIN A1C: 4.7

## 2014-12-24 LAB — GLUCOSE, POCT (MANUAL RESULT ENTRY): POC GLUCOSE: 104 mg/dL — AB (ref 70–99)

## 2014-12-24 LAB — HEMOGLOBIN A1C: Hgb A1c MFr Bld: 4.7 % (ref 4.0–6.0)

## 2014-12-24 NOTE — Progress Notes (Signed)
This chart was scribed for [name], MD by Thea Alken, ED Scribe. This patient was seen in room 5 and the patient's care was started at 12:14 PM.  Chief Complaint:  Chief Complaint  Patient presents with  . Medication Refill    nizoral, lisinopril/hctz  . Flu Vaccine  . Follow-up    htn    HPI: Matthew Simon is a 64 y.o. male who reports to [facility] today for a follow up. He was seen at [facility] ED 5 days ago, for urinary retention that began 2 days prior. He was prescribed Augmentin which he has been taking. States his prostate exam in the ED was normal. Pt denies dysuria at this time.   He would also like a medication refill of lisinopril HCTZ and nizoral.   Past Medical History  Diagnosis Date  . Hypertension    No past surgical history on file. Social History   Social History  . Marital Status: Married    Spouse Name: N/A  . Number of Children: N/A  . Years of Education: N/A   Social History Main Topics  . Smoking status: Former Smoker    Quit date: 05/10/1964  . Smokeless tobacco: Never Used  . Alcohol Use: 10.5 oz/week    21 drink(s) per week  . Drug Use: No  . Sexual Activity: Not Asked   Other Topics Concern  . None   Social History Narrative   No family history on file. No Known Allergies Prior to Admission medications   Medication Sig Start Date End Date Taking? Authorizing Provider  allopurinol (ZYLOPRIM) 300 MG tablet Take 300 mg by mouth daily. 03/03/14   Historical Provider, MD  amoxicillin-clavulanate (AUGMENTIN) 875-125 MG tablet Take 1 tablet by mouth 2 (two) times daily. One po bid x 7 days 12/19/14   Charlesetta Shanks, MD  aspirin 81 MG tablet Take 81 mg by mouth 2 (two) times a week.     Historical Provider, MD  colchicine 0.6 MG tablet Take 1 tablet (0.6 mg total) by mouth 2 (two) times daily. Patient not taking: Reported on 03/13/2014 12/27/13   [name], MD  colchicine 0.6 MG tablet Take 1 tablet (0.6 mg total) by mouth 2 (two) times  daily. Patient not taking: Reported on 12/19/2014 05/08/14   [name], MD  HYDROcodone-acetaminophen (NORCO) 5-325 MG per tablet Take 1 tablet by mouth every 6 (six) hours as needed. Patient taking differently: Take 1 tablet by mouth every 6 (six) hours as needed for moderate pain.  05/08/14   [name], MD  indomethacin (INDOCIN) 50 MG capsule Take 1 capsule (50 mg total) by mouth 3 (three) times daily with meals. Please take the medicine three times per day until the pain has resolved. Please then continue to take the pills three times per day for 2 more days after the pain is gone. Patient not taking: Reported on 12/19/2014 05/07/14   Araceli Bouche, PA  ketoconazole (NIZORAL) 2 % cream APPLY TO FEET FOR FUNGUS TWICE A DAY Patient taking differently: Apply 1 application topically 2 (two) times daily as needed for irritation (feet fungus).  09/02/12   Gay Filler Copland, MD  lisinopril-hydrochlorothiazide (PRINZIDE,ZESTORETIC) 20-12.5 MG tablet Take 1 tablet by mouth daily. NO MORE REFILLS WITHOUT OFFICE VISIT - 2ND NOTICE 11/05/14   [name], MD  metFORMIN (GLUCOPHAGE) 500 MG tablet Take 1 tablet (500 mg total) by mouth daily. PATIENT NEEDS OFFICE VISIT FOR ADDITIONAL REFILLS - 2nd NOTICE Patient  not taking: Reported on 03/13/2014 05/19/13   Darreld Mclean, MD  phenazopyridine (PYRIDIUM) 200 MG tablet Take 1 tablet (200 mg total) by mouth 3 (three) times daily. 12/19/14   Charlesetta Shanks, MD     ROS: The patient denies fevers, chills, night sweats, unintentional weight loss, chest pain, palpitations, wheezing, dyspnea on exertion, nausea, vomiting, abdominal pain, dysuria, hematuria, melena, numbness, weakness, or tingling.   All other systems have been reviewed and were otherwise negative with the exception of those mentioned in the HPI and as above.    PHYSICAL EXAM: Filed Vitals:   12/24/14 1213  BP: 136/82  Pulse: 66  Temp: 98.1 F (36.7 C)  Resp: 18   Body mass index is 26.4  kg/(m^2).   General: Alert, no acute distress HEENT:  Normocephalic, atraumatic, oropharynx patent. Eye: [facility]te Mangle [facility] Cardiovascular:  Regular rate and rhythm, no rubs murmurs or gallops.  No Carotid bruits, radial pulse intact. No pedal edema.  Respiratory: Clear to auscultation bilaterally.  No wheezes, rales, or rhonchi.  No cyanosis, no use of accessory musculature Abdominal: No organomegaly, abdomen is soft and non-tender, positive bowel sounds.  No masses. Musculoskeletal: Gait intact. No edema, tenderness Skin: No rashes. Neurologic: Facial musculature symmetric. Psychiatric: Patient acts appropriately throughout our interaction. Lymphatic: No cervical or submandibular lymphadenopathy    LABS:  Results for orders placed or performed in visit on 12/24/14  POCT glucose (manual entry)  Result Value Ref Range   POC Glucose 104 (A) 70 - 99 mg/dl  POCT glycosylated hemoglobin (Hb A1C)  Result Value Ref Range   Hemoglobin A1C 4.7   POCT urinalysis dipstick  Result Value Ref Range   Color, UA yellow yellow   Clarity, UA clear clear   Glucose, UA negative negative   Bilirubin, UA negative negative   Ketones, POC UA negative negative   Spec Grav, UA 1.020    Blood, UA negative negative   pH, UA 6.0    Protein Ur, POC negative negative   Urobilinogen, UA 0.2    Nitrite, UA Negative Negative   Leukocytes, UA Negative Negative  POCT Microscopic Urinalysis ([facility])  Result Value Ref Range   WBC,UR,HPF,POC Few (A) None WBC/hpf   RBC,UR,HPF,POC None None RBC/hpf   Bacteria None None, Too numerous to count   Mucus Absent Absent   Epithelial Cells, UR Per Microscopy None None, Too numerous to count cells/hpf    EKG/XRAY:   Primary read interpreted by Dr. Everlene Farrier at [facility].   ASSESSMENT/PLAN: Labs are great. Patient  scheduled physical exam. He was advised to finish his antibiotics.I personally performed the services described in this documentation, which was scribed in my  presence. The recorded information has been reviewed and is accurate.I personally performed the services described in this documentation, which was scribed in my presence. The recorded information has been reviewed and is accurate.   Gross sideeffects, risk and benefits, and alternatives of medications d/w patient. Patient is aware that all medications have potential sideeffects and we are unable to predict every sideeffect or drug-drug interaction that may occur.  [name] MD 12/24/2014 12:14 PM

## 2014-12-25 ENCOUNTER — Other Ambulatory Visit: Payer: Self-pay | Admitting: Emergency Medicine

## 2014-12-25 DIAGNOSIS — R972 Elevated prostate specific antigen [PSA]: Secondary | ICD-10-CM

## 2014-12-25 LAB — PSA, MEDICARE: PSA: 20.47 ng/mL — AB (ref <=4.00)

## 2014-12-25 MED ORDER — LISINOPRIL-HYDROCHLOROTHIAZIDE 20-12.5 MG PO TABS: ORAL_TABLET | ORAL | Status: DC

## 2015-01-02 ENCOUNTER — Encounter: Payer: Self-pay | Admitting: Family Medicine

## 2015-01-04 DIAGNOSIS — N400 Enlarged prostate without lower urinary tract symptoms: Secondary | ICD-10-CM | POA: Diagnosis not present

## 2015-01-04 DIAGNOSIS — N410 Acute prostatitis: Secondary | ICD-10-CM | POA: Diagnosis not present

## 2015-01-04 DIAGNOSIS — R972 Elevated prostate specific antigen [PSA]: Secondary | ICD-10-CM | POA: Diagnosis not present

## 2015-02-05 DIAGNOSIS — R972 Elevated prostate specific antigen [PSA]: Secondary | ICD-10-CM | POA: Diagnosis not present

## 2015-02-07 ENCOUNTER — Telehealth: Payer: Self-pay

## 2015-02-07 NOTE — Telephone Encounter (Signed)
Patient states that he was told by Dr. Everlene Farrier to make an appointment with him for a cpe. There is no medicare cpe available by appointment until 5/2 and patient states that to far out. He asked if I could send a message so to see what his other options are. I offered to schedule patient with another provider and patient states he wants a physical with Dr. Everlene Farrier only within the next week.

## 2015-02-11 NOTE — Telephone Encounter (Signed)
Pt checking on status of this message. CB # [phone] (H)

## 2015-02-12 NOTE — Telephone Encounter (Signed)
Called pt, left detailed voicemail letting pt know.

## 2015-02-12 NOTE — Telephone Encounter (Signed)
Call patient I will have the staff work on getting him in some time within the next 2 weeks.

## 2015-02-21 ENCOUNTER — Ambulatory Visit (INDEPENDENT_AMBULATORY_CARE_PROVIDER_SITE_OTHER): Payer: Medicare Other | Admitting: Emergency Medicine

## 2015-02-21 ENCOUNTER — Encounter: Payer: Self-pay | Admitting: Emergency Medicine

## 2015-02-21 VITALS — BP 139/79 | HR 75 | Temp 98.0°F | Resp 16 | Ht 69.0 in | Wt 183.0 lb

## 2015-02-21 DIAGNOSIS — Z1211 Encounter for screening for malignant neoplasm of colon: Secondary | ICD-10-CM

## 2015-02-21 DIAGNOSIS — Z0000 Encounter for general adult medical examination without abnormal findings: Secondary | ICD-10-CM | POA: Diagnosis not present

## 2015-02-21 DIAGNOSIS — M109 Gout, unspecified: Secondary | ICD-10-CM | POA: Diagnosis not present

## 2015-02-21 DIAGNOSIS — Z23 Encounter for immunization: Secondary | ICD-10-CM

## 2015-02-21 DIAGNOSIS — R221 Localized swelling, mass and lump, neck: Secondary | ICD-10-CM

## 2015-02-21 DIAGNOSIS — I10 Essential (primary) hypertension: Secondary | ICD-10-CM | POA: Diagnosis not present

## 2015-02-21 DIAGNOSIS — M1000 Idiopathic gout, unspecified site: Secondary | ICD-10-CM | POA: Diagnosis not present

## 2015-02-21 DIAGNOSIS — N400 Enlarged prostate without lower urinary tract symptoms: Secondary | ICD-10-CM | POA: Diagnosis not present

## 2015-02-21 DIAGNOSIS — N390 Urinary tract infection, site not specified: Secondary | ICD-10-CM | POA: Diagnosis not present

## 2015-02-21 DIAGNOSIS — Z1159 Encounter for screening for other viral diseases: Secondary | ICD-10-CM

## 2015-02-21 LAB — CBC WITH DIFFERENTIAL/PLATELET
Basophils Absolute: 0 10*3/uL (ref 0.0–0.1)
Basophils Relative: 1 % (ref 0–1)
EOS ABS: 0 10*3/uL (ref 0.0–0.7)
Eosinophils Relative: 1 % (ref 0–5)
HEMATOCRIT: 40.4 % (ref 39.0–52.0)
Hemoglobin: 12.7 g/dL — ABNORMAL LOW (ref 13.0–17.0)
LYMPHS ABS: 1.6 10*3/uL (ref 0.7–4.0)
LYMPHS PCT: 38 % (ref 12–46)
MCH: 28.2 pg (ref 26.0–34.0)
MCHC: 31.4 g/dL (ref 30.0–36.0)
MCV: 89.8 fL (ref 78.0–100.0)
MONO ABS: 0.2 10*3/uL (ref 0.1–1.0)
MPV: 8.6 fL (ref 8.6–12.4)
Monocytes Relative: 6 % (ref 3–12)
Neutro Abs: 2.2 10*3/uL (ref 1.7–7.7)
Neutrophils Relative %: 54 % (ref 43–77)
Platelets: 304 10*3/uL (ref 150–400)
RBC: 4.5 MIL/uL (ref 4.22–5.81)
RDW: 13.3 % (ref 11.5–15.5)
WBC: 4.1 10*3/uL (ref 4.0–10.5)

## 2015-02-21 LAB — COMPLETE METABOLIC PANEL WITH GFR
ALBUMIN: 4.6 g/dL (ref 3.6–5.1)
ALK PHOS: 36 U/L — AB (ref 40–115)
ALT: 15 U/L (ref 9–46)
AST: 19 U/L (ref 10–35)
BILIRUBIN TOTAL: 0.5 mg/dL (ref 0.2–1.2)
BUN: 22 mg/dL (ref 7–25)
CO2: 27 mmol/L (ref 20–31)
CREATININE: 1.28 mg/dL — AB (ref 0.70–1.25)
Calcium: 9.9 mg/dL (ref 8.6–10.3)
Chloride: 104 mmol/L (ref 98–110)
GFR, EST AFRICAN AMERICAN: 67 mL/min (ref >=60)
GFR, Est Non African American: 58 mL/min — ABNORMAL LOW (ref >=60)
GLUCOSE: 90 mg/dL (ref 65–99)
Potassium: 4.2 mmol/L (ref 3.5–5.3)
SODIUM: 140 mmol/L (ref 135–146)
TOTAL PROTEIN: 7.2 g/dL (ref 6.1–8.1)

## 2015-02-21 LAB — POCT URINALYSIS DIP (MANUAL ENTRY)
BILIRUBIN UA: NEGATIVE
BILIRUBIN UA: NEGATIVE
GLUCOSE UA: NEGATIVE
Leukocytes, UA: NEGATIVE
Nitrite, UA: NEGATIVE
Protein Ur, POC: NEGATIVE
SPEC GRAV UA: 1.02
Urobilinogen, UA: 0.2
pH, UA: 5

## 2015-02-21 LAB — POC MICROSCOPIC URINALYSIS (UMFC): Mucus: ABSENT

## 2015-02-21 LAB — URIC ACID: URIC ACID, SERUM: 4.8 mg/dL (ref 4.0–7.8)

## 2015-02-21 MED ORDER — ZOSTER VACCINE LIVE 19400 UNT/0.65ML ~~LOC~~ SOLR: 0.6500 mL | Freq: Once | SUBCUTANEOUS | Status: DC

## 2015-02-21 MED ORDER — LISINOPRIL 20 MG PO TABS: 20.0000 mg | ORAL_TABLET | Freq: Every day | ORAL | Status: DC

## 2015-02-21 NOTE — Progress Notes (Addendum)
Subjective:  This chart was scribed for Matthew Russian, MD by Tamsen Roers, at [facility].  This patient was seen in room  21  and the patient's care was started at 12:14 PM.    Patient ID: Matthew Simon, male    DOB: Oct 06, 1950, 65 y.o.   MRN: [id number] Chief Complaint  Patient presents with  . Annual Exam    HPI  HPI Comments: Rickye Chai is a 65 y.o. male who presents to the [facility] for an annual physical exam.  He went in to get his prostate checked by Dr.Manny.  His level went down from 20 to 2 and was treated with antibiotics.  He states he has not been working out as much and gained 2 pounds but feels really good. He watches his diet and has not had any flares of gout.  Patient has wine with dinner every night which is something he has done for a long time.  He has not smoked since he was 70.   Blood pressure left arm: 120/80  Patient is currently applying for life insurance.    Patient travels to [place] often and works (to keep from being bored).    Patient Active Problem List   Diagnosis Date Noted  . Hypertension 04/10/2011  . Colon polyp 04/10/2011  . Type 2 diabetes mellitus ([place]) 04/10/2011   Past Medical History  Diagnosis Date  . Hypertension   . Gout    No past surgical history on file. No Known Allergies Prior to Admission medications   Medication Sig Start Date End Date Taking? Authorizing Provider  allopurinol (ZYLOPRIM) 300 MG tablet Take 300 mg by mouth daily. 03/03/14   Historical Provider, MD  amoxicillin-clavulanate (AUGMENTIN) 875-125 MG tablet Take 1 tablet by mouth 2 (two) times daily. One po bid x 7 days Patient not taking: Reported on 12/24/2014 12/19/14   Charlesetta Shanks, MD  aspirin 81 MG tablet Take 81 mg by mouth 2 (two) times a week.     Historical Provider, MD  colchicine 0.6 MG tablet Take 1 tablet (0.6 mg total) by mouth 2 (two) times daily. Patient not taking: Reported on 03/13/2014  12/27/13   Matthew Russian, MD  colchicine 0.6 MG tablet Take 1 tablet (0.6 mg total) by mouth 2 (two) times daily. Patient not taking: Reported on 12/19/2014 05/08/14   Matthew Russian, MD  HYDROcodone-acetaminophen (NORCO) 5-325 MG per tablet Take 1 tablet by mouth every 6 (six) hours as needed. Patient not taking: Reported on 12/24/2014 05/08/14   Matthew Russian, MD  indomethacin (INDOCIN) 50 MG capsule Take 1 capsule (50 mg total) by mouth 3 (three) times daily with meals. Please take the medicine three times per day until the pain has resolved. Please then continue to take the pills three times per day for 2 more days after the pain is gone. Patient not taking: Reported on 12/19/2014 05/07/14   Araceli Bouche, PA  ketoconazole (NIZORAL) 2 % cream APPLY TO FEET FOR FUNGUS TWICE A DAY Patient taking differently: Apply 1 application topically 2 (two) times daily as needed for irritation (feet fungus).  09/02/12   Darreld Mclean, MD  lisinopril-hydrochlorothiazide (PRINZIDE,ZESTORETIC) 20-12.5 MG tablet Sig 1 tablet daily 12/25/14   Matthew Russian, MD  metFORMIN (GLUCOPHAGE) 500 MG tablet Take 1 tablet (500 mg total) by mouth daily. PATIENT NEEDS OFFICE VISIT FOR ADDITIONAL REFILLS - 2nd NOTICE Patient not taking: Reported on 03/13/2014 05/19/13  Gay Filler Copland, MD  phenazopyridine (PYRIDIUM) 200 MG tablet Take 1 tablet (200 mg total) by mouth 3 (three) times daily. Patient not taking: Reported on 12/24/2014 12/19/14   Charlesetta Shanks, MD   Social History   Social History  . Marital Status: Married    Spouse Name: N/A  . Number of Children: N/A  . Years of Education: N/A   Occupational History  . business development    Social History Main Topics  . Smoking status: Former Smoker    Quit date: 05/10/1964  . Smokeless tobacco: Never Used  . Alcohol Use: 12.6 oz/week    21 Standard drinks or equivalent per week     Comment: wine with dinner  . Drug Use: No  . Sexual Activity: Not on file   Other  Topics Concern  . Not on file   Social History Narrative    Review of Systems  Constitutional: Negative for fever and chills.  Eyes: Negative for pain, redness and itching.  Respiratory: Negative for cough, choking and shortness of breath.   Gastrointestinal: Negative for nausea and vomiting.  Musculoskeletal: Negative for neck pain and neck stiffness.  Skin: Negative for color change.  Neurological: Negative for syncope and speech difficulty.       Objective:   Physical Exam Filed Vitals:   02/21/15 1158  BP: 139/79  Pulse: 75  Temp: 98 F (36.7 C)  TempSrc: Oral  Resp: 16  Height: 5\' 9"  (1.753 m)  Weight: 183 lb (83.008 kg)  SpO2: 97%     CONSTITUTIONAL: Well developed/well nourished HEAD: Normocephalic/atraumatic EYES: EOMI/PERRL, He has a mild ptosis of the right upper lid. ENMT: Mucous membranes moist NECK: supple no meningeal signs, There is a golf ball size mass on the back of the neck which is freely movable.  SPINE/BACK:entire spine nontender CV: S1/S2 noted, no murmurs/rubs/gallops noted LUNGS: Lungs are clear to auscultation bilaterally, no apparent distress ABDOMEN: He has a small umbilical hernia. GU:no cva tenderness NEURO: Pt is awake/alert/appropriate, moves all extremitiesx4.  No facial droop.   EXTREMITIES: pulses normal/equal, full ROM SKIN: warm, color normal PSYCH: no abnormalities of mood noted, alert and oriented to situation   Results for orders placed or performed in visit on 02/21/15  POCT urinalysis dipstick  Result Value Ref Range   Color, UA yellow yellow   Clarity, UA clear clear   Glucose, UA negative negative   Bilirubin, UA negative negative   Ketones, POC UA negative negative   Spec Grav, UA 1.020    Blood, UA trace-lysed (A) negative   pH, UA 5.0    Protein Ur, POC negative negative   Urobilinogen, UA 0.2    Nitrite, UA Negative Negative   Leukocytes, UA Negative Negative  POCT Microscopic Urinalysis ([facility])  Result  Value Ref Range   WBC,UR,HPF,POC None None WBC/hpf   RBC,UR,HPF,POC None None RBC/hpf   Bacteria None None, Too numerous to count   Mucus Absent Absent   Epithelial Cells, UR Per Microscopy None None, Too numerous to count cells/hpf        Assessment & Plan:  Patient doing well. He is not currently on metformin. He has had no recent gout flares. He went to the urologist and PSA dropped to 2 after treatment for urinary tract infection. Routine labs were done today. I did change his lisinopril HCTZ to plain lisinopril because of his gout history. I changed his lisinopril HCTZ to plain lisinopril because of his gout. Prevnar was given today. I  personally performed the services described in this documentation, which was scribed in my presence. The recorded information has been reviewed and is accurate. Matthew Russian, MD

## 2015-02-22 LAB — HEPATITIS C ANTIBODY: HCV AB: NEGATIVE

## 2015-02-22 LAB — TSH: TSH: 0.814 u[IU]/mL (ref 0.350–4.500)

## 2015-02-25 ENCOUNTER — Telehealth: Payer: Self-pay

## 2015-02-25 NOTE — Telephone Encounter (Signed)
Error

## 2015-02-28 ENCOUNTER — Encounter: Payer: Self-pay | Admitting: *Deleted

## 2015-03-02 ENCOUNTER — Telehealth: Payer: Self-pay

## 2015-03-02 NOTE — Telephone Encounter (Signed)
Pt is looking for his a1c results from his labs from 02/21/15

## 2015-03-02 NOTE — Telephone Encounter (Signed)
Pt is looking for his a1c results from 02/21/15 labs

## 2015-03-04 NOTE — Telephone Encounter (Signed)
Advised pt of results.

## 2015-03-07 ENCOUNTER — Other Ambulatory Visit: Payer: Self-pay | Admitting: *Deleted

## 2015-03-07 DIAGNOSIS — Z1211 Encounter for screening for malignant neoplasm of colon: Secondary | ICD-10-CM

## 2015-03-07 LAB — POC HEMOCCULT BLD/STL (HOME/3-CARD/SCREEN)
FECAL OCCULT BLD: NEGATIVE
FECAL OCCULT BLD: NEGATIVE
Fecal Occult Blood, POC: NEGATIVE

## 2015-03-13 DIAGNOSIS — M799 Soft tissue disorder, unspecified: Secondary | ICD-10-CM | POA: Diagnosis not present

## 2015-03-15 ENCOUNTER — Other Ambulatory Visit: Payer: Self-pay | Admitting: Emergency Medicine

## 2015-03-15 ENCOUNTER — Telehealth: Payer: Self-pay | Admitting: Emergency Medicine

## 2015-03-15 DIAGNOSIS — I10 Essential (primary) hypertension: Secondary | ICD-10-CM

## 2015-03-15 MED ORDER — AMLODIPINE BESYLATE 5 MG PO TABS: 5.0000 mg | ORAL_TABLET | Freq: Every day | ORAL | Status: DC

## 2015-03-15 NOTE — Telephone Encounter (Signed)
Phone call from patient. He has upset his blood pressure has gone up since stopping the diuretic. This was stopped because of his frequent flareups of gout. He has upset mainly for insurance reasons. We'll add Norvasc 5 mg to take along with his lisinopril 20. Recheck in 2 weeks.

## 2015-04-11 ENCOUNTER — Telehealth: Payer: Self-pay | Admitting: Family Medicine

## 2015-04-11 ENCOUNTER — Telehealth: Payer: Self-pay

## 2015-04-11 ENCOUNTER — Other Ambulatory Visit: Payer: Medicare Other

## 2015-04-11 NOTE — Telephone Encounter (Signed)
lmom for pt to come into the [facility] building by 4:30 to check BP only per Daub Crystal called him at 7:58 and i called and LM at 8:31

## 2015-04-24 ENCOUNTER — Telehealth: Payer: Self-pay

## 2015-04-24 NOTE — Telephone Encounter (Signed)
Waiting on payment of $71.00 for 162 pages. From [employer].

## 2015-05-03 NOTE — Telephone Encounter (Signed)
Payment received and records sent on 05/03/15

## 2015-05-14 DIAGNOSIS — Z0271 Encounter for disability determination: Secondary | ICD-10-CM

## 2015-06-02 ENCOUNTER — Telehealth: Payer: Self-pay | Admitting: Radiology

## 2015-06-02 NOTE — Telephone Encounter (Signed)
Called patient to advise Dr Everlene Farrier has mailed letter/ records he requested left mssg

## 2015-11-28 ENCOUNTER — Other Ambulatory Visit (INDEPENDENT_AMBULATORY_CARE_PROVIDER_SITE_OTHER): Payer: Self-pay | Admitting: Specialist

## 2015-11-28 MED ORDER — ALLOPURINOL 300 MG PO TABS: 300.0000 mg | ORAL_TABLET | Freq: Every day | ORAL | 3 refills | Status: DC

## 2015-11-28 NOTE — Progress Notes (Signed)
Rx for allopurinol renewed, 90 day with 3 refills. Pharmacy sent fax requesting refills for primary gout.

## 2015-12-27 ENCOUNTER — Other Ambulatory Visit: Payer: Self-pay | Admitting: Emergency Medicine

## 2016-01-03 ENCOUNTER — Other Ambulatory Visit: Payer: Self-pay | Admitting: Emergency Medicine

## 2016-01-03 ENCOUNTER — Ambulatory Visit: Payer: Medicare Other

## 2016-01-03 NOTE — Telephone Encounter (Signed)
Pt has a balance and will need to have it taken care of before OV. Pt is getting the issue resolved but still would like to have refill if he is not able to be seen today.

## 2016-01-03 NOTE — Telephone Encounter (Signed)
Patient has an appt. Today

## 2016-01-05 NOTE — Telephone Encounter (Signed)
Per dr Everlene Farrier, ok to fill 90 days.

## 2016-03-20 ENCOUNTER — Other Ambulatory Visit: Payer: Self-pay | Admitting: Emergency Medicine

## 2016-03-20 DIAGNOSIS — I10 Essential (primary) hypertension: Secondary | ICD-10-CM

## 2016-04-17 ENCOUNTER — Other Ambulatory Visit: Payer: Self-pay | Admitting: Emergency Medicine

## 2016-04-17 NOTE — Telephone Encounter (Signed)
Left message to schedule visit before this supply is exhausted.  Meds ordered this encounter  Medications  . lisinopril-hydrochlorothiazide (PRINZIDE,ZESTORETIC) 20-12.5 MG tablet    Sig: TAKE 1 TABLET EVERY DAY    Dispense:  90 tablet    Refill:  0    Please notify patient that s/he needs an office visit +/- labsfor additional refills.

## 2016-04-26 ENCOUNTER — Other Ambulatory Visit: Payer: Self-pay | Admitting: Physician Assistant

## 2016-04-26 DIAGNOSIS — I10 Essential (primary) hypertension: Secondary | ICD-10-CM

## 2016-04-27 NOTE — Telephone Encounter (Signed)
Spoke with patient. Advised of refill authorization and need for re-evaluation/wellness. He needs to establish with a new PCP since Dr. Everlene Farrier has retired. He only wants to see a physician, and only wants to see one who has been here for a while, not someone new. Prefers Dr. Janeann Forehand.  Transferred call to [place] to schedule.  Meds ordered this encounter  Medications  . amLODipine (NORVASC) 5 MG tablet    Sig: TAKE 1 TABLET BY MOUTH DAILY    Dispense:  90 tablet    Refill:  0

## 2016-05-04 ENCOUNTER — Ambulatory Visit (INDEPENDENT_AMBULATORY_CARE_PROVIDER_SITE_OTHER): Payer: Medicare HMO | Admitting: Urgent Care

## 2016-05-04 VITALS — BP 131/74 | HR 71 | Temp 97.6°F | Resp 18 | Ht 69.29 in | Wt 187.4 lb

## 2016-05-04 DIAGNOSIS — Z0000 Encounter for general adult medical examination without abnormal findings: Secondary | ICD-10-CM | POA: Diagnosis not present

## 2016-05-04 DIAGNOSIS — E119 Type 2 diabetes mellitus without complications: Secondary | ICD-10-CM | POA: Diagnosis not present

## 2016-05-04 NOTE — Patient Instructions (Signed)
     IF you received an x-ray today, you will receive an invoice from [facility]. Please contact [facility] at [phone] with questions or concerns regarding your invoice.   IF you received labwork today, you will receive an invoice from [facility]. Please contact [facility] at [phone] with questions or concerns regarding your invoice.   Our billing staff will not be able to assist you with questions regarding bills from these companies.  You will be contacted with the lab results as soon as they are available. The fastest way to get your results is to activate your My Chart account. Instructions are located on the last page of this paperwork. If you have not heard from us regarding the results in 2 weeks, please contact this office.     

## 2016-05-04 NOTE — Progress Notes (Deleted)
    MRN: [id number]  Subjective:   [name] is a 66 y.o. male presenting for annual physical exam and ***.  Medical care team includes: PCP: No PCP Per Patient Vision: *** Dental: *** Specialists:      Health Maintenance:  Matthew Simon has Hypertension; Colon polyp; and Type 2 diabetes mellitus ([place]) on his problem list.  Matthew Simon has a current medication list which includes the following prescription(s): allopurinol, amlodipine, aspirin, lisinopril, and lisinopril-hydrochlorothiazide. He has No Known Allergies.  Matthew Simon  has a past medical history of Gout and Hypertension. Also  has no past surgical history on file.  family history includes Cancer in his mother; Stroke in his father.  Immunizations:   ROS  Objective:   Vitals: BP 131/74   Pulse 71   Temp 97.6 F (36.4 C) (Oral)   Resp 18   Ht 5' 9.29" (1.76 m)   Wt 187 lb 6.4 oz (85 kg)   SpO2 96%   BMI 27.44 kg/m   Physical Exam  No results found for this or any previous visit (from the past 24 hour(s)).  Assessment and Plan :     Jaynee Eagles, PA-C [facility] [phone] 05/04/2016  8:23 AM

## 2016-05-04 NOTE — Progress Notes (Deleted)
Patient states that he requested to see Dr. Carlota Raspberry. He is upset about waiting. I offered to help but patient requests Dr. Carlota Raspberry and would like to reschedule.

## 2016-05-05 ENCOUNTER — Ambulatory Visit (INDEPENDENT_AMBULATORY_CARE_PROVIDER_SITE_OTHER): Payer: Medicare HMO | Admitting: Family Medicine

## 2016-05-05 VITALS — BP 136/81 | HR 65 | Temp 98.0°F | Resp 16 | Ht 70.0 in | Wt 186.8 lb

## 2016-05-05 DIAGNOSIS — R7989 Other specified abnormal findings of blood chemistry: Secondary | ICD-10-CM | POA: Diagnosis not present

## 2016-05-05 DIAGNOSIS — I10 Essential (primary) hypertension: Secondary | ICD-10-CM | POA: Diagnosis not present

## 2016-05-05 DIAGNOSIS — M109 Gout, unspecified: Secondary | ICD-10-CM | POA: Diagnosis not present

## 2016-05-05 DIAGNOSIS — Z1211 Encounter for screening for malignant neoplasm of colon: Secondary | ICD-10-CM | POA: Diagnosis not present

## 2016-05-05 DIAGNOSIS — Z87898 Personal history of other specified conditions: Secondary | ICD-10-CM | POA: Diagnosis not present

## 2016-05-05 DIAGNOSIS — R350 Frequency of micturition: Secondary | ICD-10-CM

## 2016-05-05 DIAGNOSIS — R200 Anesthesia of skin: Secondary | ICD-10-CM | POA: Diagnosis not present

## 2016-05-05 DIAGNOSIS — Z0000 Encounter for general adult medical examination without abnormal findings: Secondary | ICD-10-CM | POA: Diagnosis not present

## 2016-05-05 DIAGNOSIS — M25572 Pain in left ankle and joints of left foot: Secondary | ICD-10-CM | POA: Diagnosis not present

## 2016-05-05 DIAGNOSIS — Z23 Encounter for immunization: Secondary | ICD-10-CM

## 2016-05-05 DIAGNOSIS — M2012 Hallux valgus (acquired), left foot: Secondary | ICD-10-CM

## 2016-05-05 LAB — CMP14+EGFR
ALK PHOS: 40 IU/L (ref 39–117)
ALT: 20 IU/L (ref 0–44)
AST: 25 IU/L (ref 0–40)
Albumin/Globulin Ratio: 1.7 (ref 1.2–2.2)
Albumin: 4.7 g/dL (ref 3.6–4.8)
BILIRUBIN TOTAL: 0.4 mg/dL (ref 0.0–1.2)
BUN/Creatinine Ratio: 15 (ref 10–24)
BUN: 23 mg/dL (ref 8–27)
CHLORIDE: 100 mmol/L (ref 96–106)
CO2: 26 mmol/L (ref 18–29)
Calcium: 10.2 mg/dL (ref 8.6–10.2)
Creatinine, Ser: 1.55 mg/dL — ABNORMAL HIGH (ref 0.76–1.27)
GFR calc non Af Amer: 46 mL/min/{1.73_m2} — ABNORMAL LOW (ref >59)
GFR, EST AFRICAN AMERICAN: 53 mL/min/{1.73_m2} — AB (ref >59)
GLUCOSE: 105 mg/dL — AB (ref 65–99)
Globulin, Total: 2.7 g/dL (ref 1.5–4.5)
Potassium: 4.6 mmol/L (ref 3.5–5.2)
Sodium: 141 mmol/L (ref 134–144)
TOTAL PROTEIN: 7.4 g/dL (ref 6.0–8.5)

## 2016-05-05 LAB — LIPID PANEL
CHOLESTEROL TOTAL: 218 mg/dL — AB (ref 100–199)
Chol/HDL Ratio: 3.1 ratio (ref 0.0–5.0)
HDL: 71 mg/dL (ref >39)
LDL Calculated: 131 mg/dL — ABNORMAL HIGH (ref 0–99)
TRIGLYCERIDES: 81 mg/dL (ref 0–149)
VLDL CHOLESTEROL CAL: 16 mg/dL (ref 5–40)

## 2016-05-05 LAB — HEMOGLOBIN A1C
ESTIMATED AVERAGE GLUCOSE: 97 mg/dL
Hgb A1c MFr Bld: 5 % (ref 4.8–5.6)

## 2016-05-05 MED ORDER — LISINOPRIL 20 MG PO TABS: 20.0000 mg | ORAL_TABLET | Freq: Every day | ORAL | 1 refills | Status: DC

## 2016-05-05 MED ORDER — AMLODIPINE BESYLATE 5 MG PO TABS: 5.0000 mg | ORAL_TABLET | Freq: Every day | ORAL | 1 refills | Status: DC

## 2016-05-05 MED ORDER — ALLOPURINOL 100 MG PO TABS: 100.0000 mg | ORAL_TABLET | Freq: Every day | ORAL | 1 refills | Status: DC

## 2016-05-05 NOTE — Patient Instructions (Addendum)
Decrease allopurinol to 100mg  each day and return in 6 weeks to repeat kidney function testing. If still elevated at that time, may need to see nephrologist to look into other causes.   Continue amlodipine at 5mg  each day, lisinopril 20mg  each day.   Follow up with Dr. Tresa Moore about your prostate symptoms and to repeat exam, but I will check PSA on bloodwork from yesterday.  Follow up with general surgery when you are ready for removal of area on back of neck. You do have an umbilical hernia, but I do not appreciate another hernia on abdomen. You can have the surgeon check this area as well. If any swelling or worse pain on abdomen - return for recheck.   please return to examine and discuss bump in groin further.   I will refer you to podiatry for the foot pain and to evaluate the bunion further.   Keeping you healthy  Get these tests  Blood pressure- Have your blood pressure checked once a year by your healthcare provider.  Normal blood pressure is 120/80  Weight- Have your body mass index (BMI) calculated to screen for obesity.  BMI is a measure of body fat based on height and weight. You can also calculate your own BMI at [email or web address].  Cholesterol- Have your cholesterol checked every year.  Diabetes- Have your blood sugar checked regularly if you have high blood pressure, high cholesterol, have a family history of diabetes or if you are overweight.  Screening for Colon Cancer- Colonoscopy starting at age 33.  Screening may begin sooner depending on your family history and other health conditions. Follow up colonoscopy as directed by your Gastroenterologist.  Screening for Prostate Cancer- Both blood work (PSA) and a rectal exam help screen for Prostate Cancer.  Screening begins at age 50 with African-American men and at age 56 with Caucasian men.  Screening may begin sooner depending on your family history.  Take these medicines  Aspirin- One aspirin daily can help  prevent Heart disease and Stroke.  Flu shot- Every fall.  Tetanus- Every 10 years.  Zostavax- Once after the age of 66 to prevent Shingles.  Pneumonia shot- Once after the age of 77; if you are younger than 93, ask your healthcare provider if you need a Pneumonia shot.  Take these steps  Don't smoke- If you do smoke, talk to your doctor about quitting.  For tips on how to quit, go to www.smokefree.gov or call 1-800-QUIT-NOW.  Be physically active- Exercise 5 days a week for at least 30 minutes.  If you are not already physically active start slow and gradually work up to 30 minutes of moderate physical activity.  Examples of moderate activity include walking briskly, mowing the yard, dancing, swimming, bicycling, etc.  Eat a healthy diet- Eat a variety of healthy food such as fruits, vegetables, low fat milk, low fat cheese, yogurt, lean meant, poultry, fish, beans, tofu, etc. For more information go to www.thenutritionsource.org  Drink alcohol in moderation- Limit alcohol intake to less than two drinks a day. Never drink and drive.  Dentist- Brush and floss twice daily; visit your dentist twice a year.  Depression- Your emotional health is as important as your physical health. If you're feeling down, or losing interest in things you would normally enjoy please talk to your healthcare provider.  Eye exam- Visit your eye doctor every year.  Safe sex- If you may be exposed to a sexually transmitted infection, use a condom.  Seat belts- Seat belts  can save your life; always wear one.  Smoke/Carbon Monoxide detectors- These detectors need to be installed on the appropriate level of your home.  Replace batteries at least once a year.  Skin cancer- When out in the sun, cover up and use sunscreen 15 SPF or higher.  Violence- If anyone is threatening you, please tell your healthcare provider.  Living Will/ Health care power of attorney- Speak with your healthcare provider and  family.  IF you received an x-ray today, you will receive an invoice from [facility]. Please contact [facility] at [phone] with questions or concerns regarding your invoice.   IF you received labwork today, you will receive an invoice from [facility]. Please contact [facility] at [phone] with questions or concerns regarding your invoice.   Our billing staff will not be able to assist you with questions regarding bills from these companies.  You will be contacted with the lab results as soon as they are available. The fastest way to get your results is to activate your My Chart account. Instructions are located on the last page of this paperwork. If you have not heard from [place] regarding the results in 2 weeks, please contact this office.

## 2016-05-05 NOTE — Progress Notes (Addendum)
Subjective:  By signing my name below, I, Essence Howell, attest that this documentation has been prepared under the direction and in the presence of Wendie Agreste, MD Electronically Signed: Ladene Artist, ED Scribe 05/05/2016 at 8:56 AM.   Patient ID: Matthew Simon, male    DOB: November 21, 1950, 66 y.o.   MRN: [id number]  Chief Complaint  Patient presents with  . Annual Exam   HPI Matthew Simon is a 67 y.o. male who presents to Primary Care at [facility] for an annual exam. Last CPE was last year by Dr. Everlene Farrier. Pt has a h/o HTN, gout and pre-diabetes.   Gout Pt states that his gout is a result of Lisinopril-HCTZ. He states that he has not had any gout flare-up in 2 years since switching medications. Pt is currently taking 300 mg of allopurinol daily for prevention.   Elevated Creatinine Pt's labs from yesterday show that his creatinine has increased to 1.55 since it was last checked in January. He denies h/o kidney failure. Pt denies taking NSAIDs. He is consuming a few bottles of water daily.   HTN Pt is taking Norvasc 5 mg daily and Lisinopril 20 mg daily. He is checking his BP outside of the office, reporting a reading of 131/70 yesterday. Pt denies any side effects. He reports systolic's in the 366Q when he first switched to Lisinopril. He denies chest pain, chest tightness, sob.  Urinary Frequency Pt reports intermittent urinary frequency and urgency that has gradually worsened. He has been followed by urologist Dr. Tresa Moore in the past, last seen on 01/04/15 and was due to follow-up in 3 months. Pt had an elevated PSA that was treated with 10 day course of Cipro. Thought to be due to prostatitis. Pt had a normal digital rectal exam at that time. He does not recall a recent PSA recheck. He denies hematuria.  Lab Results  Component Value Date   PSA 20.47 (H) 12/24/2014   PSA 1.09 02/12/2012   Numbness  Pt reports numbness in the left second toe. He states that he stubbed his toe while playing  tennis. He has noticed that his toe is stiff in the morning but loosens up throughout the day. Pain is improved with wearing shoes that are tighter in the toes.   Hernia Pt has noticed a bulge in his right lower abdomen first noticed a few months ago. Pt denies fever, nausea, vomiting, constipation, diarrhea, blood in stools, change in size with heaving lifting. Per Dr. Perfecto Kingdom note, he noticed a small umbilical hernia last year. No h/o hernia repair.   Neck Pt has had the mass on his neck evaluated on 03/13/15 by Dr. Harlow Asa with [facility]. Possible lipoma or cyst. Pt states that he could not afford the "down time" that comes with surgery but plans to follow-up.   CA Screening Colonoscopy: Last colonoscopy was in his 100's. He states that Dr. Everlene Farrier recommended that he have one but a few family events have prevented him from doing so.  Prostate CA Screening: Dr. Perfecto Kingdom note states that his PSA had normalized after treatment for prostatitis.   Immunizations Immunization History  Administered Date(s) Administered  . Influenza, Seasonal, Injecte, Preservative Fre 02/12/2012  . Influenza,inj,Quad PF,36+ Mos 12/24/2014  . Pneumococcal Conjugate-13 02/21/2015  . Pneumococcal Polysaccharide-23 02/26/2010   Tetanus: Pt's tetanus is out of date.  Flu Vaccine: Pt did not receive a flu vaccine this season.  Hep C Screening: January 2017.   Depression Screening Depression screen [facility] 2/9  05/05/2016 05/04/2016 02/21/2015 12/24/2014 12/24/2014  Decreased Interest - 0 0 0 0  Down, Depressed, Hopeless 0 0 0 0 0  PHQ - 2 Score 0 0 0 0 0   Fall Risk  05/04/2016 02/21/2015  Falls in the past year? No No   Functional Status Survey: Is the patient deaf or have difficulty hearing?: No Does the patient have difficulty seeing, even when wearing glasses/contacts?: No Does the patient have difficulty concentrating, remembering, or making decisions?: No Does the patient have difficulty walking or climbing  stairs?: No Does the patient have difficulty dressing or bathing?: No Does the patient have difficulty doing errands alone such as visiting a doctor's office or shopping?: No  Vision  Visual Acuity Screening   Right eye Left eye Both eyes  Without correction:     With correction: 20/15 20/15 20/15    Patient Active Problem List   Diagnosis Date Noted  . Gout 05/05/2016  . Hypertension 04/10/2011  . Colon polyp 04/10/2011   Past Medical History:  Diagnosis Date  . Gout   . Hypertension    No past surgical history on file. No Known Allergies Prior to Admission medications   Medication Sig Start Date End Date Taking? Authorizing Provider  allopurinol (ZYLOPRIM) 300 MG tablet Take 1 tablet (300 mg total) by mouth daily. 11/28/15   Jessy Oto, MD  amLODipine (NORVASC) 5 MG tablet TAKE 1 TABLET BY MOUTH DAILY 04/27/16   Chelle Jeffery, PA-C  aspirin 81 MG tablet Take 81 mg by mouth 2 (two) times a week. Reported on 02/21/2015    Historical Provider, MD  lisinopril (PRINIVIL,ZESTRIL) 20 MG tablet Take 1 tablet (20 mg total) by mouth daily. 02/21/15   [name], MD  lisinopril-hydrochlorothiazide ([place]) 20-12.5 MG tablet TAKE 1 TABLET EVERY DAY Patient not taking: Reported on 05/04/2016 04/17/16   Harrison Mons, PA-C   Social History   Social History  . Marital status: Married    Spouse name: N/A  . Number of children: N/A  . Years of education: N/A   Occupational History  . business development    Social History Main Topics  . Smoking status: Former Smoker    Quit date: 05/10/1964  . Smokeless tobacco: Never Used  . Alcohol use 12.6 oz/week    21 Standard drinks or equivalent per week     Comment: wine with dinner  . Drug use: No  . Sexual activity: Not on file   Other Topics Concern  . Not on file   Social History Narrative  . No narrative on file   Review of Systems  Constitutional: Negative for fever.  Respiratory: Negative for chest tightness  and shortness of breath.   Cardiovascular: Negative for chest pain.  Gastrointestinal: Negative for blood in stool, constipation, diarrhea, nausea and vomiting.  Genitourinary: Positive for frequency. Negative for hematuria.  Neurological: Positive for numbness.  All other systems reviewed and are negative.     Objective:   Physical Exam  Constitutional: He is oriented to person, place, and time. He appears well-developed and well-nourished.  HENT:  Head: Normocephalic and atraumatic.  Right Ear: External ear normal.  Left Ear: External ear normal.  Mouth/Throat: Oropharynx is clear and moist.  Eyes: Conjunctivae and EOM are normal. Pupils are equal, round, and reactive to light.  Neck: Normal range of motion. Neck supple. No thyromegaly present.  Soft tissue prominence posterior cervical spine.   Cardiovascular: Normal rate, regular rhythm, normal heart sounds and intact distal pulses.  Pulmonary/Chest: Effort normal and breath sounds normal. No respiratory distress. He has no wheezes.  Abdominal: Soft. He exhibits no distension. There is no tenderness. A hernia is present. Hernia confirmed negative in the right inguinal area and confirmed negative in the left inguinal area.    Easily reducible umbilical hernia. Suspected lipoma R abdominal wall, nontender. No appreciable hernia on the lower abdominal wall.  Genitourinary:  Genitourinary Comments: Small cystic structure ~1.5 x 2 cm on the L genital area.  Musculoskeletal: Normal range of motion. He exhibits no edema or tenderness.  Slight contracture of second toe of L with prominent PIP. Discomfort between the first and second metatarsal heads. Negative lateral squeeze. Hallus valgus deformity.   Lymphadenopathy:    He has no cervical adenopathy.  Neurological: He is alert and oriented to person, place, and time. He has normal reflexes.  Skin: Skin is warm and dry.  Psychiatric: He has a normal mood and affect. His behavior is  normal.  Vitals reviewed.  Vitals:   05/05/16 0826  BP: 136/81  Pulse: 65  Resp: 16  Temp: 98 F (36.7 C)  TempSrc: Oral  SpO2: 96%  Weight: 186 lb 12.8 oz (84.7 kg)  Height: 5\' 10"  (1.778 m)   EKG:  Yes rhythm, rate 70, single ectopic beat, no concerning findings otherwise.    Assessment & Plan:   Matthew Simon is a 66 y.o. male Annual physical exam - Plan: EKG 12-Lead  - - anticipatory guidance as below in AVS, screening labs if needed. Health maintenance items as above in HPI discussed/recommended as applicable.   - no concerning responses on depression, fall, or functional status screening. Any positive responses noted as above. Advanced directives discussed as in CHL.   Gout, unspecified cause, unspecified chronicity, unspecified site - Plan: allopurinol (ZYLOPRIM) 100 MG tablet, Uric Acid  - Trial of lower dose allopurinol as asymptomatic and prior elevated creatinine. Check uric acid, then consider repeat uric acid testing in 6 weeks.   Elevated serum creatinine  - Decrease allopurinol as above, avoid NSAIDs,  with repeat testing in approximately 6 weeks.  Essential hypertension - Plan: lisinopril (PRINIVIL,ZESTRIL) 20 MG tablet, amLODipine (NORVASC) 5 MG tablet, EKG 12-Lead  - Stable, continue same doses of lisinopril and amlodipine.  Urinary frequency History of elevated PSA - Plan: PSA  - Add PSA to previous blood work. Advised to follow-up with urology to discuss next steps/eval  Screen for colon cancer - Plan: Ambulatory referral to Gastroenterology  Pain of joint of left ankle and foot Numbness of toes Hallux valgus (acquired), left foot - Plan: Ambulatory referral to Podiatry  - Hallux valgus deformity, potential Morton's neuroma but negative lateral squeeze. Refer to podiatry to determine next step or possible orthotics.  Need for prophylactic vaccination against Streptococcus pneumoniae (pneumococcus) - Plan: Pneumococcal polysaccharide vaccine 23-valent  greater than or equal to 2yo subcutaneous/IM given.    Meds ordered this encounter  Medications  . allopurinol (ZYLOPRIM) 100 MG tablet    Sig: Take 1 tablet (100 mg total) by mouth daily.    Dispense:  90 tablet    Refill:  1  . lisinopril (PRINIVIL,ZESTRIL) 20 MG tablet    Sig: Take 1 tablet (20 mg total) by mouth daily.    Dispense:  90 tablet    Refill:  1  . amLODipine (NORVASC) 5 MG tablet    Sig: Take 1 tablet (5 mg total) by mouth daily.    Dispense:  90 tablet  Refill:  1   Patient Instructions   Decrease allopurinol to 100mg  each day and return in 6 weeks to repeat kidney function testing. If still elevated at that time, may need to see nephrologist to look into other causes.   Continue amlodipine at 5mg  each day, lisinopril 20mg  each day.   Follow up with Dr. Tresa Moore about your prostate symptoms and to repeat exam, but I will check PSA on bloodwork from yesterday.  Follow up with general surgery when you are ready for removal of area on back of neck. You do have an umbilical hernia, but I do not appreciate another hernia on abdomen. You can have the surgeon check this area as well. If any swelling or worse pain on abdomen - return for recheck.   please return to examine and discuss bump in groin further.   I will refer you to podiatry for the foot pain and to evaluate the bunion further.   Keeping you healthy  Get these tests  Blood pressure- Have your blood pressure checked once a year by your healthcare provider.  Normal blood pressure is 120/80  Weight- Have your body mass index (BMI) calculated to screen for obesity.  BMI is a measure of body fat based on height and weight. You can also calculate your own BMI at ViewBanking.si.  Cholesterol- Have your cholesterol checked every year.  Diabetes- Have your blood sugar checked regularly if you have high blood pressure, high cholesterol, have a family history of diabetes or if you are  overweight.  Screening for Colon Cancer- Colonoscopy starting at age 60.  Screening may begin sooner depending on your family history and other health conditions. Follow up colonoscopy as directed by your Gastroenterologist.  Screening for Prostate Cancer- Both blood work (PSA) and a rectal exam help screen for Prostate Cancer.  Screening begins at age 52 with African-American men and at age 37 with Caucasian men.  Screening may begin sooner depending on your family history.  Take these medicines  Aspirin- One aspirin daily can help prevent Heart disease and Stroke.  Flu shot- Every fall.  Tetanus- Every 10 years.  Zostavax- Once after the age of 14 to prevent Shingles.  Pneumonia shot- Once after the age of 31; if you are younger than 51, ask your healthcare provider if you need a Pneumonia shot.  Take these steps  Don't smoke- If you do smoke, talk to your doctor about quitting.  For tips on how to quit, go to www.smokefree.gov or call 1-800-QUIT-NOW.  Be physically active- Exercise 5 days a week for at least 30 minutes.  If you are not already physically active start slow and gradually work up to 30 minutes of moderate physical activity.  Examples of moderate activity include walking briskly, mowing the yard, dancing, swimming, bicycling, etc.  Eat a healthy diet- Eat a variety of healthy food such as fruits, vegetables, low fat milk, low fat cheese, yogurt, lean meant, poultry, fish, beans, tofu, etc. For more information go to www.thenutritionsource.org  Drink alcohol in moderation- Limit alcohol intake to less than two drinks a day. Never drink and drive.  Dentist- Brush and floss twice daily; visit your dentist twice a year.  Depression- Your emotional health is as important as your physical health. If you're feeling down, or losing interest in things you would normally enjoy please talk to your healthcare provider.  Eye exam- Visit your eye doctor every year.  Safe sex- If  you may be exposed to a sexually  transmitted infection, use a condom.  Seat belts- Seat belts can save your life; always wear one.  Smoke/Carbon Monoxide detectors- These detectors need to be installed on the appropriate level of your home.  Replace batteries at least once a year.  Skin cancer- When out in the sun, cover up and use sunscreen 15 SPF or higher.  Violence- If anyone is threatening you, please tell your healthcare provider.  Living Will/ Health care power of attorney- Speak with your healthcare provider and family.  IF you received an x-ray today, you will receive an invoice from [facility]. Please contact [facility] at [phone] with questions or concerns regarding your invoice.   IF you received labwork today, you will receive an invoice from [facility]. Please contact [facility] at [phone] with questions or concerns regarding your invoice.   Our billing staff will not be able to assist you with questions regarding bills from these companies.  You will be contacted with the lab results as soon as they are available. The fastest way to get your results is to activate your My Chart account. Instructions are located on the last page of this paperwork. If you have not heard from [place] regarding the results in 2 weeks, please contact this office.       I personally performed the services described in this documentation, which was scribed in my presence. The recorded information has been reviewed and considered for accuracy and completeness, addended by me as needed, and agree with information above.  Signed,   Merri Ray, MD [facility] at [place].  05/05/16 1:32 PM

## 2016-05-06 ENCOUNTER — Encounter: Payer: Self-pay | Admitting: Urgent Care

## 2016-05-06 LAB — PSA: PROSTATE SPECIFIC AG, SERUM: 1.5 ng/mL (ref 0.0–4.0)

## 2016-05-06 LAB — URIC ACID: URIC ACID: 4.3 mg/dL (ref 3.7–8.6)

## 2016-05-13 NOTE — Progress Notes (Signed)
Error

## 2016-05-22 ENCOUNTER — Telehealth: Payer: Self-pay | Admitting: Family Medicine

## 2016-05-22 NOTE — Telephone Encounter (Signed)
Just seen 05/05/16 He wants to speak to md only.Marland Kitchenappointment?

## 2016-05-22 NOTE — Telephone Encounter (Signed)
I left him a message on voicemail. He can send mychart message to me if able, return for exam if new problem, or if this was area discussed at last visit, can try to call him back tomorrow to discuss further.

## 2016-05-22 NOTE — Telephone Encounter (Signed)
PATIENT WOULD LIKE DR. GREENE TO CALL HIM REGARDING A GROWTH IN HIS GROIN. HE SAID DR. GREENE IS VERY FAMILIAR WITH HIM. BEST PHONE [phone] (CELL) [place]

## 2016-05-23 ENCOUNTER — Encounter: Payer: Self-pay | Admitting: Family Medicine

## 2016-05-23 ENCOUNTER — Ambulatory Visit (INDEPENDENT_AMBULATORY_CARE_PROVIDER_SITE_OTHER): Payer: Medicare HMO | Admitting: Family Medicine

## 2016-05-23 VITALS — BP 148/82 | HR 63 | Temp 98.2°F | Resp 16 | Ht 69.0 in | Wt 189.2 lb

## 2016-05-23 DIAGNOSIS — L02214 Cutaneous abscess of groin: Secondary | ICD-10-CM | POA: Diagnosis not present

## 2016-05-23 DIAGNOSIS — R1032 Left lower quadrant pain: Secondary | ICD-10-CM

## 2016-05-23 MED ORDER — DOXYCYCLINE HYCLATE 100 MG PO TABS: 100.0000 mg | ORAL_TABLET | Freq: Two times a day (BID) | ORAL | 0 refills | Status: DC

## 2016-05-23 NOTE — Progress Notes (Signed)
Verbal Consent Obtained. Local anesthesia with 2 cc of 2% lidocaine plain.  11 blade used to incise the lesion centrally.  Small amount of purulence and large amount of gray colored sebaceous material expressed. Packed with 1/4 inch plain packing.  Cleansed and dressed.

## 2016-05-23 NOTE — Patient Instructions (Addendum)
Start doxycycline, other wound care as below.  I will check a wound culture to make sure we are treating the right infection.   Apply a warm compress to the area for 15-20 minutes 2-4 times each day. Change the dressing if it becomes saturated, leaks or falls off.     IF you received an x-ray today, you will receive an invoice from [facility]. Please contact [facility] at [phone] with questions or concerns regarding your invoice.   IF you received labwork today, you will receive an invoice from [place]. Please contact [facility] at [phone] with questions or concerns regarding your invoice.   Our billing staff will not be able to assist you with questions regarding bills from these companies.  You will be contacted with the lab results as soon as they are available. The fastest way to get your results is to activate your My Chart account. Instructions are located on the last page of this paperwork. If you have not heard from [place] regarding the results in 2 weeks, please contact this office.      Skin Abscess A skin abscess is an infected area on or under your skin that contains a collection of pus and other material. An abscess may also be called a furuncle, carbuncle, or boil. An abscess can occur in or on almost any part of your body. Some abscesses break open (rupture) on their own. Most continue to get worse unless they are treated. The infection can spread deeper into the body and eventually into your blood, which can make you feel ill. Treatment usually involves draining the abscess. What are the causes? An abscess occurs when germs, often bacteria, pass through your skin and cause an infection. This may be caused by:  A scrape or cut on your skin.  A puncture wound through your skin, including a needle injection.  Blocked oil or sweat glands.  Blocked and infected hair follicles.  A cyst that forms beneath your skin (sebaceous cyst) and becomes  infected. What increases the risk? This condition is more likely to develop in people who:  Have a weak body defense system (immune system).  Have diabetes.  Have dry and irritated skin.  Get frequent injections or use illegal IV drugs.  Have a foreign body in a wound, such as a splinter.  Have problems with their lymph system or veins. What are the signs or symptoms? An abscess may start as a painful, firm bump under the skin. Over time, the abscess may get larger or become softer. Pus may appear at the top of the abscess, causing pressure and pain. It may eventually break through the skin and drain. Other symptoms include:  Redness.  Warmth.  Swelling.  Tenderness.  A sore on the skin. How is this diagnosed? This condition is diagnosed based on your medical history and a physical exam. A sample of pus may be taken from the abscess to find out what is causing the infection and what antibiotics can be used to treat it. You also may have:  Blood tests to look for signs of infection or spread of an infection to your blood.  Imaging studies such as ultrasound, CT scan, or MRI if the abscess is deep. How is this treated? Small abscesses that drain on their own may not need treatment. Treatment for an abscess that does not rupture on its own may include:  Warm compresses applied to the area several times per day.  Incision and drainage. Your health care  provider will make an incision to open the abscess and will remove pus and any foreign body or dead tissue. The incision area may be packed with gauze to keep it open for a few days while it heals.  Antibiotic medicines to treat infection. For a severe abscess, you may first get antibiotics through an IV and then change to oral antibiotics. Follow these instructions at home: Abscess Care   If you have an abscess that has not drained, place a warm, clean, wet washcloth over the abscess several times a day. Do this as told by your  health care provider.  Follow instructions from your health care provider about how to take care of your abscess. Make sure you:  Cover the abscess with a bandage (dressing).  Change your dressing or gauze as told by your health care provider.  Wash your hands with soap and water before you change the dressing or gauze. If soap and water are not available, use hand sanitizer.  Check your abscess every day for signs of a worsening infection. Check for:  More redness, swelling, or pain.  More fluid or blood.  Warmth.  More pus or a bad smell. Medicines   Take over-the-counter and prescription medicines only as told by your health care provider.  If you were prescribed an antibiotic medicine, take it as told by your health care provider. Do not stop taking the antibiotic even if you start to feel better. General instructions   To avoid spreading the infection:  Do not share personal care items, towels, or hot tubs with others.  Avoid making skin contact with other people.  Keep all follow-up visits as told by your health care provider. This is important. Contact a health care provider if:  You have more redness, swelling, or pain around your abscess.  You have more fluid or blood coming from your abscess.  Your abscess feels warm to the touch.  You have more pus or a bad smell coming from your abscess.  You have a fever.  You have muscle aches.  You have chills or a general ill feeling. Get help right away if:  You have severe pain.  You see red streaks on your skin spreading away from the abscess. This information is not intended to replace advice given to you by your health care provider. Make sure you discuss any questions you have with your health care provider. Document Released: 10/29/2004 Document Revised: 09/15/2015 Document Reviewed: 11/28/2014 Elsevier Interactive Patient Education  2017 [employer].

## 2016-05-23 NOTE — Progress Notes (Signed)
Subjective:  By signing my name below, I, [name], attest that this documentation has been prepared under the direction and in the presence of Merri Ray, MD. Electronically Signed: [name], [place]. 05/23/2016 , 4:43 PM .  Patient was seen in Room 14 .   Patient ID: Matthew Simon, male    DOB: [birth year], 66 y.o.   MRN: [id number] Chief Complaint  Patient presents with  . Lipoma    Groin Area   HPI Matthew Simon is a 65 y.o. male Here for growth in his groin. He was last seen for a physical on April 3rd. He noticed a bulge in left lower abdomen a few months prior. There was an easily reducible umbilical hernia on exam, and suspected lipoma over right abdominal wall that was non tender. He also had a small cystic structure in left genital area, measuring approximately 2cm across. Due to other concerns at the physical, he was asked to return to discuss the area in groin further.   Patient states the cystic structure in his groin has become bigger and hurting more in the past few days. He reports the cystic structure has been present for about a year, but never had any problems or grew in size. He denies any known discharge from the structure. He denies applying a hot compress over the area. He denies taking medications for this. He denies constipation, measured fever, or chills.   Patient Active Problem List   Diagnosis Date Noted  . Gout 05/05/2016  . Hypertension 04/10/2011  . Colon polyp 04/10/2011   Past Medical History:  Diagnosis Date  . Gout   . Hypertension    No past surgical history on file. No Known Allergies Prior to Admission medications   Medication Sig Start Date End Date Taking? Authorizing Provider  allopurinol (ZYLOPRIM) 100 MG tablet Take 1 tablet (100 mg total) by mouth daily. 05/05/16  Yes Wendie Agreste, MD  amLODipine (NORVASC) 5 MG tablet Take 1 tablet (5 mg total) by mouth daily. 05/05/16  Yes Wendie Agreste, MD  aspirin 81 MG tablet Take 81  mg by mouth 2 (two) times a week. Reported on 02/21/2015   Yes Historical Provider, MD  lisinopril (PRINIVIL,ZESTRIL) 20 MG tablet Take 1 tablet (20 mg total) by mouth daily. 05/05/16  Yes Wendie Agreste, MD   Social History   Social History  . Marital status: Married    Spouse name: N/A  . Number of children: N/A  . Years of education: N/A   Occupational History  . business development    Social History Main Topics  . Smoking status: Former Smoker    Quit date: 05/10/1964  . Smokeless tobacco: Never Used  . Alcohol use 12.6 oz/week    21 Standard drinks or equivalent per week     Comment: wine with dinner  . Drug use: No  . Sexual activity: Not on file   Other Topics Concern  . Not on file   Social History Narrative  . No narrative on file   Review of Systems  Constitutional: Negative for fatigue and unexpected weight change.  Eyes: Negative for visual disturbance.  Respiratory: Negative for cough, chest tightness and shortness of breath.   Cardiovascular: Negative for chest pain, palpitations and leg swelling.  Gastrointestinal: Negative for abdominal pain and blood in stool.  Genitourinary:       Cystic structure in inguinal area  Neurological: Negative for dizziness, light-headedness and headaches.  Objective:   Physical Exam  Constitutional: He is oriented to person, place, and time. He appears well-developed and well-nourished. No distress.  HENT:  Head: Normocephalic and atraumatic.  Eyes: EOM are normal. Pupils are equal, round, and reactive to light.  Neck: Neck supple.  Cardiovascular: Normal rate.   Pulmonary/Chest: Effort normal. No respiratory distress.  Genitourinary:  Genitourinary Comments: Hard cystic-appearing structure, measuring approximately 3-4cm across, over the L inguinal area; there is some erythema and warmth in the area with some crusting centrally  Musculoskeletal: Normal range of motion.  Neurological: He is alert and oriented to  person, place, and time.  Skin: Skin is warm and dry.  Psychiatric: He has a normal mood and affect. His behavior is normal.  Nursing note and vitals reviewed.   Vitals:   05/23/16 1540  BP: (!) 148/82  Pulse: 63  Resp: 16  Temp: 98.2 F (36.8 C)  TempSrc: Oral  SpO2: 98%  Weight: 189 lb 4 oz (85.8 kg)  Height: 5\' 9"  (1.753 m)      Assessment & Plan:    Matthew Simon is a 66 y.o. male Abscess of left groin - Plan: WOUND CULTURE, doxycycline (VIBRA-TABS) 100 MG tablet  Pain in the groin, left  Infected sebaceous cyst/abscess drained as above per procedure note. Wound culture obtained, start doxycycline 100 mg twice a day, recheck in 2 days. Handout given, over-the-counter pain relievers discussed if needed.  Meds ordered this encounter  Medications  . doxycycline (VIBRA-TABS) 100 MG tablet    Sig: Take 1 tablet (100 mg total) by mouth 2 (two) times daily.    Dispense:  20 tablet    Refill:  0   Patient Instructions   Start doxycycline, other wound care as below.  I will check a wound culture to make sure we are treating the right infection.   Apply a warm compress to the area for 15-20 minutes 2-4 times each day. Change the dressing if it becomes saturated, leaks or falls off.     IF you received an x-ray today, you will receive an invoice from [facility]. Please contact [facility] at [phone] with questions or concerns regarding your invoice.   IF you received labwork today, you will receive an invoice from [place]. Please contact [facility] at [phone] with questions or concerns regarding your invoice.   Our billing staff will not be able to assist you with questions regarding bills from these companies.  You will be contacted with the lab results as soon as they are available. The fastest way to get your results is to activate your My Chart account. Instructions are located on the last page of this paperwork. If you have not heard from [place]  regarding the results in 2 weeks, please contact this office.      Skin Abscess A skin abscess is an infected area on or under your skin that contains a collection of pus and other material. An abscess may also be called a furuncle, carbuncle, or boil. An abscess can occur in or on almost any part of your body. Some abscesses break open (rupture) on their own. Most continue to get worse unless they are treated. The infection can spread deeper into the body and eventually into your blood, which can make you feel ill. Treatment usually involves draining the abscess. What are the causes? An abscess occurs when germs, often bacteria, pass through your skin and cause an infection. This may be caused by:  A scrape or cut  on your skin.  A puncture wound through your skin, including a needle injection.  Blocked oil or sweat glands.  Blocked and infected hair follicles.  A cyst that forms beneath your skin (sebaceous cyst) and becomes infected. What increases the risk? This condition is more likely to develop in people who:  Have a weak body defense system (immune system).  Have diabetes.  Have dry and irritated skin.  Get frequent injections or use illegal IV drugs.  Have a foreign body in a wound, such as a splinter.  Have problems with their lymph system or veins. What are the signs or symptoms? An abscess may start as a painful, firm bump under the skin. Over time, the abscess may get larger or become softer. Pus may appear at the top of the abscess, causing pressure and pain. It may eventually break through the skin and drain. Other symptoms include:  Redness.  Warmth.  Swelling.  Tenderness.  A sore on the skin. How is this diagnosed? This condition is diagnosed based on your medical history and a physical exam. A sample of pus may be taken from the abscess to find out what is causing the infection and what antibiotics can be used to treat it. You also may have:  Blood  tests to look for signs of infection or spread of an infection to your blood.  Imaging studies such as ultrasound, CT scan, or MRI if the abscess is deep. How is this treated? Small abscesses that drain on their own may not need treatment. Treatment for an abscess that does not rupture on its own may include:  Warm compresses applied to the area several times per day.  Incision and drainage. Your health care provider will make an incision to open the abscess and will remove pus and any foreign body or dead tissue. The incision area may be packed with gauze to keep it open for a few days while it heals.  Antibiotic medicines to treat infection. For a severe abscess, you may first get antibiotics through an IV and then change to oral antibiotics. Follow these instructions at home: Abscess Care   If you have an abscess that has not drained, place a warm, clean, wet washcloth over the abscess several times a day. Do this as told by your health care provider.  Follow instructions from your health care provider about how to take care of your abscess. Make sure you:  Cover the abscess with a bandage (dressing).  Change your dressing or gauze as told by your health care provider.  Wash your hands with soap and water before you change the dressing or gauze. If soap and water are not available, use hand sanitizer.  Check your abscess every day for signs of a worsening infection. Check for:  More redness, swelling, or pain.  More fluid or blood.  Warmth.  More pus or a bad smell. Medicines   Take over-the-counter and prescription medicines only as told by your health care provider.  If you were prescribed an antibiotic medicine, take it as told by your health care provider. Do not stop taking the antibiotic even if you start to feel better. General instructions   To avoid spreading the infection:  Do not share personal care items, towels, or hot tubs with others.  Avoid making skin  contact with other people.  Keep all follow-up visits as told by your health care provider. This is important. Contact a health care provider if:  You have more redness,  swelling, or pain around your abscess.  You have more fluid or blood coming from your abscess.  Your abscess feels warm to the touch.  You have more pus or a bad smell coming from your abscess.  You have a fever.  You have muscle aches.  You have chills or a general ill feeling. Get help right away if:  You have severe pain.  You see red streaks on your skin spreading away from the abscess. This information is not intended to replace advice given to you by your health care provider. Make sure you discuss any questions you have with your health care provider. Document Released: 10/29/2004 Document Revised: 09/15/2015 Document Reviewed: 11/28/2014 Elsevier Interactive Patient Education  2017 [employer].  I personally performed the services described in this documentation, which was scribed in my presence. The recorded information has been reviewed and considered for accuracy and completeness, addended by me as needed, and agree with information above.  Signed,   Merri [facility], MD [facility].  05/23/16 5:41 PM

## 2016-05-25 ENCOUNTER — Ambulatory Visit (INDEPENDENT_AMBULATORY_CARE_PROVIDER_SITE_OTHER): Payer: Medicare HMO | Admitting: Physician Assistant

## 2016-05-25 VITALS — BP 143/76 | HR 75 | Temp 98.4°F | Resp 17 | Ht 69.0 in | Wt 187.0 lb

## 2016-05-25 DIAGNOSIS — L02214 Cutaneous abscess of groin: Secondary | ICD-10-CM

## 2016-05-25 MED ORDER — SULFAMETHOXAZOLE-TRIMETHOPRIM 800-160 MG PO TABS: 1.0000 | ORAL_TABLET | Freq: Two times a day (BID) | ORAL | 0 refills | Status: AC

## 2016-05-25 NOTE — Patient Instructions (Addendum)
The wound will close fairly quickly. Keep it covered to collect any drainage until it does. Wash daily with soap and water. Continue applying the warm compresses as you are able.  I have sent an alternative antibiotic to the pharmacy for you.      IF you received an x-ray today, you will receive an invoice from [facility]. Please contact [facility] at [phone] with questions or concerns regarding your invoice.   IF you received labwork today, you will receive an invoice from [place]. Please contact [facility] at [phone] with questions or concerns regarding your invoice.   Our billing staff will not be able to assist you with questions regarding bills from these companies.  You will be contacted with the lab results as soon as they are available. The fastest way to get your results is to activate your My Chart account. Instructions are located on the last page of this paperwork. If you have not heard from [place] regarding the results in 2 weeks, please contact this office.

## 2016-05-25 NOTE — Progress Notes (Signed)
     Chief Complaint  Patient presents with  . Wound Check    History of Present Illness: Patient presents for wound care following I&D of an infected sebaceous cyst of the LEFT groin on 05/23/2016.  Overall, he is improved, though the area is quite tender. He only purchased 5 of the doxycycline tablets, due to cost, and has tolerated them well. No fever, chills. He is relieved to know that this lump did not represent a malignancy, but remains anxious about it in general.   No Known Allergies  Prior to Admission medications   Medication Sig Start Date End Date Taking? Authorizing Provider  allopurinol (ZYLOPRIM) 100 MG tablet Take 1 tablet (100 mg total) by mouth daily. 05/05/16  Yes Wendie Agreste, MD  amLODipine (NORVASC) 5 MG tablet Take 1 tablet (5 mg total) by mouth daily. 05/05/16  Yes Wendie Agreste, MD  aspirin 81 MG tablet Take 81 mg by mouth 2 (two) times a week. Reported on 02/21/2015   Yes Historical Provider, MD  doxycycline (VIBRA-TABS) 100 MG tablet Take 1 tablet (100 mg total) by mouth 2 (two) times daily. 05/23/16  Yes Wendie Agreste, MD  lisinopril (PRINIVIL,ZESTRIL) 20 MG tablet Take 1 tablet (20 mg total) by mouth daily. 05/05/16  Yes Wendie Agreste, MD    Patient Active Problem List   Diagnosis Date Noted  . Gout 05/05/2016  . Hypertension 04/10/2011  . Colon polyp 04/10/2011     Physical Exam  Constitutional: He is oriented to person, place, and time. He appears well-developed and well-nourished. He is active and cooperative. No distress.  BP (!) 143/76 (BP Location: Right Arm, Patient Position: Sitting, Cuff Size: Normal)   Pulse 75   Temp 98.4 F (36.9 C) (Oral)   Resp 17   Ht 5\' 9"  (1.753 m)   Wt 187 lb (84.8 kg)   SpO2 95%   BMI 27.62 kg/m    Eyes: Conjunctivae are normal.  Pulmonary/Chest: Effort normal.  Neurological: He is alert and oriented to person, place, and time.  Skin:     Psychiatric: His speech is normal and behavior is  normal. Judgment normal. His mood appears anxious. His affect is not angry, not blunt, not labile and not inappropriate. Thought content is not paranoid. Cognition and memory are normal. He does not exhibit a depressed mood.   Preliminary culture results show few gram positive cocci and mixed skin flora.   ASSESSMENT & PLAN:  Problem List Items Addressed This Visit    None    Visit Diagnoses    Abscess of left groin    -  Primary   Switch from doxycycline to SMX-TMP due to cost. Local wound care. Anticipatory guidance provided.   Relevant Medications   sulfamethoxazole-trimethoprim (BACTRIM DS,SEPTRA DS) 800-160 MG tablet       Return if symptoms worsen or fail to improve.   Fara Chute, PA-C [facility]

## 2016-05-26 LAB — WOUND CULTURE

## 2016-05-27 ENCOUNTER — Ambulatory Visit: Payer: Medicare HMO | Admitting: Podiatry

## 2016-06-09 ENCOUNTER — Ambulatory Visit: Payer: Medicare HMO

## 2016-06-09 ENCOUNTER — Encounter (INDEPENDENT_AMBULATORY_CARE_PROVIDER_SITE_OTHER): Payer: Medicare HMO | Admitting: Podiatry

## 2016-06-09 DIAGNOSIS — M2040 Other hammer toe(s) (acquired), unspecified foot: Secondary | ICD-10-CM

## 2016-06-09 NOTE — Progress Notes (Signed)
This encounter was created in error - please disregard.

## 2016-06-11 ENCOUNTER — Encounter: Payer: Self-pay | Admitting: Family Medicine

## 2016-06-18 ENCOUNTER — Other Ambulatory Visit (INDEPENDENT_AMBULATORY_CARE_PROVIDER_SITE_OTHER): Payer: Self-pay | Admitting: Specialist

## 2016-11-02 ENCOUNTER — Other Ambulatory Visit: Payer: Self-pay | Admitting: Family Medicine

## 2016-11-02 DIAGNOSIS — I10 Essential (primary) hypertension: Secondary | ICD-10-CM

## 2016-11-02 DIAGNOSIS — M109 Gout, unspecified: Secondary | ICD-10-CM

## 2016-12-04 ENCOUNTER — Other Ambulatory Visit: Payer: Self-pay | Admitting: Family Medicine

## 2016-12-04 DIAGNOSIS — M109 Gout, unspecified: Secondary | ICD-10-CM

## 2016-12-04 DIAGNOSIS — I10 Essential (primary) hypertension: Secondary | ICD-10-CM

## 2016-12-25 ENCOUNTER — Other Ambulatory Visit: Payer: Self-pay | Admitting: Family Medicine

## 2016-12-25 DIAGNOSIS — M109 Gout, unspecified: Secondary | ICD-10-CM

## 2016-12-25 DIAGNOSIS — I10 Essential (primary) hypertension: Secondary | ICD-10-CM

## 2017-01-18 ENCOUNTER — Other Ambulatory Visit: Payer: Self-pay | Admitting: Family Medicine

## 2017-01-18 DIAGNOSIS — I10 Essential (primary) hypertension: Secondary | ICD-10-CM

## 2017-01-18 NOTE — Telephone Encounter (Signed)
Attempted to contact pt regarding need for outpatient visit; last visit greater than 6 months ago; pt requested refill on norvasc; left message on [phone].

## 2017-01-25 ENCOUNTER — Other Ambulatory Visit: Payer: Self-pay | Admitting: Family Medicine

## 2017-01-25 DIAGNOSIS — M109 Gout, unspecified: Secondary | ICD-10-CM

## 2017-02-27 ENCOUNTER — Other Ambulatory Visit: Payer: Self-pay | Admitting: Family Medicine

## 2017-02-27 DIAGNOSIS — M109 Gout, unspecified: Secondary | ICD-10-CM

## 2017-03-01 NOTE — Telephone Encounter (Signed)
Per Dr. Nyoka Cowden 05/05/16 "Decrease allopurinol to 100mg  each day and return in 6 weeks to repeat kidney function testing."  Phone call to patient. Relayed information above, advised 30 days additional medication can be sent to pharmacy, but recommend he come in for office visit with Dr. Carlota Raspberry. He is agreeable. Transferred to front desk to schedule.

## 2017-03-02 ENCOUNTER — Other Ambulatory Visit: Payer: Self-pay

## 2017-03-02 ENCOUNTER — Encounter: Payer: Self-pay | Admitting: Family Medicine

## 2017-03-02 ENCOUNTER — Ambulatory Visit (INDEPENDENT_AMBULATORY_CARE_PROVIDER_SITE_OTHER): Payer: Medicare HMO | Admitting: Family Medicine

## 2017-03-02 VITALS — BP 111/73 | HR 66 | Temp 98.3°F | Resp 16 | Ht 69.0 in | Wt 187.0 lb

## 2017-03-02 DIAGNOSIS — M109 Gout, unspecified: Secondary | ICD-10-CM | POA: Diagnosis not present

## 2017-03-02 DIAGNOSIS — I10 Essential (primary) hypertension: Secondary | ICD-10-CM | POA: Diagnosis not present

## 2017-03-02 DIAGNOSIS — R7989 Other specified abnormal findings of blood chemistry: Secondary | ICD-10-CM | POA: Diagnosis not present

## 2017-03-02 DIAGNOSIS — Z1211 Encounter for screening for malignant neoplasm of colon: Secondary | ICD-10-CM

## 2017-03-02 DIAGNOSIS — Z13220 Encounter for screening for lipoid disorders: Secondary | ICD-10-CM

## 2017-03-02 DIAGNOSIS — Z136 Encounter for screening for cardiovascular disorders: Secondary | ICD-10-CM

## 2017-03-02 DIAGNOSIS — R221 Localized swelling, mass and lump, neck: Secondary | ICD-10-CM

## 2017-03-02 DIAGNOSIS — M1A9XX0 Chronic gout, unspecified, without tophus (tophi): Secondary | ICD-10-CM

## 2017-03-02 MED ORDER — AMLODIPINE BESYLATE 5 MG PO TABS: 5.0000 mg | ORAL_TABLET | Freq: Every day | ORAL | 2 refills | Status: DC

## 2017-03-02 MED ORDER — LISINOPRIL 20 MG PO TABS: 20.0000 mg | ORAL_TABLET | Freq: Every day | ORAL | 2 refills | Status: DC

## 2017-03-02 MED ORDER — ALLOPURINOL 100 MG PO TABS: 100.0000 mg | ORAL_TABLET | Freq: Every day | ORAL | 2 refills | Status: DC

## 2017-03-02 NOTE — Patient Instructions (Addendum)
  No change in medications for now. Based on calculated heart disease risk, if cholesterol is at same level as previous readings, may need to consider statin medication. However we will initially check labs and then if they are elevated may need to repeat those after fasting for 8 hours.   I referred you to general surgery for the suspected lipoma, gastroenterology for colon cancer screening, and have placed a referral for the abdominal aortic aneurysm screening. Please follow-up in April for physical and to discuss other health maintenance items at that time. Let me know if there are questions prior.   IF you received an x-ray today, you will receive an invoice from [facility]. Please contact [facility] at [phone] with questions or concerns regarding your invoice.   IF you received labwork today, you will receive an invoice from [place]. Please contact [facility] at [phone] with questions or concerns regarding your invoice.   Our billing staff will not be able to assist you with questions regarding bills from these companies.  You will be contacted with the lab results as soon as they are available. The fastest way to get your results is to activate your My Chart account. Instructions are located on the last page of this paperwork. If you have not heard from [place] regarding the results in 2 weeks, please contact this office.

## 2017-03-02 NOTE — Progress Notes (Signed)
Subjective:  By signing my name below, I, Matthew Simon, attest that this documentation has been prepared under the direction and in the presence of Matthew Simon. Electronically Signed: Moises Simon, [place]. 03/02/2017 , 3:26 PM .  Patient was seen in Room 14 .   Patient ID: Matthew Simon, Simon    DOB: [birth year], 67 y.o.   MRN: [id number] Chief Complaint  Patient presents with  . Medication Refill    patient requesting refills of lisinopril, states that he is using an old 71m rx. Patient denies any further concerns at this time   HPI Matthew Meddingsis a 67y.o. Simon  Here for follow up, last seen in April 2018 for a physical. He quit smoking in 1996. Last meal about 4 hours ago.   Lipoma - posterior neck He mentions lipoma on the back of his neck, bothering him. He hasn't met with a general surgeon in a while.   Gout He was improved off HCTZ last year, decreased allopurinol to 1064m He's been taking the 10021mHe denies any gout flares since April 2018.   Lab Results  Component Value Date   LABURIC 4.3 05/05/2016   HTN Lab Results  Component Value Date   CREATININE 1.55 (H) 05/04/2016   His creatinine has ranged from 1.07 to 1.28 prior to last reading. Recommended repeat test in 6 weeks. He denies taking ibuprofen. He drinks plenty of water daily. He takes Norvasc 5mg76m and lisinopril 20mg18m   He doesn't check his BP outside of office. He denies complications with his medications. He's been taking half tablet of old prescription of lisinopril 40mg 59mg) 51m  BP Readings from Last 3 Encounters:  03/02/17 111/73  05/25/16 (!) 143/76  05/23/16 (!) 148/82   Lipid screening Lab Results  Component Value Date   CHOL 218 (H) 05/04/2016   HDL 71 05/04/2016   LDLCALC 131 (H) 05/04/2016   TRIG 81 05/04/2016   CHOLHDL 3.1 05/04/2016     Patient Active Problem List   Diagnosis Date Noted  . Gout 05/05/2016  . Hypertension 04/10/2011  . Colon polyp 04/10/2011     Past Medical History:  Diagnosis Date  . Gout   . Hypertension    No past surgical history on file. No Known Allergies Prior to Admission medications   Medication Sig Start Date End Date Taking? Authorizing Provider  allopurinol (ZYLOPRIM) 100 MG tablet TAKE 1 TABLET BY MOUTH EVERY DAY 03/01/17   Jannel Lynne,Wendie Agrestellopurinol (ZYLOPRIM) 300 MG tablet TAKE 1 TABLET (300 MG TOTAL) BY MOUTH DAILY. 05/12/16   Provider, Historical, Simon  amLODipine (NORVASC) 5 MG tablet TAKE 1 TABLET BY MOUTH EVERY DAY 01/18/17   Meliyah Simon,Wendie Agrestespirin 81 MG tablet Take 81 mg by mouth 2 (two) times a week. Reported on 02/21/2015    Provider, Historical, Simon  lisinopril (PRINIVIL,ZESTRIL) 20 MG tablet TAKE 1 TABLET BY MOUTH EVERY DAY 12/25/16   Tyrone Balash,Wendie AgresteSocial History   Socioeconomic History  . Marital status: Married    Spouse name: Not on file  . Number of children: Not on file  . Years of education: Not on file  . Highest education level: Not on file  Social Needs  . Financial resource strain: Not on file  . Food insecurity - worry: Not on file  . Food insecurity - inability: Not on file  . Transportation needs - medical: Not on file  .  Transportation needs - non-medical: Not on file  Occupational History  . Occupation: business development  Tobacco Use  . Smoking status: Former Smoker    Last attempt to quit: 05/10/1964    Years since quitting: 52.8  . Smokeless tobacco: Never Used  Substance and Sexual Activity  . Alcohol use: Yes    Alcohol/week: 12.6 oz    Types: 21 Standard drinks or equivalent per week    Comment: wine with dinner  . Drug use: No  . Sexual activity: Not on file  Other Topics Concern  . Not on file  Social History Narrative  . Not on file   Review of Systems  Constitutional: Negative for fatigue and unexpected weight change.  Eyes: Negative for visual disturbance.  Respiratory: Negative for cough, chest tightness and shortness of breath.    Cardiovascular: Negative for chest pain, palpitations and leg swelling.  Gastrointestinal: Negative for abdominal pain and Simon in stool.  Neurological: Negative for dizziness, light-headedness and headaches.       Objective:   Physical Exam  Constitutional: He is oriented to person, place, and time. He appears well-developed and well-nourished.  HENT:  Head: Normocephalic and atraumatic.  Eyes: EOM are normal. Pupils are equal, round, and reactive to light.  Neck: No JVD present. Carotid bruit is not present.  Cardiovascular: Normal rate, regular rhythm and normal heart sounds.  No murmur heard. Pulmonary/Chest: Effort normal and breath sounds normal. He has no rales.  Musculoskeletal: He exhibits no edema.  Neurological: He is alert and oriented to person, place, and time.  Skin: Skin is warm and dry.  Approximately 4x6cm soft tissue prominence posterior neck  Psychiatric: He has a normal mood and affect.  Vitals reviewed.   Vitals:   03/02/17 1434  BP: 111/73  Pulse: 66  Resp: 16  Temp: 98.3 F (36.8 C)  TempSrc: Oral  SpO2: 98%  Weight: 187 lb (84.8 kg)  Height: '5\' 9"'  (1.753 m)      Assessment & Plan:   Matthew Simon Essential hypertension - Plan: Comprehensive metabolic panel, lisinopril (PRINIVIL,ZESTRIL) 20 MG tablet, amLODipine (NORVASC) 5 MG tablet  -  Stable, tolerating current regimen. Medications refilled. Labs pending as above.   Chronic gout without tophus, unspecified cause, unspecified site Gout, unspecified cause, unspecified chronicity, unspecified site - Plan: allopurinol (ZYLOPRIM) 100 MG tablet, Uric Acid  - stable, continue allopurinol, check uric acid.   Screening for hyperlipidemia - Plan: Lipid panel  - if persistent elevated ASCVD risk, likely will recommend statin. May need 8hr fasting labs.   Elevated serum creatinine - Plan: Comprehensive metabolic panel  - check labs to determine if changes based on readings last  year.    Screening for AAA (abdominal aortic aneurysm) - Plan: US Abdomen Limited  - based on age and prior smoking hx (remote)   Neck mass - Plan: Ambulatory referral to General Surgery  - suspected lipoma with more recent irritation refer to general surgery.   Screen for colon cancer - Plan: Ambulatory referral to Gastroenterology   Meds ordered this encounter  Medications  . lisinopril (PRINIVIL,ZESTRIL) 20 MG tablet    Sig: Take 1 tablet (20 mg total) by mouth daily.    Dispense:  90 tablet    Refill:  2  . amLODipine (NORVASC) 5 MG tablet    Sig: Take 1 tablet (5 mg total) by mouth daily.    Dispense:  90 tablet    Refill:  2  .  allopurinol (ZYLOPRIM) 100 MG tablet    Sig: Take 1 tablet (100 mg total) by mouth daily.    Dispense:  90 tablet    Refill:  2   Patient Instructions    No change in medications for now. Based on calculated heart disease risk, if cholesterol is at same level as previous readings, may need to consider statin medication. However we will initially check labs and then if they are elevated may need to repeat those after fasting for 8 hours.   I referred you to general surgery for the suspected lipoma, gastroenterology for colon cancer screening, and have placed a referral for the abdominal aortic aneurysm screening. Please follow-up in April for physical and to discuss other health maintenance items at that time. Let me know if there are questions prior.   IF you received an x-Simon today, you will receive an invoice from [facility]. Please contact [facility] at [phone] with questions or concerns regarding your invoice.   IF you received labwork today, you will receive an invoice from [facility]. Please contact [facility] at [phone] with questions or concerns regarding your invoice.   Our billing staff will not be able to assist you with questions regarding bills from these companies.  You will be contacted with the lab results  as soon as they are available. The fastest way to get your results is to activate your My Chart account. Instructions are located on the last page of this paperwork. If you have not heard from [place] regarding the results in 2 weeks, please contact this office.      I personally performed the services described in this documentation, which was scribed in my presence. The recorded information has been reviewed and considered for accuracy and completeness, addended by me as needed, and agree with information above.  Signed,   Matthew Simon [facility] [place].  03/04/17 10:10 PM

## 2017-03-03 LAB — COMPREHENSIVE METABOLIC PANEL
A/G RATIO: 1.8 (ref 1.2–2.2)
ALT: 19 IU/L (ref 0–44)
AST: 25 IU/L (ref 0–40)
Albumin: 4.7 g/dL (ref 3.6–4.8)
Alkaline Phosphatase: 43 IU/L (ref 39–117)
BUN/Creatinine Ratio: 14 (ref 10–24)
BUN: 21 mg/dL (ref 8–27)
Bilirubin Total: 0.4 mg/dL (ref 0.0–1.2)
CALCIUM: 9.7 mg/dL (ref 8.6–10.2)
CO2: 23 mmol/L (ref 20–29)
CREATININE: 1.5 mg/dL — AB (ref 0.76–1.27)
Chloride: 100 mmol/L (ref 96–106)
GFR calc Af Amer: 55 mL/min/{1.73_m2} — ABNORMAL LOW (ref >59)
GFR, EST NON AFRICAN AMERICAN: 47 mL/min/{1.73_m2} — AB (ref >59)
GLOBULIN, TOTAL: 2.6 g/dL (ref 1.5–4.5)
Glucose: 94 mg/dL (ref 65–99)
POTASSIUM: 4.1 mmol/L (ref 3.5–5.2)
SODIUM: 143 mmol/L (ref 134–144)
Total Protein: 7.3 g/dL (ref 6.0–8.5)

## 2017-03-03 LAB — LIPID PANEL
CHOL/HDL RATIO: 2.8 ratio (ref 0.0–5.0)
CHOLESTEROL TOTAL: 234 mg/dL — AB (ref 100–199)
HDL: 84 mg/dL (ref >39)
LDL Calculated: 127 mg/dL — ABNORMAL HIGH (ref 0–99)
TRIGLYCERIDES: 113 mg/dL (ref 0–149)
VLDL Cholesterol Cal: 23 mg/dL (ref 5–40)

## 2017-03-03 LAB — URIC ACID: Uric Acid: 7 mg/dL (ref 3.7–8.6)

## 2017-03-10 NOTE — Progress Notes (Signed)
Letter sent.

## 2017-03-12 ENCOUNTER — Encounter: Payer: Self-pay | Admitting: Gastroenterology

## 2017-03-22 ENCOUNTER — Telehealth: Payer: Self-pay | Admitting: Family Medicine

## 2017-03-22 DIAGNOSIS — Z136 Encounter for screening for cardiovascular disorders: Secondary | ICD-10-CM

## 2017-03-22 NOTE — Telephone Encounter (Signed)
Please call [facility] - order has been changed.

## 2017-03-22 NOTE — Telephone Encounter (Signed)
Copied from [place]. Topic: Quick Communication - Lab Results >> Mar 22, 2017  1:15 PM Robina Ade, Helene Kelp D wrote: Patient called and would like to talk to Dr. Carlota Raspberry about his lab results. I offered him to talk to a nurse triage but he said he wants to talk to provider. Please call patient back, thanks.

## 2017-03-22 NOTE — Telephone Encounter (Signed)
Received fax from [facility] requesting order for U/S Abdomen Limited be changed to U/S aorta AAA screening. They are requesting a call back to let them know when this is done. Thanks!

## 2017-03-23 NOTE — Telephone Encounter (Signed)
Called patient. He had question regarding statin recommendation. Initial calculation below.   The 10-year ASCVD risk score Mikey Bussing DC Brooke Bonito., et al., 2013) is: 21.7%   Values used to calculate the score:     Age: 67 years     Sex: Male     Is Non-Hispanic African American: Yes     Diabetic: Yes     Tobacco smoker: No     Systolic Blood Pressure: 358 mmHg     Is BP treated: Yes     HDL Cholesterol: 84 mg/dL     Total Cholesterol: 234 mg/dL  However, he does not have diabetes at this time (?possible prediabetes prior).  Lab Results  Component Value Date   HGBA1C 5.0 05/04/2016   Recalculated without diabetes and ASCVD risk 12%.  He will try diet/exercise with goal of getting LDL under 70, repeat lipid testing in 3-4 months.

## 2017-04-08 ENCOUNTER — Ambulatory Visit
Admission: RE | Admit: 2017-04-08 | Discharge: 2017-04-08 | Disposition: A | Discharge status: Home / Self Care | Payer: 59 | Source: Ambulatory Visit | Attending: Family Medicine | Admitting: Family Medicine

## 2017-04-08 DIAGNOSIS — I714 Abdominal aortic aneurysm, without rupture: Secondary | ICD-10-CM | POA: Diagnosis not present

## 2017-04-08 DIAGNOSIS — Z136 Encounter for screening for cardiovascular disorders: Secondary | ICD-10-CM

## 2017-04-09 DIAGNOSIS — M799 Soft tissue disorder, unspecified: Secondary | ICD-10-CM | POA: Diagnosis not present

## 2017-04-14 ENCOUNTER — Ambulatory Visit: Payer: Self-pay | Admitting: Surgery

## 2017-04-30 ENCOUNTER — Ambulatory Visit (AMBULATORY_SURGERY_CENTER): Payer: Self-pay

## 2017-04-30 VITALS — Ht 70.0 in | Wt 188.6 lb

## 2017-04-30 DIAGNOSIS — Z1211 Encounter for screening for malignant neoplasm of colon: Secondary | ICD-10-CM

## 2017-04-30 NOTE — Progress Notes (Signed)
Per pt, no allergies to soy or egg products.Pt not taking any weight loss meds or using  O2 at home.  Pt refused emmi video. 

## 2017-05-03 ENCOUNTER — Encounter: Payer: Self-pay | Admitting: Gastroenterology

## 2017-05-12 ENCOUNTER — Other Ambulatory Visit: Payer: Self-pay

## 2017-05-12 ENCOUNTER — Ambulatory Visit (AMBULATORY_SURGERY_CENTER): Payer: Medicare HMO | Admitting: Gastroenterology

## 2017-05-12 ENCOUNTER — Encounter: Payer: Self-pay | Admitting: Gastroenterology

## 2017-05-12 VITALS — BP 154/87 | HR 52 | Temp 97.1°F | Resp 15 | Ht 69.0 in | Wt 187.0 lb

## 2017-05-12 DIAGNOSIS — D123 Benign neoplasm of transverse colon: Secondary | ICD-10-CM

## 2017-05-12 DIAGNOSIS — D122 Benign neoplasm of ascending colon: Secondary | ICD-10-CM | POA: Diagnosis not present

## 2017-05-12 DIAGNOSIS — Z1211 Encounter for screening for malignant neoplasm of colon: Secondary | ICD-10-CM | POA: Diagnosis present

## 2017-05-12 MED ORDER — SODIUM CHLORIDE 0.9 % IV SOLN: 500.0000 mL | Freq: Once | INTRAVENOUS | Status: DC

## 2017-05-12 NOTE — Progress Notes (Signed)
To PACU, VSS. Report to RN.tb 

## 2017-05-12 NOTE — Op Note (Signed)
[place] Patient Name: Matthew Simon Procedure Date: 05/12/2017 10:08 AM MRN: [id number] Endoscopist: Remo Lipps P. Armbruster MD, MD Age: 67 Referring MD:  Date of Birth: 04-04-1950 Gender: Male Account #: [id number] Procedure:                Colonoscopy Indications:              Screening for colorectal malignant neoplasm Medicines:                Monitored Anesthesia Care Procedure:                Pre-Anesthesia Assessment:                           - Prior to the procedure, a History and Physical                            was performed, and patient medications and                            allergies were reviewed. The patient's tolerance of                            previous anesthesia was also reviewed. The risks                            and benefits of the procedure and the sedation                            options and risks were discussed with the patient.                            All questions were answered, and informed consent                            was obtained. Prior Anticoagulants: The patient has                            taken no previous anticoagulant or antiplatelet                            agents. ASA Grade Assessment: II - A patient with                            mild systemic disease. After reviewing the risks                            and benefits, the patient was deemed in                            satisfactory condition to undergo the procedure.                           After obtaining informed consent, the colonoscope  was passed under direct vision. Throughout the                            procedure, the patient's blood pressure, pulse, and                            oxygen saturations were monitored continuously. The                            Colonoscope was introduced through the anus and                            advanced to the the cecum, identified by                            appendiceal orifice  and ileocecal valve. The                            colonoscopy was performed without difficulty. The                            patient tolerated the procedure well. The quality                            of the bowel preparation was adequate. The                            ileocecal valve, appendiceal orifice, and rectum                            were photographed. Scope In: 10:19:36 AM Scope Out: 10:45:40 AM Scope Withdrawal Time: 0 hours 19 minutes 11 seconds  Total Procedure Duration: 0 hours 26 minutes 4 seconds  Findings:                 The perianal and digital rectal examinations were                            normal.                           A 3 mm polyp was found in the ascending colon. The                            polyp was sessile. The polyp was removed with a                            cold snare. Resection and retrieval were complete.                           A 3 mm polyp was found in the transverse colon. The                            polyp was sessile. The polyp was removed with a  cold snare. Resection and retrieval were complete.                           Four sessile polyps were found in the transverse                            colon. The polyps were diminutive in size. These                            polyps were removed with a cold biopsy forceps.                            Resection and retrieval were complete.                           Patchy linear areas of edematous mucosa was found                            in the transverse colon and in the ascending colon,                            potentially consistent with pneumotosis or fluid                            filled subepithelial cystic lesion. Bite on bite                            biopsies were taken with a cold forceps for                            histology.                           Scattered medium-mouthed diverticula were found in                            the entire  colon.                           Internal hemorrhoids were found during retroflexion.                           The exam was otherwise without abnormality. Complications:            No immediate complications. Estimated blood loss:                            Minimal. Estimated Blood Loss:     Estimated blood loss was minimal. Impression:               - One 3 mm polyp in the ascending colon, removed                            with a cold snare. Resected and retrieved.                           -  One 3 mm polyp in the transverse colon, removed                            with a cold snare. Resected and retrieved.                           - Four diminutive polyps in the transverse colon,                            removed with a cold biopsy forceps. Resected and                            retrieved.                           - Edematous mucosa in the transverse colon and in                            the ascending colon as described. Biopsied.                           - Diverticulosis in the entire examined colon.                           - Internal hemorrhoids.                           - The examination was otherwise normal. Recommendation:           - Patient has a contact number available for                            emergencies. The signs and symptoms of potential                            delayed complications were discussed with the                            patient. Return to normal activities tomorrow.                            Written discharge instructions were provided to the                            patient.                           - Resume previous diet.                           - Continue present medications.                           - Await pathology results.                           -  Repeat colonoscopy is recommended for                            surveillance. The colonoscopy date will be                            determined after pathology results from  today's                            exam become available for review. Remo Lipps P. Armbruster MD, MD 05/12/2017 10:53:48 AM This report has been signed electronically.

## 2017-05-12 NOTE — Progress Notes (Signed)
Called to room to assist during endoscopic procedure.  Patient ID and intended procedure confirmed with present staff. Received instructions for my participation in the procedure from the performing physician.  

## 2017-05-12 NOTE — Patient Instructions (Signed)
   Information on polyps,diverticulosis,& hemorrhoids givn to you today  Await pathology results on polyps removed & biopsies taken   YOU HAD AN ENDOSCOPIC PROCEDURE TODAY AT [facility]:   Refer to the procedure report that was given to you for any specific questions about what was found during the examination.  If the procedure report does not answer your questions, please call your gastroenterologist to clarify.  If you requested that your care partner not be given the details of your procedure findings, then the procedure report has been included in a sealed envelope for you to review at your convenience later.  YOU SHOULD EXPECT: Some feelings of bloating in the abdomen. Passage of more gas than usual.  Walking can help get rid of the air that was put into your GI tract during the procedure and reduce the bloating. If you had a lower endoscopy (such as a colonoscopy or flexible sigmoidoscopy) you may notice spotting of blood in your stool or on the toilet paper. If you underwent a bowel prep for your procedure, you may not have a normal bowel movement for a few days.  Please Note:  You might notice some irritation and congestion in your nose or some drainage.  This is from the oxygen used during your procedure.  There is no need for concern and it should clear up in a day or so.  SYMPTOMS TO REPORT IMMEDIATELY:   Following lower endoscopy (colonoscopy or flexible sigmoidoscopy):  Excessive amounts of blood in the stool  Significant tenderness or worsening of abdominal pains  Swelling of the abdomen that is new, acute  Fever of 100F or higher    For urgent or emergent issues, a gastroenterologist can be reached at any hour by calling [phone].   DIET:  We do recommend a small meal at first, but then you may proceed to your regular diet.  Drink plenty of fluids but you should avoid alcoholic beverages for 24 hours.  ACTIVITY:  You should plan to take it easy for  the rest of today and you should NOT DRIVE or use heavy machinery until tomorrow (because of the sedation medicines used during the test).    FOLLOW UP: Our staff will call the number listed on your records the next business day following your procedure to check on you and address any questions or concerns that you may have regarding the information given to you following your procedure. If we do not reach you, we will leave a message.  However, if you are feeling well and you are not experiencing any problems, there is no need to return our call.  We will assume that you have returned to your regular daily activities without incident.  If any biopsies were taken you will be contacted by phone or by letter within the next 1-3 weeks.  Please call us at [phone] if you have not heard about the biopsies in 3 weeks.    SIGNATURES/CONFIDENTIALITY: You and/or your care partner have signed paperwork which will be entered into your electronic medical record.  These signatures attest to the fact that that the information above on your After Visit Summary has been reviewed and is understood.  Full responsibility of the confidentiality of this discharge information lies with you and/or your care-partner.

## 2017-05-12 NOTE — Progress Notes (Signed)
Pt's states no medical or surgical changes since previsit or office visit. 

## 2017-05-13 ENCOUNTER — Telehealth: Payer: Self-pay | Admitting: *Deleted

## 2017-05-13 NOTE — Telephone Encounter (Signed)
  Follow up Call-  Call back number 05/12/2017  Post procedure Call Back phone  # [phone]  Permission to leave phone message Yes  Some recent data might be hidden     Patient questions:  Do you have a fever, pain , or abdominal swelling? No. Pain Score  0 *  Have you tolerated food without any problems? Yes.    Have you been able to return to your normal activities? Yes.    Do you have any questions about your discharge instructions: Diet   No. Medications  No. Follow up visit  No.  Do you have questions or concerns about your Care? No.  Actions: * If pain score is 4 or above: No action needed, pain <4.

## 2017-05-17 NOTE — Pre-Procedure Instructions (Signed)
Matthew Simon  05/17/2017      [id number] - [place], [place] [address] [place] [address] Phone: [phone] Fax: [phone]    Your procedure is scheduled on April 22  Report to [facility] at [place].M.  Call this number if you have problems the morning of surgery:  [phone]   Remember:  Do not eat food or drink liquids after midnight.  Continue all medications as directed by your physician except follow these medication instructions before surgery below   Take these medicines the morning of surgery with A SIP OF WATER  allopurinol (ZYLOPRIM) amLODipine (NORVASC)   7 days prior to surgery STOP taking any Aspirin(unless otherwise instructed by your surgeon), Aleve, Naproxen, Ibuprofen, Motrin, Advil, Goody's, BC's, all herbal medications, fish oil, and all vitamins    Do not wear jewelry  Do not wear lotions, powders, or cologne, or deodorant.  Men may shave face and neck.  Do not bring valuables to the hospital.  [facility] is not responsible for any belongings or valuables.  Contacts, dentures or bridgework may not be worn into surgery.  Leave your suitcase in the car.  After surgery it may be brought to your room.  For patients admitted to the hospital, discharge time will be determined by your treatment team.  Patients discharged the day of surgery will not be allowed to drive home.    Special instructions:   [facility]- Preparing For Surgery  Before surgery, you can play an important role. Because skin is not sterile, your skin needs to be as free of germs as possible. You can reduce the number of germs on your skin by washing with CHG (chlorahexidine gluconate) Soap before surgery.  CHG is an antiseptic cleaner which kills germs and bonds with the skin to continue killing germs even after washing.  Please do not use if you have an allergy to CHG or  antibacterial soaps. If your skin becomes reddened/irritated stop using the CHG.  Do not shave (including legs and underarms) for at least 48 hours prior to first CHG shower. It is OK to shave your face.  Please follow these instructions carefully.   1. Shower the NIGHT BEFORE SURGERY and the MORNING OF SURGERY with CHG.   2. If you chose to wash your hair, wash your hair first as usual with your normal shampoo.  3. After you shampoo, rinse your hair and body thoroughly to remove the shampoo.  4. Use CHG as you would any other liquid soap. You can apply CHG directly to the skin and wash gently with a scrungie or a clean washcloth.   5. Apply the CHG Soap to your body ONLY FROM THE NECK DOWN.  Do not use on open wounds or open sores. Avoid contact with your eyes, ears, mouth and genitals (private parts). Wash Face and genitals (private parts)  with your normal soap.  6. Wash thoroughly, paying special attention to the area where your surgery will be performed.  7. Thoroughly rinse your body with warm water from the neck down.  8. DO NOT shower/wash with your normal soap after using and rinsing off the CHG Soap.  9. Pat yourself dry with a CLEAN TOWEL.  10. Wear CLEAN PAJAMAS to bed the night before surgery, wear comfortable clothes the morning of surgery  11. Place CLEAN SHEETS on your bed the night of your first shower  and DO NOT SLEEP WITH PETS.    Day of Surgery: Do not apply any deodorants/lotions. Please wear clean clothes to the hospital/surgery center.      Please read over the following fact sheets that you were given.

## 2017-05-18 ENCOUNTER — Other Ambulatory Visit: Payer: Self-pay

## 2017-05-18 ENCOUNTER — Encounter (HOSPITAL_COMMUNITY)
Admission: RE | Admit: 2017-05-18 | Discharge: 2017-05-18 | Disposition: A | Discharge status: Home / Self Care | Payer: Medicare HMO | Source: Ambulatory Visit | Attending: Surgery | Admitting: Surgery

## 2017-05-18 ENCOUNTER — Encounter (HOSPITAL_COMMUNITY): Payer: Self-pay

## 2017-05-18 DIAGNOSIS — Z01812 Encounter for preprocedural laboratory examination: Secondary | ICD-10-CM | POA: Diagnosis not present

## 2017-05-18 DIAGNOSIS — Z01810 Encounter for preprocedural cardiovascular examination: Secondary | ICD-10-CM | POA: Diagnosis not present

## 2017-05-18 DIAGNOSIS — R001 Bradycardia, unspecified: Secondary | ICD-10-CM | POA: Diagnosis not present

## 2017-05-18 LAB — CBC
HCT: 38.3 % — ABNORMAL LOW (ref 39.0–52.0)
Hemoglobin: 12.1 g/dL — ABNORMAL LOW (ref 13.0–17.0)
MCH: 28.5 pg (ref 26.0–34.0)
MCHC: 31.6 g/dL (ref 30.0–36.0)
MCV: 90.1 fL (ref 78.0–100.0)
PLATELETS: 283 10*3/uL (ref 150–400)
RBC: 4.25 MIL/uL (ref 4.22–5.81)
RDW: 12.7 % (ref 11.5–15.5)
WBC: 5.2 10*3/uL (ref 4.0–10.5)

## 2017-05-18 LAB — BASIC METABOLIC PANEL
Anion gap: 8 (ref 5–15)
BUN: 14 mg/dL (ref 6–20)
CALCIUM: 9.5 mg/dL (ref 8.9–10.3)
CO2: 27 mmol/L (ref 22–32)
CREATININE: 1.22 mg/dL (ref 0.61–1.24)
Chloride: 105 mmol/L (ref 101–111)
GFR calc Af Amer: >60 mL/min (ref >60)
GFR, EST NON AFRICAN AMERICAN: 60 mL/min — AB (ref >60)
GLUCOSE: 100 mg/dL — AB (ref 65–99)
POTASSIUM: 3.7 mmol/L (ref 3.5–5.1)
SODIUM: 140 mmol/L (ref 135–145)

## 2017-05-18 NOTE — Progress Notes (Signed)
PCP - [facility] Ray Cardiologist - denies  Chest x-ray - not needed EKG - 05/18/17 Stress Test - denies ECHO - denies Cardiac Cath - denies   Anesthesia review: NO  Patient denies shortness of breath, fever, cough and chest pain at PAT appointment   Patient verbalized understanding of instructions that were given to them at the PAT appointment. Patient was also instructed that they will need to review over the PAT instructions again at home before surgery.

## 2017-05-22 ENCOUNTER — Encounter (HOSPITAL_COMMUNITY): Payer: Self-pay | Admitting: Surgery

## 2017-05-22 DIAGNOSIS — M7989 Other specified soft tissue disorders: Secondary | ICD-10-CM | POA: Diagnosis present

## 2017-05-22 NOTE — H&P (Signed)
General Surgery [facility]  Matthew Simon DOB: Jan 06, 1951 Married / Language: English / Race: Black or African American Male   History of Present Illness  The patient is a 67 year old male who presents with a neck mass.  CC: posterior neck mass  Patient returns for evaluation and recommendations for management on a posterior neck mass. Patient was previously evaluated for this problem in 2017. Due to a variety of concerns he never underwent surgical excision. He returns today to discuss surgical excision of the mass. It may have gradually increased slightly in size. It causes him some Simon discomfort on occasion. He has had no drainage or signs of infection. He has no other such lesions present. He is accompanied today by his wife.   Allergies  No Known Drug Allergies [03/13/2015]:  Medication History AmLODIPine Besylate (5MG  Tablet, Oral) Active. Aspirin (81MG  Tablet, Oral) Active. Allopurinol (300MG  Tablet, Oral) Active. Lisinopril (20MG  Tablet, Oral) Active. Medications Reconciled  Vitals  Weight: 192.6 lb Height: 71in Body Surface Area: 2.08 m Body Mass Index: 26.86 kg/m  Temp.: 98.57F(Temporal)  Pulse: 68 (Regular)  BP: 132/80 (Sitting, Left Arm, Standard)  Physical Exam  See vital signs recorded above  GENERAL APPEARANCE Development: normal Nutritional status: normal Gross deformities: none  SKIN Rash, lesions, ulcers: none Induration, erythema: none Nodules: none palpable  EYES Conjunctiva and lids: normal Pupils: equal and reactive Iris: normal bilaterally  EARS, NOSE, MOUTH, THROAT External ears: no lesion or deformity External nose: no lesion or deformity Hearing: grossly normal Lips: no lesion or deformity Dentition: normal for age Oral mucosa: moist  NECK Symmetric: yes Trachea: midline Thyroid: no palpable nodules in the thyroid bed In the posterior neck in the midline is a 6 x 4.5 x 3 cm  soft tissue mass which is discrete and well defined, relatively mobile, and nontender. This has slightly increased in size compared to his prior examination.  CHEST Respiratory effort: normal Retraction or accessory muscle use: no Breath sounds: normal bilaterally Rales, rhonchi, wheeze: none  CARDIOVASCULAR Auscultation: regular rhythm, normal rate Murmurs: none Pulses: carotid and radial pulse 2+ palpable Lower extremity edema: none Lower extremity varicosities: none  MUSCULOSKELETAL Station and gait: normal Digits and nails: no clubbing or cyanosis Muscle strength: grossly normal all extremities Range of motion: grossly normal all extremities Deformity: none  LYMPHATIC Cervical: none palpable Supraclavicular: none palpable  PSYCHIATRIC Oriented to person, place, and time: yes Mood and affect: normal for situation Judgment and insight: appropriate for situation    Assessment & Plan Matthew Regal MD; 04/09/2017 4:01 PM) SOFT TISSUE MASS (M79.9) Current Plans Patient returns to my practice for evaluation and recommendations for management of a posterior neck mass. This has mildly increased in size over the interval between his office visits. He is now having some Simon discomfort.  I have recommended surgical excision. Due to the size and location of this mass, this should be done under general anesthesia in a prone position. This will be an outpatient surgery. We discussed the location of the surgical incision. We discussed restrictions on his activities after surgery. We will send the specimen to pathology for review. We discussed potential problems after surgery including seroma formation. Patient understands and wishes to proceed with surgery sometime this spring.  The risks and benefits of the procedure have been discussed at length with the patient. The patient understands the proposed procedure, potential alternative treatments, and the course of recovery to  be expected. All of the  patient's questions have been answered at this time. The patient wishes to proceed with surgery.  Matthew Simon, [facility] Office: [phone]

## 2017-05-24 ENCOUNTER — Encounter (HOSPITAL_COMMUNITY)
Admission: RE | Disposition: A | Discharge status: Home / Self Care | Payer: Self-pay | Source: Ambulatory Visit | Attending: Surgery

## 2017-05-24 ENCOUNTER — Ambulatory Visit (HOSPITAL_COMMUNITY): Payer: Medicare HMO | Admitting: Certified Registered"

## 2017-05-24 ENCOUNTER — Ambulatory Visit (HOSPITAL_COMMUNITY)
Admission: RE | Admit: 2017-05-24 | Discharge: 2017-05-24 | Disposition: A | Discharge status: Home / Self Care | Payer: Medicare HMO | Source: Ambulatory Visit | Attending: Surgery | Admitting: Surgery

## 2017-05-24 ENCOUNTER — Encounter (HOSPITAL_COMMUNITY): Payer: Self-pay | Admitting: Certified Registered"

## 2017-05-24 DIAGNOSIS — Z7982 Long term (current) use of aspirin: Secondary | ICD-10-CM | POA: Insufficient documentation

## 2017-05-24 DIAGNOSIS — M109 Gout, unspecified: Secondary | ICD-10-CM | POA: Diagnosis not present

## 2017-05-24 DIAGNOSIS — D170 Benign lipomatous neoplasm of skin and subcutaneous tissue of head, face and neck: Secondary | ICD-10-CM | POA: Diagnosis not present

## 2017-05-24 DIAGNOSIS — Z79899 Other long term (current) drug therapy: Secondary | ICD-10-CM | POA: Diagnosis not present

## 2017-05-24 DIAGNOSIS — Z87891 Personal history of nicotine dependence: Secondary | ICD-10-CM | POA: Diagnosis not present

## 2017-05-24 DIAGNOSIS — M7989 Other specified soft tissue disorders: Secondary | ICD-10-CM | POA: Diagnosis present

## 2017-05-24 DIAGNOSIS — I10 Essential (primary) hypertension: Secondary | ICD-10-CM | POA: Insufficient documentation

## 2017-05-24 DIAGNOSIS — R221 Localized swelling, mass and lump, neck: Secondary | ICD-10-CM | POA: Diagnosis not present

## 2017-05-24 DIAGNOSIS — D1739 Benign lipomatous neoplasm of skin and subcutaneous tissue of other sites: Secondary | ICD-10-CM | POA: Diagnosis not present

## 2017-05-24 DIAGNOSIS — K635 Polyp of colon: Secondary | ICD-10-CM | POA: Diagnosis not present

## 2017-05-24 HISTORY — PX: EXCISION MASS NECK: SHX6703

## 2017-05-24 SURGERY — EXCISION, MASS, NECK
Anesthesia: General | Site: Neck

## 2017-05-24 MED ORDER — MEPERIDINE HCL 50 MG/ML IJ SOLN: 6.2500 mg | INTRAMUSCULAR | Status: DC | PRN

## 2017-05-24 MED ORDER — OXYCODONE HCL 5 MG PO TABS
ORAL_TABLET | ORAL | Status: AC
  Filled 2017-05-24: qty 1

## 2017-05-24 MED ORDER — DEXAMETHASONE SODIUM PHOSPHATE 4 MG/ML IJ SOLN
INTRAMUSCULAR | Status: DC | PRN
  Administered 2017-05-24: 8 mg via INTRAVENOUS

## 2017-05-24 MED ORDER — EPHEDRINE 5 MG/ML INJ
INTRAVENOUS | Status: AC
  Filled 2017-05-24: qty 10

## 2017-05-24 MED ORDER — OXYCODONE HCL 5 MG PO TABS
5.0000 mg | ORAL_TABLET | Freq: Once | ORAL | Status: AC | PRN
  Administered 2017-05-24: 5 mg via ORAL

## 2017-05-24 MED ORDER — SUGAMMADEX SODIUM 200 MG/2ML IV SOLN
INTRAVENOUS | Status: AC
  Filled 2017-05-24: qty 2

## 2017-05-24 MED ORDER — SUGAMMADEX SODIUM 200 MG/2ML IV SOLN
INTRAVENOUS | Status: DC | PRN
  Administered 2017-05-24: 200 mg via INTRAVENOUS

## 2017-05-24 MED ORDER — PROPOFOL 10 MG/ML IV BOLUS
INTRAVENOUS | Status: AC
  Filled 2017-05-24: qty 20

## 2017-05-24 MED ORDER — MIDAZOLAM HCL 2 MG/2ML IJ SOLN
INTRAMUSCULAR | Status: AC
  Filled 2017-05-24: qty 2

## 2017-05-24 MED ORDER — PROMETHAZINE HCL 25 MG/ML IJ SOLN: 6.2500 mg | INTRAMUSCULAR | Status: DC | PRN

## 2017-05-24 MED ORDER — FENTANYL CITRATE (PF) 100 MCG/2ML IJ SOLN
INTRAMUSCULAR | Status: DC | PRN
  Administered 2017-05-24 (×4): 50 ug via INTRAVENOUS

## 2017-05-24 MED ORDER — FENTANYL CITRATE (PF) 250 MCG/5ML IJ SOLN
INTRAMUSCULAR | Status: AC
  Filled 2017-05-24: qty 5

## 2017-05-24 MED ORDER — BUPIVACAINE-EPINEPHRINE (PF) 0.5% -1:200000 IJ SOLN
INTRAMUSCULAR | Status: AC
  Filled 2017-05-24: qty 30

## 2017-05-24 MED ORDER — ROCURONIUM BROMIDE 10 MG/ML (PF) SYRINGE
PREFILLED_SYRINGE | INTRAVENOUS | Status: AC
  Filled 2017-05-24: qty 5

## 2017-05-24 MED ORDER — CHLORHEXIDINE GLUCONATE CLOTH 2 % EX PADS: 6.0000 | MEDICATED_PAD | Freq: Once | CUTANEOUS | Status: DC

## 2017-05-24 MED ORDER — LACTATED RINGERS IV SOLN
INTRAVENOUS | Status: DC | PRN
  Administered 2017-05-24 (×2): via INTRAVENOUS

## 2017-05-24 MED ORDER — PHENYLEPHRINE 40 MCG/ML (10ML) SYRINGE FOR IV PUSH (FOR BLOOD PRESSURE SUPPORT)
PREFILLED_SYRINGE | INTRAVENOUS | Status: DC | PRN
  Administered 2017-05-24: 80 ug via INTRAVENOUS
  Administered 2017-05-24: 40 ug via INTRAVENOUS
  Administered 2017-05-24: 80 ug via INTRAVENOUS
  Administered 2017-05-24: 40 ug via INTRAVENOUS
  Administered 2017-05-24 (×2): 80 ug via INTRAVENOUS

## 2017-05-24 MED ORDER — HYDROCODONE-ACETAMINOPHEN 5-325 MG PO TABS: 1.0000 | ORAL_TABLET | Freq: Four times a day (QID) | ORAL | 0 refills | Status: DC | PRN

## 2017-05-24 MED ORDER — ARTIFICIAL TEARS OPHTHALMIC OINT
TOPICAL_OINTMENT | OPHTHALMIC | Status: DC | PRN
  Administered 2017-05-24: 1 via OPHTHALMIC

## 2017-05-24 MED ORDER — ONDANSETRON HCL 4 MG/2ML IJ SOLN
INTRAMUSCULAR | Status: AC
  Filled 2017-05-24: qty 2

## 2017-05-24 MED ORDER — LIDOCAINE 2% (20 MG/ML) 5 ML SYRINGE
INTRAMUSCULAR | Status: AC
  Filled 2017-05-24: qty 5

## 2017-05-24 MED ORDER — OXYCODONE HCL 5 MG/5ML PO SOLN: 5.0000 mg | Freq: Once | ORAL | Status: AC | PRN

## 2017-05-24 MED ORDER — ARTIFICIAL TEARS OPHTHALMIC OINT
TOPICAL_OINTMENT | OPHTHALMIC | Status: AC
  Filled 2017-05-24: qty 3.5

## 2017-05-24 MED ORDER — HYDROMORPHONE HCL 2 MG/ML IJ SOLN: 0.2500 mg | INTRAMUSCULAR | Status: DC | PRN

## 2017-05-24 MED ORDER — BUPIVACAINE-EPINEPHRINE (PF) 0.25% -1:200000 IJ SOLN
INTRAMUSCULAR | Status: AC
  Filled 2017-05-24: qty 30

## 2017-05-24 MED ORDER — DEXAMETHASONE SODIUM PHOSPHATE 10 MG/ML IJ SOLN
INTRAMUSCULAR | Status: AC
  Filled 2017-05-24: qty 1

## 2017-05-24 MED ORDER — MIDAZOLAM HCL 5 MG/5ML IJ SOLN
INTRAMUSCULAR | Status: DC | PRN
  Administered 2017-05-24: 2 mg via INTRAVENOUS

## 2017-05-24 MED ORDER — CEFAZOLIN SODIUM-DEXTROSE 2-4 GM/100ML-% IV SOLN
2.0000 g | INTRAVENOUS | Status: AC
  Administered 2017-05-24: 2 g via INTRAVENOUS
  Filled 2017-05-24: qty 100

## 2017-05-24 MED ORDER — BUPIVACAINE-EPINEPHRINE 0.25% -1:200000 IJ SOLN
INTRAMUSCULAR | Status: DC | PRN
  Administered 2017-05-24: 8 mL

## 2017-05-24 MED ORDER — EPHEDRINE SULFATE-NACL 50-0.9 MG/10ML-% IV SOSY
PREFILLED_SYRINGE | INTRAVENOUS | Status: DC | PRN
  Administered 2017-05-24 (×2): 5 mg via INTRAVENOUS

## 2017-05-24 MED ORDER — PROPOFOL 10 MG/ML IV BOLUS
INTRAVENOUS | Status: DC | PRN
  Administered 2017-05-24: 160 mg via INTRAVENOUS

## 2017-05-24 MED ORDER — GLYCOPYRROLATE 0.2 MG/ML IJ SOLN
INTRAMUSCULAR | Status: DC | PRN
  Administered 2017-05-24: 0.2 mg via INTRAVENOUS

## 2017-05-24 MED ORDER — ROCURONIUM BROMIDE 100 MG/10ML IV SOLN
INTRAVENOUS | Status: DC | PRN
  Administered 2017-05-24: 50 mg via INTRAVENOUS

## 2017-05-24 MED ORDER — PHENYLEPHRINE 40 MCG/ML (10ML) SYRINGE FOR IV PUSH (FOR BLOOD PRESSURE SUPPORT)
PREFILLED_SYRINGE | INTRAVENOUS | Status: AC
  Filled 2017-05-24: qty 10

## 2017-05-24 SURGICAL SUPPLY — 35 items
CHLORAPREP W/TINT 26ML (MISCELLANEOUS) ×2 IMPLANT
COVER SURGICAL LIGHT HANDLE (MISCELLANEOUS) ×2 IMPLANT
DRAPE HALF SHEET 40X57 (DRAPES) IMPLANT
DRAPE LAPAROTOMY 100X72 PEDS (DRAPES) ×1 IMPLANT
DRAPE UTILITY XL STRL (DRAPES) ×2 IMPLANT
ELECT CAUTERY BLADE 6.4 (BLADE) ×2 IMPLANT
ELECT REM PT RETURN 9FT ADLT (ELECTROSURGICAL) ×2
ELECTRODE REM PT RTRN 9FT ADLT (ELECTROSURGICAL) ×1 IMPLANT
GAUZE SPONGE 4X4 12PLY STRL (GAUZE/BANDAGES/DRESSINGS) ×2 IMPLANT
GAUZE SPONGE 4X4 16PLY XRAY LF (GAUZE/BANDAGES/DRESSINGS) ×2 IMPLANT
GLOVE BIOGEL PI IND STRL 7.0 (GLOVE) IMPLANT
GLOVE BIOGEL PI INDICATOR 7.0 (GLOVE) ×1
GLOVE SURG ORTHO 8.0 STRL STRW (GLOVE) ×2 IMPLANT
GOWN STRL REUS W/ TWL LRG LVL3 (GOWN DISPOSABLE) ×2 IMPLANT
GOWN STRL REUS W/ TWL XL LVL3 (GOWN DISPOSABLE) ×1 IMPLANT
GOWN STRL REUS W/TWL LRG LVL3 (GOWN DISPOSABLE) ×4
GOWN STRL REUS W/TWL XL LVL3 (GOWN DISPOSABLE) ×2
KIT BASIN OR (CUSTOM PROCEDURE TRAY) ×2 IMPLANT
KIT TURNOVER KIT B (KITS) ×2 IMPLANT
NDL HYPO 25GX1X1/2 BEV (NEEDLE) IMPLANT
NEEDLE HYPO 25GX1X1/2 BEV (NEEDLE) ×2 IMPLANT
NS IRRIG 1000ML POUR BTL (IV SOLUTION) ×2 IMPLANT
PACK SURGICAL SETUP 50X90 (CUSTOM PROCEDURE TRAY) ×2 IMPLANT
PAD ARMBOARD 7.5X6 YLW CONV (MISCELLANEOUS) ×4 IMPLANT
PENCIL BUTTON HOLSTER BLD 10FT (ELECTRODE) ×2 IMPLANT
SPECIMEN JAR SMALL (MISCELLANEOUS) IMPLANT
SPONGE LAP 18X18 X RAY DECT (DISPOSABLE) IMPLANT
STRIP CLOSURE SKIN 1/2X4 (GAUZE/BANDAGES/DRESSINGS) ×2 IMPLANT
SUT MNCRL AB 4-0 PS2 18 (SUTURE) ×3 IMPLANT
SUT VIC AB 3-0 SH 27 (SUTURE) ×2
SUT VIC AB 3-0 SH 27XBRD (SUTURE) ×1 IMPLANT
SYR BULB 3OZ (MISCELLANEOUS) ×2 IMPLANT
SYR CONTROL 10ML LL (SYRINGE) ×1 IMPLANT
TOWEL OR 17X24 6PK STRL BLUE (TOWEL DISPOSABLE) ×2 IMPLANT
TOWEL OR 17X26 10 PK STRL BLUE (TOWEL DISPOSABLE) IMPLANT

## 2017-05-24 NOTE — Transfer of Care (Signed)
Immediate Anesthesia Transfer of Care Note  Patient: Matthew Simon  Procedure(s) Performed: EXCISION POSTERIOR NECK MASS (N/A Neck)  Patient Location: PACU  Anesthesia Type:General  Level of Consciousness: drowsy  Airway & Oxygen Therapy: Patient Spontanous Breathing and Patient connected to face mask oxygen  Post-op Assessment: Report given to RN and Post -op Vital signs reviewed and stable  Post vital signs: Reviewed and stable  Last Vitals:  Vitals Value Taken Time  BP 124/72 05/24/2017  8:45 AM  Temp 36.5 C 05/24/2017  8:40 AM  Pulse 89 05/24/2017  8:53 AM  Resp 13 05/24/2017  8:53 AM  SpO2 92 % 05/24/2017  8:53 AM    Last Pain:  Vitals:   05/24/17 0616  TempSrc:   PainSc: 0-No pain         Complications: No apparent anesthesia complications

## 2017-05-24 NOTE — Anesthesia Preprocedure Evaluation (Signed)
Anesthesia Evaluation  Patient identified by MRN, date of birth, ID band Patient awake    Reviewed: Allergy & Precautions, NPO status , Patient's Chart, lab work & pertinent test results  Airway Mallampati: II  TM Distance: >3 FB Neck ROM: Full    Dental no notable dental hx.    Pulmonary neg pulmonary ROS, former smoker,    Pulmonary exam normal breath sounds clear to auscultation       Cardiovascular hypertension, Pt. on medications negative cardio ROS Normal cardiovascular exam Rhythm:Regular Rate:Normal     Neuro/Psych negative neurological ROS  negative psych ROS   GI/Hepatic negative GI ROS, Neg liver ROS,   Endo/Other  negative endocrine ROS  Renal/GU negative Renal ROS  negative genitourinary   Musculoskeletal negative musculoskeletal ROS (+)   Abdominal   Peds negative pediatric ROS (+)  Hematology negative hematology ROS (+)   Anesthesia Other Findings   Reproductive/Obstetrics negative OB ROS                             Anesthesia Physical Anesthesia Plan  ASA: II  Anesthesia Plan: General   Post-op Pain Management:    Induction: Intravenous  PONV Risk Score and Plan: 2 and Ondansetron and Midazolam  Airway Management Planned: Oral ETT  Additional Equipment:   Intra-op Plan:   Post-operative Plan: Extubation in OR  Informed Consent: I have reviewed the patients History and Physical, chart, labs and discussed the procedure including the risks, benefits and alternatives for the proposed anesthesia with the patient or authorized representative who has indicated his/her understanding and acceptance.   Dental advisory given  Plan Discussed with: CRNA  Anesthesia Plan Comments:         Anesthesia Quick Evaluation

## 2017-05-24 NOTE — Anesthesia Postprocedure Evaluation (Signed)
Anesthesia Post Note  Patient: Matthew Simon  Procedure(s) Performed: EXCISION POSTERIOR NECK MASS (N/A Neck)     Patient location during evaluation: PACU Anesthesia Type: General Level of consciousness: awake and alert Pain management: pain level controlled Vital Signs Assessment: post-procedure vital signs reviewed and stable Respiratory status: spontaneous breathing, nonlabored ventilation and respiratory function stable Cardiovascular status: blood pressure returned to baseline and stable Postop Assessment: no apparent nausea or vomiting Anesthetic complications: no    Last Vitals:  Vitals:   05/24/17 1000 05/24/17 1015  BP: 124/83 126/85  Pulse: 88 76  Resp: 17 15  Temp:    SpO2: 90% 90%    Last Pain:  Vitals:   05/24/17 1000  TempSrc:   PainSc: 0-No pain                 Lynda Rainwater

## 2017-05-24 NOTE — Op Note (Signed)
DATE OF PROCEDURE:  05/24/2017  OPERATIVE REPORT  PREOPERATIVE DIAGNOSIS:  Soft tissue mass, posterior neck.  POSTOPERATIVE DIAGNOSIS:  Soft tissue mass, posterior neck.  PROCEDURE:  Excision soft tissue mass, posterior neck, subcutaneous, 5.5 x 5 x 3 cm.  SURGEON:  Earnstine Regal, MD.  ANESTHESIA:  General.  ESTIMATED BLOOD LOSS:  Minimal.  PREPARATION:  ChloraPrep.  COMPLICATIONS:  None.  INDICATIONS:  The patient is a 67 year old male with a slowly enlarging mass in the posterior neck, which is becoming mildly symptomatic.  The patient now comes to surgery for excision.  DESCRIPTION OF PROCEDURE:  Procedure is done in OR #8 at the [facility].  The patient is brought to the operating room and placed in a supine position on the stretcher.  Following the induction of general anesthesia, the patient is  turned to a prone position, properly positioned, and then prepped and draped in the usual aseptic fashion.  After ascertaining that an adequate level of anesthesia had been maintained, the skin overlying the mass is anesthetized with local anesthetic.  A  4 cm incision is made with a #15 blade and carried through the skin and into the subcutaneous tissues.  Hemostasis is achieved with the electrocautery.  Skin flaps are elevated circumferentially using the electrocautery for hemostasis.  The entire mass  is mobilized from the subcutaneous tissues. The mass is quite firm.  It does appear to be discrete.  There is normal adipose tissue surrounding the mass.  Dissection is carried down to the ligamentous structures.  The mass is somewhat fixed to the  underlying ligaments and a portion of the underlying fascia is excised with the mass.  The entire mass is excised.  It measures 5.5 x 5.0 x 3.0 cm and is submitted in its entirety to pathology for review.  Wound is irrigated with warm saline, which is evacuated.  Good hemostasis is noted throughout the operative field.   Subcutaneous tissues are closed with interrupted 3-0 Vicryl sutures.  Skin is closed with a running 4-0 Monocryl subcuticular suture.   Wound is washed and dried and Steri-Strips are applied.  Sterile dressings are applied.  The patient is awakened from anesthesia and brought to the recovery room.  The patient tolerated the procedure well.  Armandina Gemma, [facility] Office: [phone]    PN/NUANCE  D:05/24/2017 T:05/24/2017 JOB:[id number]

## 2017-05-24 NOTE — Brief Op Note (Signed)
05/24/2017  8:32 AM  PATIENT:  Matthew Simon  67 y.o. male  PRE-OPERATIVE DIAGNOSIS:  posterior neck mass  POST-OPERATIVE DIAGNOSIS:  posterior neck mass  PROCEDURE:  Procedure(s): EXCISION POSTERIOR NECK MASS (N/A)  SURGEON:  Surgeon(s) and Role:    * Armandina Gemma, MD - Primary  ANESTHESIA:   general  EBL:  minimal   BLOOD ADMINISTERED:none  DRAINS: none   LOCAL MEDICATIONS USED:  MARCAINE     SPECIMEN:  Excision  DISPOSITION OF SPECIMEN:  PATHOLOGY  COUNTS:  YES  TOURNIQUET:  * No tourniquets in log *  DICTATION: .Other Dictation: Dictation Number [id number]  PLAN OF CARE: Discharge to home after PACU  PATIENT DISPOSITION:  PACU - hemodynamically stable.   Delay start of Pharmacological VTE agent (>24hrs) due to surgical blood loss or risk of bleeding: yes  Armandina Gemma, MD [facility] Office: [phone]

## 2017-05-24 NOTE — Interval H&P Note (Signed)
History and Physical Interval Note:  05/24/2017 7:04 AM  Matthew Simon  has presented today for surgery, with the diagnosis of posterior neck mass  The various methods of treatment have been discussed with the patient and family. After consideration of risks, benefits and other options for treatment, the patient has consented to  Procedure(s): EXCISION POSTERIOR NECK MASS (N/A) as a surgical intervention .  The patient's history has been reviewed, patient examined, no change in status, stable for surgery.  I have reviewed the patient's chart and labs.  Questions were answered to the patient's satisfaction.     Dontrail Blackwell Jerilynn Mages

## 2017-05-24 NOTE — Anesthesia Procedure Notes (Signed)
Procedure Name: Intubation Date/Time: 05/24/2017 7:31 AM Performed by: Orlie Dakin, CRNA Pre-anesthesia Checklist: Patient identified, Emergency Drugs available, Suction available, Patient being monitored and Timeout performed Patient Re-evaluated:Patient Re-evaluated prior to induction Oxygen Delivery Method: Circle system utilized Preoxygenation: Pre-oxygenation with 100% oxygen Induction Type: IV induction Ventilation: Mask ventilation without difficulty Laryngoscope Size: Miller and 3 Grade View: Grade I Tube type: Oral Tube size: 7.5 mm Number of attempts: 1 Airway Equipment and Method: Stylet Placement Confirmation: positive ETCO2,  breath sounds checked- equal and bilateral and ETT inserted through vocal cords under direct vision Secured at: 23 cm Tube secured with: Tape Dental Injury: Teeth and Oropharynx as per pre-operative assessment  Comments: 4x4s bite block used.

## 2017-05-24 NOTE — Addendum Note (Signed)
Addendum  created 05/24/17 1046 by Orlie Dakin, CRNA   Intraprocedure Flowsheets edited

## 2017-05-24 NOTE — Progress Notes (Signed)
Received from OR, patient sedated, 02 sats at 60's, chin lift done by CRNA, bagged by another RN. Dr. Sabra Heck and Dr. Harlow Asa at bedside. 02 sats slowly up to 93%, converted to simple mask, 02 sats at 92%. Patient more awake and arousable.Matthew Simon

## 2017-05-25 ENCOUNTER — Encounter (HOSPITAL_COMMUNITY): Payer: Self-pay | Admitting: Surgery

## 2017-05-26 ENCOUNTER — Encounter: Payer: Self-pay | Admitting: Gastroenterology

## 2017-11-26 ENCOUNTER — Other Ambulatory Visit: Payer: Self-pay | Admitting: Family Medicine

## 2017-11-26 DIAGNOSIS — I10 Essential (primary) hypertension: Secondary | ICD-10-CM

## 2018-02-05 ENCOUNTER — Other Ambulatory Visit: Payer: Self-pay | Admitting: Family Medicine

## 2018-02-05 DIAGNOSIS — I10 Essential (primary) hypertension: Secondary | ICD-10-CM

## 2018-02-07 NOTE — Telephone Encounter (Signed)
Called pt and left message to call back to make an appointment

## 2018-02-21 ENCOUNTER — Other Ambulatory Visit: Payer: Self-pay | Admitting: Family Medicine

## 2018-02-21 DIAGNOSIS — I10 Essential (primary) hypertension: Secondary | ICD-10-CM

## 2018-02-21 MED ORDER — LISINOPRIL 20 MG PO TABS: 20.0000 mg | ORAL_TABLET | Freq: Every day | ORAL | 0 refills | Status: DC

## 2018-02-21 NOTE — Telephone Encounter (Signed)
Pharmacy is requesting changes to Rx- sent for provider review 

## 2018-02-21 NOTE — Telephone Encounter (Signed)
Pt called and notified that physical was needed in order to continue to receive refills. Pt scheduled for 03/21/18.

## 2018-02-21 NOTE — Telephone Encounter (Signed)
Copied from [place] [id number]. Topic: Quick Communication - Rx Refill/Question >> Feb 21, 2018  3:32 PM Windy Kalata wrote: Medication: lisinopril (PRINIVIL,ZESTRIL) 20 MG tablet [[id number]]   Has the patient contacted their pharmacy? No. (Agent: If no, request that the patient contact the pharmacy for the refill.) (Agent: If yes, when and what did the pharmacy advise?)  Preferred Pharmacy (with phone number or street name): [id number] - [place], [place] [phone] (Phone) [phone] (Fax)  Patient is out of his medication  Agent: Please be advised that RX refills may take up to 3 business days. We ask that you follow-up with your pharmacy.

## 2018-02-21 NOTE — Telephone Encounter (Signed)
30 day refill given until appt on 03/21/18.

## 2018-03-05 ENCOUNTER — Other Ambulatory Visit: Payer: Self-pay | Admitting: Family Medicine

## 2018-03-05 DIAGNOSIS — I10 Essential (primary) hypertension: Secondary | ICD-10-CM

## 2018-03-05 DIAGNOSIS — M109 Gout, unspecified: Secondary | ICD-10-CM

## 2018-03-07 NOTE — Telephone Encounter (Signed)
Requested medication (s) are due for refill today: yes  Requested medication (s) are on the active medication list: yes  Last refill:  03/02/17 expired RX  Future visit scheduled: yes  Notes to clinic:  Expired RX    Requested Prescriptions  Pending Prescriptions Disp Refills   allopurinol (ZYLOPRIM) 100 MG tablet [Pharmacy Med Name: ALLOPURINOL 100 MG TABLET] 90 tablet 2    Sig: TAKE [place]     Endocrinology:  Gout Agents Failed - 03/05/2018 12:10 AM      Failed - Uric Acid in normal range and within 360 days    Uric Acid  Date Value Ref Range Status  03/02/2017 7.0 3.7 - 8.6 mg/dL Final    Comment:               Therapeutic target for gout patients: <6.0         Failed - Valid encounter within last 12 months    Recent Outpatient Visits          1 year ago Essential hypertension   [facility] at [facility], Ranell Patrick, MD   1 year ago Abscess of left groin   Primary Care at [facility], [place], [place]   1 year ago Abscess of left groin   [facility] at [facility], Ranell Patrick, MD   1 year ago Annual physical exam   [facility] at [facility], Ranell Patrick, MD   1 year ago Annual physical exam   Primary Care at [place], [place]      Future Appointments            In 2 weeks Wendie Agreste, MD Primary Care at [place], [place] in normal range and within 360 days    Creat  Date Value Ref Range Status  02/21/2015 1.28 (H) 0.70 - 1.25 mg/dL Final   Creatinine, Ser  Date Value Ref Range Status  05/18/2017 1.22 0.61 - 1.24 mg/dL Final        amLODipine (NORVASC) 5 MG tablet [Pharmacy Med Name: AMLODIPINE BESYLATE 5 MG TAB] 90 tablet 2    Sig: TAKE 1 TABLET BY MOUTH EVERY DAY     Cardiovascular:  Calcium Channel Blockers Failed - 03/05/2018 12:10 AM      Failed - Last BP in normal range    BP Readings from Last 1 Encounters:  05/24/17 (!) 145/94         Failed - Valid encounter within last 6 months    Recent  Outpatient Visits          1 year ago Essential hypertension   [facility] at [facility], Ranell Patrick, MD   1 year ago Abscess of left groin   Primary Care at [facility], [place], [place]   1 year ago Abscess of left groin   [facility] at [facility], Ranell Patrick, MD   1 year ago Annual physical exam   [facility] at [facility], Ranell Patrick, MD   1 year ago Annual physical exam   Primary Care at [place], [place]      Future Appointments            In 2 weeks Carlota Raspberry, Ranell Patrick, MD Primary Care at [place], [facility]

## 2018-03-10 NOTE — Telephone Encounter (Signed)
Called pt, lm. Pt need ov for up to date lab work. Pend 30 days supply of medication for the patient

## 2018-03-18 ENCOUNTER — Other Ambulatory Visit: Payer: Self-pay | Admitting: Family Medicine

## 2018-03-18 DIAGNOSIS — I10 Essential (primary) hypertension: Secondary | ICD-10-CM

## 2018-03-18 NOTE — Telephone Encounter (Signed)
Patient has appointment in 3 days- extension of Rx-2 weeks Requested Prescriptions  Pending Prescriptions Disp Refills  . lisinopril (PRINIVIL,ZESTRIL) 20 MG tablet [Pharmacy Med Name: LISINOPRIL 20 MG TABLET] 15 tablet 0    Sig: TAKE 1 TABLET BY MOUTH EVERY DAY     Cardiovascular:  ACE Inhibitors Failed - 03/18/2018  1:33 PM      Failed - Cr in normal range and within 180 days    Creat  Date Value Ref Range Status  02/21/2015 1.28 (H) 0.70 - 1.25 mg/dL Final   Creatinine, Ser  Date Value Ref Range Status  05/18/2017 1.22 0.61 - 1.24 mg/dL Final         Failed - K in normal range and within 180 days    Potassium  Date Value Ref Range Status  05/18/2017 3.7 3.5 - 5.1 mmol/L Final         Failed - Last BP in normal range    BP Readings from Last 1 Encounters:  05/24/17 (!) 145/94         Failed - Valid encounter within last 6 months    Recent Outpatient Visits          1 year ago Essential hypertension   [facility] at [facility], Ranell Patrick, MD   1 year ago Abscess of left groin   Primary Care at [facility], [place], [place]   1 year ago Abscess of left groin   [facility] at [facility], Ranell Patrick, MD   1 year ago Annual physical exam   [facility] at [facility], Ranell Patrick, MD   1 year ago Annual physical exam   Primary Care at [place], [place]      Future Appointments            In 3 days Carlota Raspberry Ranell Patrick, MD Primary Care at [place], [place] - Patient is not pregnant

## 2018-03-21 ENCOUNTER — Ambulatory Visit (INDEPENDENT_AMBULATORY_CARE_PROVIDER_SITE_OTHER): Payer: Medicare HMO | Admitting: Family Medicine

## 2018-03-21 ENCOUNTER — Encounter: Payer: Self-pay | Admitting: Family Medicine

## 2018-03-21 ENCOUNTER — Other Ambulatory Visit: Payer: Self-pay

## 2018-03-21 VITALS — BP 138/80 | HR 75 | Temp 98.5°F | Resp 12 | Ht 71.0 in | Wt 190.2 lb

## 2018-03-21 DIAGNOSIS — Z131 Encounter for screening for diabetes mellitus: Secondary | ICD-10-CM

## 2018-03-21 DIAGNOSIS — M2042 Other hammer toe(s) (acquired), left foot: Secondary | ICD-10-CM | POA: Diagnosis not present

## 2018-03-21 DIAGNOSIS — Z13220 Encounter for screening for lipoid disorders: Secondary | ICD-10-CM | POA: Diagnosis not present

## 2018-03-21 DIAGNOSIS — R200 Anesthesia of skin: Secondary | ICD-10-CM

## 2018-03-21 DIAGNOSIS — I10 Essential (primary) hypertension: Secondary | ICD-10-CM | POA: Diagnosis not present

## 2018-03-21 DIAGNOSIS — Z23 Encounter for immunization: Secondary | ICD-10-CM

## 2018-03-21 DIAGNOSIS — R3911 Hesitancy of micturition: Secondary | ICD-10-CM | POA: Diagnosis not present

## 2018-03-21 DIAGNOSIS — Z0001 Encounter for general adult medical examination with abnormal findings: Secondary | ICD-10-CM

## 2018-03-21 DIAGNOSIS — M109 Gout, unspecified: Secondary | ICD-10-CM | POA: Diagnosis not present

## 2018-03-21 DIAGNOSIS — Z125 Encounter for screening for malignant neoplasm of prostate: Secondary | ICD-10-CM

## 2018-03-21 DIAGNOSIS — Z0000 Encounter for general adult medical examination without abnormal findings: Secondary | ICD-10-CM

## 2018-03-21 LAB — POCT URINALYSIS DIP (MANUAL ENTRY)
BILIRUBIN UA: NEGATIVE
BILIRUBIN UA: NEGATIVE mg/dL
Glucose, UA: NEGATIVE mg/dL
Leukocytes, UA: NEGATIVE
Nitrite, UA: NEGATIVE
PH UA: 5.5 (ref 5.0–8.0)
Protein Ur, POC: 30 mg/dL — AB
RBC UA: NEGATIVE
SPEC GRAV UA: 1.02 (ref 1.010–1.025)
Urobilinogen, UA: 0.2 E.U./dL

## 2018-03-21 LAB — POC MICROSCOPIC URINALYSIS (UMFC): Mucus: ABSENT

## 2018-03-21 MED ORDER — ZOSTER VAC RECOMB ADJUVANTED 50 MCG/0.5ML IM SUSR: 0.5000 mL | Freq: Once | INTRAMUSCULAR | 1 refills | Status: AC

## 2018-03-21 NOTE — Patient Instructions (Addendum)
I will refer you to podiatry as I think the numbness in the second toe may be related to the hammertoe.  They can discuss different treatment options with you.  Pain at the front of the right ankle appears to be a possible tendinitis.  That often can happen from change in activities or certain types of exercises.  You can try backing off kickboxing or other activities that may cause pain in the front of that ankle for the next couple weeks but discuss this with podiatry as well.  I am also happy to see you in follow up if needed as well.   I will check a urine test and prostate test for the urinary symptoms.  If any acute worsening, return for recheck.  Depending on that reading we could consider a low-dose of a medication for possible enlarged prostate.  Thank you for coming in today.  Keeping you healthy  Get these tests  Blood pressure- Have your blood pressure checked once a year by your healthcare provider.  Normal blood pressure is 120/80  Weight- Have your body mass index (BMI) calculated to screen for obesity.  BMI is a measure of body fat based on height and weight. You can also calculate your own BMI at ViewBanking.si.  Cholesterol- Have your cholesterol checked every year.  Diabetes- Have your blood sugar checked regularly if you have high blood pressure, high cholesterol, have a family history of diabetes or if you are overweight.  Screening for Colon Cancer- Colonoscopy starting at age 33.  Screening may begin sooner depending on your family history and other health conditions. Follow up colonoscopy as directed by your Gastroenterologist.  Screening for Prostate Cancer- Both blood work (PSA) and a rectal exam help screen for Prostate Cancer.  Screening begins at age 46 with African-American men and at age 106 with Caucasian men.  Screening may begin sooner depending on your family history.  Take these medicines  Aspirin- One aspirin daily can help prevent Heart  disease and Stroke.  Flu shot- Every fall.  Tetanus- Every 10 years.  Zostavax- Once after the age of 84 to prevent Shingles.  Pneumonia shot- Once after the age of 64; if you are younger than 50, ask your healthcare provider if you need a Pneumonia shot.  Take these steps  Don't smoke- If you do smoke, talk to your doctor about quitting.  For tips on how to quit, go to www.smokefree.gov or call 1-800-QUIT-NOW.  Be physically active- Exercise 5 days a week for at least 30 minutes.  If you are not already physically active start slow and gradually work up to 30 minutes of moderate physical activity.  Examples of moderate activity include walking briskly, mowing the yard, dancing, swimming, bicycling, etc.  Eat a healthy diet- Eat a variety of healthy food such as fruits, vegetables, low fat milk, low fat cheese, yogurt, lean meant, poultry, fish, beans, tofu, etc. For more information go to www.thenutritionsource.org  Drink alcohol in moderation- Limit alcohol intake to less than two drinks a day. Never drink and drive.  Dentist- Brush and floss twice daily; visit your dentist twice a year.  Depression- Your emotional health is as important as your physical health. If you're feeling down, or losing interest in things you would normally enjoy please talk to your healthcare provider.  Eye exam- Visit your eye doctor every year.  Safe sex- If you may be exposed to a sexually transmitted infection, use a condom.  Seat belts- Seat belts can  save your life; always wear one.  Smoke/Carbon Monoxide detectors- These detectors need to be installed on the appropriate level of your home.  Replace batteries at least once a year.  Skin cancer- When out in the sun, cover up and use sunscreen 15 SPF or higher.  Violence- If anyone is threatening you, please tell your healthcare provider.  Living Will/ Health care power of attorney- Speak with your healthcare provider and family.    If you have  lab work done today you will be contacted with your lab results within the next 2 weeks.  If you have not heard from [place] then please contact us. The fastest way to get your results is to register for My Chart.   IF you received an x-ray today, you will receive an invoice from [facility]. Please contact [facility] at [phone] with questions or concerns regarding your invoice.   IF you received labwork today, you will receive an invoice from [facility]. Please contact [facility] at [phone] with questions or concerns regarding your invoice.   Our billing staff will not be able to assist you with questions regarding bills from these companies.  You will be contacted with the lab results as soon as they are available. The fastest way to get your results is to activate your My Chart account. Instructions are located on the last page of this paperwork. If you have not heard from [place] regarding the results in 2 weeks, please contact this office.

## 2018-03-21 NOTE — Progress Notes (Signed)
Subjective:    Patient ID: [name], male    DOB: Nov 22, 1950, 68 y.o.   MRN: [id number]  HPI JORIAN WILLHOITE is a 68 y.o. male Presents today for: Chief Complaint  Patient presents with  . Hypertension    patient last seen here on 03/02/17    Hypertension: BP Readings from Last 3 Encounters:  03/21/18 138/80  05/24/17 (!) 145/94  05/18/17 (!) 161/83   Lab Results  Component Value Date   CREATININE 1.22 05/18/2017  home readings similar - 130's/80's.  Still on lisinopril, norvasc qd. No missed doses.   Gout: On allopurinol 100mg  QD.  No new side effects.  Occasional pain in front of R ankle. No redness/swelling. Uric acid 7.0 in Jan 2019.   Occasional difficulty with urination - comes and goes. Past month. No hematuria. No fever/abd pain. No dysuria. Last PSA 1.5 in April 2018.   Some numbness in second toe  Notes in morning.  Improves during during day. Some shoes cause more symptoms. hammartoe on left foot - no podiatrist. No change in physical activity. Pain in front of R ankle at times.   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   Cancer screening: Colonoscopy - 05/12/17.  Prostate - agrees to test today, sx's as above.   Immunization History  Administered Date(s) Administered  . Influenza, Seasonal, Injecte, Preservative Fre 02/12/2012  . Influenza,inj,Quad PF,6+ Mos 12/24/2014  . Pneumococcal Conjugate-13 02/21/2015  . Pneumococcal Polysaccharide-23 02/26/2010, 05/05/2016  has not had shingles vaccine. Agrees to rx.  Declines flu vaccine.    Depression screen [facility] 2/9 03/21/2018 05/25/2016 05/23/2016 05/05/2016 05/04/2016  Decreased Interest 0 0 0 - 0  Down, Depressed, Hopeless 0 0 0 0 0  PHQ - 2 Score 0 0 0 0 0    Visual Acuity Screening   Right eye Left eye Both eyes  Without correction: 20/15 20/13 20/13   With correction:     has appt with optho soon.   Dentist: October 2019. Every 6 months.   Exercise: kickboxing, body step, walking -  no new exercises.    Patient Active Problem List   Diagnosis Date Noted  . Soft tissue mass 05/22/2017  . Gout 05/05/2016  . Hypertension 04/10/2011  . Colon polyp 04/10/2011   Past Medical History:  Diagnosis Date  . Gout   . Hypertension    Past Surgical History:  Procedure Laterality Date  . COLONOSCOPY    . EXCISION MASS NECK N/A 05/24/2017   Procedure: EXCISION POSTERIOR NECK MASS;  Surgeon: Armandina Gemma, MD;  Location: [place];  Service: General;  Laterality: N/A;  . WISDOM TOOTH EXTRACTION     No Known Allergies Prior to Admission medications   Medication Sig Start Date End Date Taking? Authorizing Provider  allopurinol (ZYLOPRIM) 100 MG tablet TAKE 1 TABLET BY MOUTH EVERY DAY 03/11/18  Yes Wendie Agreste, MD  amLODipine (NORVASC) 5 MG tablet TAKE 1 TABLET BY MOUTH EVERY DAY 03/11/18  Yes Wendie Agreste, MD  lisinopril (PRINIVIL,ZESTRIL) 20 MG tablet TAKE 1 TABLET BY MOUTH EVERY DAY 03/18/18  Yes Wendie Agreste, MD   Social History   Socioeconomic History  . Marital status: Married    Spouse name: Not on file  . Number of children: Not on file  . Years of education: Not on file  . Highest education level: Not on file  Occupational History  . Occupation: business development  Social Needs  . Financial resource strain: Not  on file  . Food insecurity:    Worry: Not on file    Inability: Not on file  . Transportation needs:    Medical: Not on file    Non-medical: Not on file  Tobacco Use  . Smoking status: Former Smoker    Last attempt to quit: 05/10/1964    Years since quitting: 53.8  . Smokeless tobacco: Never Used  Substance and Sexual Activity  . Alcohol use: Yes    Alcohol/week: 21.0 standard drinks    Types: 21 Standard drinks or equivalent per week    Comment: wine with dinner  . Drug use: No  . Sexual activity: Not on file  Lifestyle  . Physical activity:    Days per week: Not on file    Minutes per session: Not on file  . Stress: Not on file    Relationships  . Social connections:    Talks on phone: Not on file    Gets together: Not on file    Attends religious service: Not on file    Active member of club or organization: Not on file    Attends meetings of clubs or organizations: Not on file    Relationship status: Not on file  . Intimate partner violence:    Fear of current or ex partner: Not on file    Emotionally abused: Not on file    Physically abused: Not on file    Forced sexual activity: Not on file  Other Topics Concern  . Not on file  Social History Narrative  . Not on file    Review of Systems Per HPI. Otherwise negative.     Objective:   Physical Exam Vitals signs reviewed.  Constitutional:      Appearance: He is well-developed.  HENT:     Head: Normocephalic and atraumatic.     Right Ear: External ear normal.     Left Ear: External ear normal.  Eyes:     Conjunctiva/sclera: Conjunctivae normal.     Pupils: Pupils are equal, round, and reactive to light.  Neck:     Musculoskeletal: Normal range of motion and neck supple.     Thyroid: No thyromegaly.  Cardiovascular:     Rate and Rhythm: Normal rate and regular rhythm.     Heart sounds: Normal heart sounds.  Pulmonary:     Effort: Pulmonary effort is normal. No respiratory distress.     Breath sounds: Normal breath sounds. No wheezing.  Abdominal:     General: There is no distension.     Palpations: Abdomen is soft.     Tenderness: There is no abdominal tenderness.     Hernia: There is no hernia in the right inguinal area or left inguinal area.  Genitourinary:    Prostate: Normal. Not tender and no nodules present.  Musculoskeletal: Normal range of motion.        General: No tenderness.  Lymphadenopathy:     Cervical: No cervical adenopathy.  Skin:    General: Skin is warm and dry.  Neurological:     Mental Status: He is alert and oriented to person, place, and time.     Deep Tendon Reflexes: Reflexes are normal and symmetric.   Psychiatric:        Behavior: Behavior normal.    Vitals:   03/21/18 1524  BP: 138/80  Pulse: 75  Resp: 12  Temp: 98.5 F (36.9 C)  TempSrc: Oral  SpO2: 96%  Weight: 190 lb 3.2 oz (86.3 kg)  Height: 5\' 11"  (1.803 m)      Assessment & Plan:   KRISTOFF COONRADT is a 68 y.o. male Annual physical exam  - -anticipatory guidance as below in AVS, screening labs above. Health maintenance items as above in HPI discussed/recommended as applicable. \  Screening for hyperlipidemia - Plan: Lipid Panel, Comprehensive metabolic panel  Screening for diabetes mellitus - Plan: Comprehensive metabolic panel  Screening for prostate cancer - Plan: PSA  - We discussed pros and cons of prostate cancer screening, and after this discussion, he chose to have screening done. PSA obtained, and no concerning findings on DRE.   Urinary hesitancy - Plan: POCT urinalysis dipstick, POCT Microscopic Urinalysis ([facility]), PSA  -Intermittent symptoms.  Prostate nontender, will check PSA.  Possible BPH.  In office urinalysis reassuring.  Hammer toe of left foot - Plan: Ambulatory referral to Podiatry Numbness of toes - Plan: Ambulatory referral to Podiatry  -Numbness appears to be at area of hammertoe.  Refer to podiatry for further evaluation.  -Anterior ankle pain appears to be more likely tendinitis.  Could be related to his exercise/kickboxing.  Recommended activity modification for a few weeks to see if that allows that area to calm down.  RTC precautions if persistent.  Need for shingles vaccine - Plan: Zoster Vaccine Adjuvanted [facility]) injection  Gout, unspecified cause, unspecified chronicity, unspecified site - Plan: Uric Acid  -Check uric acid, continue allopurinol same dose.  Meds ordered this encounter  Medications  . Zoster Vaccine Adjuvanted [facility]) injection    Sig: Inject 0.5 mLs into the muscle once for 1 dose. Repeat in 2-6 months.    Dispense:  0.5 mL    Refill:  1   Patient  Instructions    I will refer you to podiatry as I think the numbness in the second toe may be related to the hammertoe.  They can discuss different treatment options with you.  Pain at the front of the right ankle appears to be a possible tendinitis.  That often can happen from change in activities or certain types of exercises.  You can try backing off kickboxing or other activities that may cause pain in the front of that ankle for the next couple weeks but discuss this with podiatry as well.  I am also happy to see you in follow up if needed as well.   I will check a urine test and prostate test for the urinary symptoms.  If any acute worsening, return for recheck.  Depending on that reading we could consider a low-dose of a medication for possible enlarged prostate.  Thank you for coming in today.  Keeping you healthy  Get these tests  Blood pressure- Have your blood pressure checked once a year by your healthcare provider.  Normal blood pressure is 120/80  Weight- Have your body mass index (BMI) calculated to screen for obesity.  BMI is a measure of body fat based on height and weight. You can also calculate your own BMI at [employer].si.  Cholesterol- Have your cholesterol checked every year.  Diabetes- Have your blood sugar checked regularly if you have high blood pressure, high cholesterol, have a family history of diabetes or if you are overweight.  Screening for Colon Cancer- Colonoscopy starting at age 68.  Screening may begin sooner depending on your family history and other health conditions. Follow up colonoscopy as directed by your Gastroenterologist.  Screening for Prostate Cancer- Both blood work (PSA) and a rectal exam help screen for Prostate  Cancer.  Screening begins at age 96 with African-American men and at age 27 with Caucasian men.  Screening may begin sooner depending on your family history.  Take these medicines  Aspirin- One aspirin daily can help  prevent Heart disease and Stroke.  Flu shot- Every fall.  Tetanus- Every 10 years.  Zostavax- Once after the age of 61 to prevent Shingles.  Pneumonia shot- Once after the age of 59; if you are younger than 45, ask your healthcare provider if you need a Pneumonia shot.  Take these steps  Don't smoke- If you do smoke, talk to your doctor about quitting.  For tips on how to quit, go to www.smokefree.gov or call 1-800-QUIT-NOW.  Be physically active- Exercise 5 days a week for at least 30 minutes.  If you are not already physically active start slow and gradually work up to 30 minutes of moderate physical activity.  Examples of moderate activity include walking briskly, mowing the yard, dancing, swimming, bicycling, etc.  Eat a healthy diet- Eat a variety of healthy food such as fruits, vegetables, low fat milk, low fat cheese, yogurt, lean meant, poultry, fish, beans, tofu, etc. For more information go to www.thenutritionsource.org  Drink alcohol in moderation- Limit alcohol intake to less than two drinks a day. Never drink and drive.  Dentist- Brush and floss twice daily; visit your dentist twice a year.  Depression- Your emotional health is as important as your physical health. If you're feeling down, or losing interest in things you would normally enjoy please talk to your healthcare provider.  Eye exam- Visit your eye doctor every year.  Safe sex- If you may be exposed to a sexually transmitted infection, use a condom.  Seat belts- Seat belts can save your life; always wear one.  Smoke/Carbon Monoxide detectors- These detectors need to be installed on the appropriate level of your home.  Replace batteries at least once a year.  Skin cancer- When out in the sun, cover up and use sunscreen 15 SPF or higher.  Violence- If anyone is threatening you, please tell your healthcare provider.  Living Will/ Health care power of attorney- Speak with your healthcare provider and  family.    If you have lab work done today you will be contacted with your lab results within the next 2 weeks.  If you have not heard from [place] then please contact us. The fastest way to get your results is to register for My Chart.   IF you received an x-ray today, you will receive an invoice from [facility]. Please contact [facility] at [phone] with questions or concerns regarding your invoice.   IF you received labwork today, you will receive an invoice from [facility]. Please contact [facility] at [phone] with questions or concerns regarding your invoice.   Our billing staff will not be able to assist you with questions regarding bills from these companies.  You will be contacted with the lab results as soon as they are available. The fastest way to get your results is to activate your My Chart account. Instructions are located on the last page of this paperwork. If you have not heard from [place] regarding the results in 2 weeks, please contact this office.       Signed,   Merri Ray, MD [facility].  03/23/18 10:29 PM

## 2018-03-22 LAB — COMPREHENSIVE METABOLIC PANEL
A/G RATIO: 2 (ref 1.2–2.2)
ALK PHOS: 51 IU/L (ref 39–117)
ALT: 13 IU/L (ref 0–44)
AST: 21 IU/L (ref 0–40)
Albumin: 4.5 g/dL (ref 3.8–4.8)
BILIRUBIN TOTAL: 0.5 mg/dL (ref 0.0–1.2)
BUN/Creatinine Ratio: 10 (ref 10–24)
BUN: 13 mg/dL (ref 8–27)
CHLORIDE: 106 mmol/L (ref 96–106)
CO2: 24 mmol/L (ref 20–29)
Calcium: 9.7 mg/dL (ref 8.6–10.2)
Creatinine, Ser: 1.3 mg/dL — ABNORMAL HIGH (ref 0.76–1.27)
GFR calc Af Amer: 65 mL/min/{1.73_m2} (ref >59)
GFR, EST NON AFRICAN AMERICAN: 56 mL/min/{1.73_m2} — AB (ref >59)
GLOBULIN, TOTAL: 2.3 g/dL (ref 1.5–4.5)
Glucose: 83 mg/dL (ref 65–99)
POTASSIUM: 3.9 mmol/L (ref 3.5–5.2)
SODIUM: 141 mmol/L (ref 134–144)
Total Protein: 6.8 g/dL (ref 6.0–8.5)

## 2018-03-22 LAB — LIPID PANEL
CHOL/HDL RATIO: 2.5 ratio (ref 0.0–5.0)
CHOLESTEROL TOTAL: 204 mg/dL — AB (ref 100–199)
HDL: 82 mg/dL (ref >39)
LDL CALC: 97 mg/dL (ref 0–99)
TRIGLYCERIDES: 125 mg/dL (ref 0–149)
VLDL CHOLESTEROL CAL: 25 mg/dL (ref 5–40)

## 2018-03-22 LAB — URIC ACID: URIC ACID: 5.1 mg/dL (ref 3.7–8.6)

## 2018-03-22 LAB — PSA: Prostate Specific Ag, Serum: 1.3 ng/mL (ref 0.0–4.0)

## 2018-03-23 MED ORDER — ALLOPURINOL 100 MG PO TABS: 100.0000 mg | ORAL_TABLET | Freq: Every day | ORAL | 1 refills | Status: DC

## 2018-03-23 MED ORDER — AMLODIPINE BESYLATE 5 MG PO TABS: 5.0000 mg | ORAL_TABLET | Freq: Every day | ORAL | 1 refills | Status: DC

## 2018-03-23 MED ORDER — LISINOPRIL 20 MG PO TABS: 20.0000 mg | ORAL_TABLET | Freq: Every day | ORAL | 1 refills | Status: DC

## 2018-03-29 ENCOUNTER — Ambulatory Visit: Payer: Medicare HMO | Admitting: Sports Medicine

## 2018-03-29 ENCOUNTER — Ambulatory Visit (INDEPENDENT_AMBULATORY_CARE_PROVIDER_SITE_OTHER): Payer: Medicare HMO

## 2018-03-29 ENCOUNTER — Encounter: Payer: Self-pay | Admitting: Sports Medicine

## 2018-03-29 VITALS — BP 147/81 | HR 67 | Resp 16

## 2018-03-29 DIAGNOSIS — M2141 Flat foot [pes planus] (acquired), right foot: Secondary | ICD-10-CM

## 2018-03-29 DIAGNOSIS — M21619 Bunion of unspecified foot: Secondary | ICD-10-CM | POA: Diagnosis not present

## 2018-03-29 DIAGNOSIS — M2042 Other hammer toe(s) (acquired), left foot: Secondary | ICD-10-CM | POA: Diagnosis not present

## 2018-03-29 DIAGNOSIS — M25571 Pain in right ankle and joints of right foot: Secondary | ICD-10-CM | POA: Diagnosis not present

## 2018-03-29 DIAGNOSIS — M7751 Other enthesopathy of right foot: Secondary | ICD-10-CM | POA: Diagnosis not present

## 2018-03-29 DIAGNOSIS — M2142 Flat foot [pes planus] (acquired), left foot: Secondary | ICD-10-CM

## 2018-03-29 DIAGNOSIS — M79672 Pain in left foot: Secondary | ICD-10-CM | POA: Diagnosis not present

## 2018-03-29 DIAGNOSIS — M7750 Other enthesopathy of unspecified foot: Secondary | ICD-10-CM

## 2018-03-29 DIAGNOSIS — M1990 Unspecified osteoarthritis, unspecified site: Secondary | ICD-10-CM

## 2018-03-29 NOTE — Progress Notes (Signed)
Subjective: Matthew Simon is a 68 y.o. male patient who presents to office for evaluation of Right ankle and Left foot pain. Patient complains of progressive pain especially over the last year in the Left foot at the 2 toe and the last 2 months at right ankle that wakes him up at night feels like pinching sensation to ankle. Does kickboxing but does not remember injury. Patient has tried gout meds and avoiding certain shoes with no relief in symptoms. Patient denies any other pedal complaints.   Review of Systems  Musculoskeletal: Positive for joint pain.  All other systems reviewed and are negative.    Patient Active Problem List   Diagnosis Date Noted  . Soft tissue mass 05/22/2017  . Gout 05/05/2016  . Hypertension 04/10/2011  . Colon polyp 04/10/2011    Current Outpatient Medications on File Prior to Visit  Medication Sig Dispense Refill  . allopurinol (ZYLOPRIM) 100 MG tablet Take 1 tablet (100 mg total) by mouth daily. 90 tablet 1  . amLODipine (NORVASC) 5 MG tablet Take 1 tablet (5 mg total) by mouth daily. 90 tablet 1  . lisinopril (PRINIVIL,ZESTRIL) 20 MG tablet Take 1 tablet (20 mg total) by mouth daily. 90 tablet 1   Current Facility-Administered Medications on File Prior to Visit  Medication Dose Route Frequency Provider Last Rate Last Dose  . 0.9 %  sodium chloride infusion  500 mL Intravenous Once Armbruster, Carlota Raspberry, MD        No Known Allergies  Objective:  General: Alert and oriented x3 in no acute distress  Dermatology: Small hyperkeratotic lesion overlying 2-5 PIPJ dorsally bilateral with the left 2nd toe most involved. No open lesions bilateral lower extremities, no webspace macerations, no ecchymosis bilateral, all nails x 10 are well manicured.  Vascular: Dorsalis Pedis and Posterior Tibial pedal pulses 2/4, Capillary Fill Time 3 seconds,(+) pedal hair growth bilateral, no edema bilateral lower extremities, Temperature gradient within normal  limits.  Neurology: Johney Maine sensation intact via light touch bilateral.  Musculoskeletal: Semi-flexible hammertoes 2-5 with Mild tenderness with palpation at PIPJ L>R with dorsal dislocation on left 2nd toe. Limited ankle ROM on right, Subtalar, Midtarsal, and MTPJ joint range of motion is within normal limits, there is no 1st ray hypermobility noted bilateral, Grade 4 bunion deformity noted bilateral. Pes planus foot type. No pain with calf compression bilateral.  Strength within normal limits in all groups bilateral.   Gait: Unassisted, Non-antalgic.  Xrays  Right/Left Foot    Impression: normal ossesous mineralization, There is midtarsal breech supportive of pes planus, there is significant bunion and hammertoe deformity dorsal dislocation at the second toes bilateral.  Mild soft tissue swelling.  No other acute findings.        Assessment and Plan: Problem List Items Addressed This Visit    None    Visit Diagnoses    Hammer toe of left foot    -  Primary   Relevant Orders   DG Foot Complete Left (Completed)   Tendonitis of ankle or foot       R   Relevant Orders   DG Foot Complete Right (Completed)   Arthritis       Bunion       Pes planus of both feet       Foot pain, left       Acute right ankle pain           -Complete examination performed -Xrays reviewed -Discussed treatement options for hammertoe secondary  to grade 4 bunion left greater than right and ankle pain likely arthritis on right -Rx over-the-counter Tylenol arthritis Ankle pain on right and for the left second toe dispensed toe cap  Advised patient to wear shoes that are comfortable -Patient to return to office in 2 months or sooner if condition worsens.  Advised patient if pain continues may benefit from a bunion hammertoe surgery on the left.  Landis Martins, DPM

## 2018-04-05 ENCOUNTER — Encounter: Payer: Self-pay | Admitting: Emergency Medicine

## 2018-05-31 ENCOUNTER — Other Ambulatory Visit: Payer: Self-pay

## 2018-05-31 ENCOUNTER — Ambulatory Visit: Payer: Medicare HMO | Admitting: Sports Medicine

## 2018-05-31 ENCOUNTER — Encounter: Payer: Self-pay | Admitting: Sports Medicine

## 2018-05-31 VITALS — Temp 98.1°F

## 2018-05-31 DIAGNOSIS — M7750 Other enthesopathy of unspecified foot: Secondary | ICD-10-CM

## 2018-05-31 DIAGNOSIS — M21619 Bunion of unspecified foot: Secondary | ICD-10-CM

## 2018-05-31 DIAGNOSIS — M2141 Flat foot [pes planus] (acquired), right foot: Secondary | ICD-10-CM | POA: Diagnosis not present

## 2018-05-31 DIAGNOSIS — M1990 Unspecified osteoarthritis, unspecified site: Secondary | ICD-10-CM | POA: Diagnosis not present

## 2018-05-31 DIAGNOSIS — M79672 Pain in left foot: Secondary | ICD-10-CM

## 2018-05-31 DIAGNOSIS — M2042 Other hammer toe(s) (acquired), left foot: Secondary | ICD-10-CM | POA: Diagnosis not present

## 2018-05-31 DIAGNOSIS — M25571 Pain in right ankle and joints of right foot: Secondary | ICD-10-CM | POA: Diagnosis not present

## 2018-05-31 DIAGNOSIS — M2142 Flat foot [pes planus] (acquired), left foot: Secondary | ICD-10-CM | POA: Diagnosis not present

## 2018-05-31 NOTE — Progress Notes (Signed)
Subjective: Matthew Simon is a 68 y.o. male patient who returns to office for follow up evaluation of Right ankle and Left foot pain. Patient reports that treatment recommended last visit has helped and at the moment does not have pain at his hammertoes or ankle on right.  Patient reports that the Tylenol arthritis and the toe cap is helping. Patient denies constitutional symptoms. Patient denies any other pedal complaints.     Patient Active Problem List   Diagnosis Date Noted  . Soft tissue mass 05/22/2017  . Gout 05/05/2016  . Hypertension 04/10/2011  . Colon polyp 04/10/2011    Current Outpatient Medications on File Prior to Visit  Medication Sig Dispense Refill  . allopurinol (ZYLOPRIM) 100 MG tablet Take 1 tablet (100 mg total) by mouth daily. 90 tablet 1  . amLODipine (NORVASC) 5 MG tablet Take 1 tablet (5 mg total) by mouth daily. 90 tablet 1  . lisinopril (PRINIVIL,ZESTRIL) 20 MG tablet Take 1 tablet (20 mg total) by mouth daily. 90 tablet 1   Current Facility-Administered Medications on File Prior to Visit  Medication Dose Route Frequency Provider Last Rate Last Dose  . 0.9 %  sodium chloride infusion  500 mL Intravenous Once Armbruster, Carlota Raspberry, MD        No Known Allergies  Objective:  General: Alert and oriented x3 in no acute distress  Dermatology: Small hyperkeratotic lesion overlying 2-5 PIPJ dorsally bilateral with the left 2nd toe most involved. No open lesions bilateral lower extremities, no webspace macerations, no ecchymosis bilateral, all nails x 10 are well manicured.  Vascular: Dorsalis Pedis and Posterior Tibial pedal pulses 2/4, Capillary Fill Time 3 seconds,(+) pedal hair growth bilateral, no edema bilateral lower extremities, Temperature gradient within normal limits.  Neurology: Johney Maine sensation intact via light touch bilateral.  Musculoskeletal: Semi-flexible hammertoes 2-5 with no reproduicble tenderness with palpation at PIPJ L>R with dorsal  dislocation on left 2nd toe. Limited ankle ROM on right without pain, Subtalar, Midtarsal, and MTPJ joint range of motion is within normal limits, there is no 1st ray hypermobility noted bilateral, Grade 4 bunion deformity noted bilateral. Pes planus foot type. No pain with calf compression bilateral.  Strength within normal limits in all groups bilateral.        Assessment and Plan: Problem List Items Addressed This Visit    None    Visit Diagnoses    Hammer toe of left foot    -  Primary   Bunion       Foot pain, left       Tendonitis of ankle or foot       R   Arthritis       R   Acute right ankle pain       Pes planus of both feet          -Complete examination performed -Previous Xrays reviewed -Re-Discussed treatement options for hammertoe secondary to grade 4 bunion left greater than right and ankle pain likely arthritis on right -Patient is currently asymptomatic -Advised patient to think about surgery if symptoms recur -Recommend continue with toe cap and tylenol arthritis; Re-dispensed a new toe cap at this visit.  -Return PRN or sooner if problems or issues arise.   Landis Martins, DPM

## 2018-09-21 ENCOUNTER — Telehealth (INDEPENDENT_AMBULATORY_CARE_PROVIDER_SITE_OTHER): Payer: Medicare HMO | Admitting: Family Medicine

## 2018-09-21 ENCOUNTER — Other Ambulatory Visit: Payer: Self-pay

## 2018-09-21 DIAGNOSIS — Z20828 Contact with and (suspected) exposure to other viral communicable diseases: Secondary | ICD-10-CM | POA: Diagnosis not present

## 2018-09-21 DIAGNOSIS — Z20822 Contact with and (suspected) exposure to covid-19: Secondary | ICD-10-CM

## 2018-09-21 DIAGNOSIS — R059 Cough, unspecified: Secondary | ICD-10-CM

## 2018-09-21 DIAGNOSIS — R05 Cough: Secondary | ICD-10-CM | POA: Diagnosis not present

## 2018-09-21 DIAGNOSIS — R6889 Other general symptoms and signs: Secondary | ICD-10-CM | POA: Diagnosis not present

## 2018-09-21 NOTE — Progress Notes (Signed)
Virtual Visit via Telephone Note  I connected with Matthew Simon on 09/21/18 at 8:46 AM by telephone and verified that I am speaking with the correct person using two identifiers.   I discussed the limitations, risks, security and privacy concerns of performing an evaluation and management service by telephone and the availability of in person appointments. I also discussed with the patient that there may be a patient responsible charge related to this service. The patient expressed understanding and agreed to proceed, consent obtained  Chief complaint: covid testing.   History of Present Illness: Matthew Simon is a 68 y.o. male  Thinks may have been exposed to Covid. 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 Vendor was exposed and then around [name].  Sitting in conference room. Had pulled mask below nose and vendor was not wearing a mask - was 6 foot apart.   No current symptoms.  Slight cough the other day - slight barlk cough 5 days ago, but now gone past 2 days. No fever - checking temp.  No loss of taste/smell, no dyspnea, no n/v/d.  Able to exercise and workout and not feeling any physical symptoms at this time.   dtr coming in town in 4 days - wants to make sure he is ok.    Patient Active Problem List   Diagnosis Date Noted  . Soft tissue mass 05/22/2017  . Gout 05/05/2016  . Hypertension 04/10/2011  . Colon polyp 04/10/2011   Past Medical History:  Diagnosis Date  . Gout   . Hypertension    Past Surgical History:  Procedure Laterality Date  . COLONOSCOPY    . EXCISION MASS NECK N/A 05/24/2017   Procedure: EXCISION POSTERIOR NECK MASS;  Surgeon: Armandina Gemma, MD;  Location: [place];  Service: General;  Laterality: N/A;  . WISDOM TOOTH EXTRACTION     No Known Allergies Prior to Admission medications   Medication Sig Start Date End Date Taking? Authorizing Provider  allopurinol (ZYLOPRIM) 100 MG tablet Take 1 tablet (100 mg total) by  mouth daily. 03/23/18  Yes Wendie Agreste, MD  amLODipine (NORVASC) 5 MG tablet Take 1 tablet (5 mg total) by mouth daily. 03/23/18  Yes Wendie Agreste, MD  lisinopril (PRINIVIL,ZESTRIL) 20 MG tablet Take 1 tablet (20 mg total) by mouth daily. 03/23/18  Yes Wendie Agreste, MD   Social History   Socioeconomic History  . Marital status: Married    Spouse name: Not on file  . Number of children: Not on file  . Years of education: Not on file  . Highest education level: Not on file  Occupational History  . Occupation: business development  Social Needs  . Financial resource strain: Not on file  . Food insecurity    Worry: Not on file    Inability: Not on file  . Transportation needs    Medical: Not on file    Non-medical: Not on file  Tobacco Use  . Smoking status: Former Smoker    Quit date: 05/10/1964    Years since quitting: 54.4  . Smokeless tobacco: Never Used  Substance and Sexual Activity  . Alcohol use: Yes    Alcohol/week: 21.0 standard drinks    Types: 21 Standard drinks or equivalent per week    Comment: wine with dinner  . Drug use: No  . Sexual activity: Not on file  Lifestyle  . Physical activity    Days per week: Not on file  Minutes per session: Not on file  . Stress: Not on file  Relationships  . Social Herbalist on phone: Not on file    Gets together: Not on file    Attends religious service: Not on file    Active member of club or organization: Not on file    Attends meetings of clubs or organizations: Not on file    Relationship status: Not on file  . Intimate partner violence    Fear of current or ex partner: Not on file    Emotionally abused: Not on file    Physically abused: Not on file    Forced sexual activity: Not on file  Other Topics Concern  . Not on file  Social History Narrative  . Not on file     Observations/Objective: No distress.  Speaking in full sentences, no cough during phone call. No home vitals  obtained, but has been checking temperature which is been reportedly normal as above There were no vitals filed for this visit.   Assessment and Plan: Close Exposure to Covid-19 Virus - Plan: Novel Coronavirus, NAA ([facility]),  Cough - Plan: Novel Coronavirus, NAA ([facility]),   -Asymptomatic at this time, did have slight cough for few days.  Possible indirect exposure to COVID-19.  -Self isolation recommended at this time, drive up testing ordered for today.  RTC precautions given   Follow Up Instructions:    Patient Instructions  Although less likely COVID, I will have you tested.  Go to the drive up center at [facility] location today for that testing.  Because of the previous cough, I would recommend isolating as much as possible and wearing mask at all times until you have received your test results.  If any new or worsening symptoms please let me know.    I discussed the assessment and treatment plan with the patient. The patient was provided an opportunity to ask questions and all were answered. The patient agreed with the plan and demonstrated an understanding of the instructions.   The patient was advised to call back or seek an in-person evaluation if the symptoms worsen or if the condition fails to improve as anticipated.  I provided 11 minutes of non-face-to-face time during this encounter.  Signed,   Merri Ray, MD [facility] [place].  09/21/18

## 2018-09-21 NOTE — Progress Notes (Signed)
CC- Covid tested- Just want to be tested not having any symptoms at time time.

## 2018-09-21 NOTE — Patient Instructions (Signed)
Although less likely COVID, I will have you tested.  Go to the drive up center at [facility] location today for that testing.  Because of the previous cough, I would recommend isolating as much as possible and wearing mask at all times until you have received your test results.  If any new or worsening symptoms please let me know.

## 2018-09-22 LAB — NOVEL CORONAVIRUS, NAA: SARS-CoV-2, NAA: DETECTED — AB

## 2018-09-24 ENCOUNTER — Other Ambulatory Visit: Payer: Self-pay | Admitting: Family Medicine

## 2018-09-24 DIAGNOSIS — I10 Essential (primary) hypertension: Secondary | ICD-10-CM

## 2018-09-24 DIAGNOSIS — M109 Gout, unspecified: Secondary | ICD-10-CM

## 2018-09-24 NOTE — Telephone Encounter (Signed)
Forwarding medication refill request to the clinical pool for review. 

## 2018-09-26 ENCOUNTER — Telehealth: Payer: Self-pay | Admitting: Family Medicine

## 2018-09-26 NOTE — Telephone Encounter (Signed)
Pt's wife called in and requested a call to pt from Dr. Carlota Raspberry. States husband is wheezing and not doing well. Telemed done 8/19

## 2018-09-26 NOTE — Telephone Encounter (Signed)
Pt is wanting Dr. Carlota Raspberry to give him a call. Pt tested positive for covid. Pt states his temperature has gone up a little bit and his oxygen level is 95.

## 2018-09-27 ENCOUNTER — Telehealth: Payer: Self-pay | Admitting: Family Medicine

## 2018-09-27 NOTE — Telephone Encounter (Signed)
Spoke with pt and advised him that if he is still having an elevated temp, wheezing, and SHOB that he needs to be evaluated in the ER.  Pt states he does not feel any better at this time. He informed me that he will go over the the ER for further evaluation.

## 2018-09-27 NOTE — Telephone Encounter (Signed)
Patient tested positive for COVID and would like a phone call from Dr Carlota Raspberry , he has elevated temperature

## 2018-09-27 NOTE — Telephone Encounter (Signed)
TC from pt.  Clarifying what he needs to do.  States temp is "all over the place" ranging 99.1-99.3.  O2 sat is running 91-93.  Denies any SOB, denies wheezing, states it was his wife reporting those s/s on earlier call.  Reports periodic cough while lying down but that it is "nothing heavy duty".  Reports being a little more tired but states he wouldn't classify it as fatigue.  Advised pt that if O2 lowers any further or if he develops SOB or any difficulty breathing, he needs to go to emergency room.  Pt had numerous questions re if he would be admitted, what they would do at ER, etc.  Advised that he would be treated based on his presentation but would most likely need supplemental oxygen.  Unable to advise as to any other potential tx.  Pt verbalized understanding.

## 2018-09-28 ENCOUNTER — Encounter: Payer: Self-pay | Admitting: Family Medicine

## 2018-09-28 ENCOUNTER — Telehealth (INDEPENDENT_AMBULATORY_CARE_PROVIDER_SITE_OTHER): Payer: Medicare HMO | Admitting: Family Medicine

## 2018-09-28 DIAGNOSIS — J988 Other specified respiratory disorders: Secondary | ICD-10-CM

## 2018-09-28 DIAGNOSIS — U071 COVID-19: Secondary | ICD-10-CM | POA: Diagnosis not present

## 2018-09-28 DIAGNOSIS — R05 Cough: Secondary | ICD-10-CM | POA: Diagnosis not present

## 2018-09-28 DIAGNOSIS — R059 Cough, unspecified: Secondary | ICD-10-CM

## 2018-09-28 NOTE — Progress Notes (Signed)
Virtual Visit via Telephone Note  I connected with Matthew Simon on 09/28/18 at 1:21 PM by telephone and verified that I am speaking with the correct person using two identifiers.   I discussed the limitations, risks, security and privacy concerns of performing an evaluation and management service by telephone and the availability of in person appointments. I also discussed with the patient that there may be a patient responsible charge related to this service. The patient expressed understanding and agreed to proceed, consent obtained  Chief complaint: Covid 19  History of Present Illness: Matthew Simon is a 68 y.o. male  Follow-up of COVID-19.  Initial telemedicine visit on August 19, potential exposure to COVID-19 previous Wednesday.  He did have a slight cough initially with a slight barking cough 5 days previous but had been resolved for the prior 2 days.  No fever, no change in taste smell or dyspnea at that time.  Diagnosed with positive test on August 21.  When I called him on that day still had normal temps at home with a T-max of 99.4.  Cough had continued to be gone.  No body aches, and had continue to self quarantine.  Telephone notes from yesterday reviewed.  Initial note August 24, then per note yesterday was advised to be seen in the ER for shortness of breath and wheezing.  Discussed with clinical manager yesterday afternoon.  O2 sat reportedly 91-93 at home but no shortness of breath, no wheezing.  Periodic cough and lying down but minimal.  Some fatigue.  ER precautions given.  Today: Still some cough. Not short of breath, just episodic cough when talking  Episodic dizziness, but no syncope/near syncope.  Temp 99.1-100.  Headache today. No focal weakness, slurred speech or facial droop.  Home O2sat 94-95, lowest on 91 on R hand. L hand 95-96%. No readings below 90. Checking on home meter.  No disorientation/confusion.  Able to drink fluids and gatorade. Able to  tolerate some food as well.   Tx: tylenol this am once.    Patient Active Problem List   Diagnosis Date Noted  . Soft tissue mass 05/22/2017  . Gout 05/05/2016  . Hypertension 04/10/2011  . Colon polyp 04/10/2011   Past Medical History:  Diagnosis Date  . Gout   . Hypertension    Past Surgical History:  Procedure Laterality Date  . COLONOSCOPY    . EXCISION MASS NECK N/A 05/24/2017   Procedure: EXCISION POSTERIOR NECK MASS;  Surgeon: Armandina Gemma, MD;  Location: [place];  Service: General;  Laterality: N/A;  . WISDOM TOOTH EXTRACTION     No Known Allergies Prior to Admission medications   Medication Sig Start Date End Date Taking? Authorizing Provider  allopurinol (ZYLOPRIM) 100 MG tablet TAKE 1 TABLET BY MOUTH EVERY DAY 09/26/18  Yes Wendie Agreste, MD  amLODipine (NORVASC) 5 MG tablet TAKE 1 TABLET BY MOUTH EVERY DAY 09/26/18  Yes Wendie Agreste, MD  lisinopril (PRINIVIL,ZESTRIL) 20 MG tablet Take 1 tablet (20 mg total) by mouth daily. 03/23/18  Yes Wendie Agreste, MD   Social History   Socioeconomic History  . Marital status: Married    Spouse name: Not on file  . Number of children: Not on file  . Years of education: Not on file  . Highest education level: Not on file  Occupational History  . Occupation: business development  Social Needs  . Financial resource strain: Not on file  . Food insecurity  Worry: Not on file    Inability: Not on file  . Transportation needs    Medical: Not on file    Non-medical: Not on file  Tobacco Use  . Smoking status: Former Smoker    Quit date: 05/10/1964    Years since quitting: 54.4  . Smokeless tobacco: Never Used  Substance and Sexual Activity  . Alcohol use: Yes    Alcohol/week: 21.0 standard drinks    Types: 21 Standard drinks or equivalent per week    Comment: wine with dinner  . Drug use: No  . Sexual activity: Not on file  Lifestyle  . Physical activity    Days per week: Not on file    Minutes per  session: Not on file  . Stress: Not on file  Relationships  . Social Herbalist on phone: Not on file    Gets together: Not on file    Attends religious service: Not on file    Active member of club or organization: Not on file    Attends meetings of clubs or organizations: Not on file    Relationship status: Not on file  . Intimate partner violence    Fear of current or ex partner: Not on file    Emotionally abused: Not on file    Physically abused: Not on file    Forced sexual activity: Not on file  Other Topics Concern  . Not on file  Social History Narrative  . Not on file     Observations/Objective: Speaking in full sentences, no apparent dyspnea, no cough heard throughout visit.  Appropriate/coherent responses without apparent distress. There were no vitals filed for this visit.  Assessment and Plan: Cough  COVID-19  -Initial improvement in symptoms, now with worsening cough.  Denies true dyspnea.  Reassuring discussion over phone but ER precautions were discussed including but not limited to increasing dyspnea at rest, confusion, inability to tolerate fluids, or other acute changes in symptoms.  Understanding expressed.  Recheck 48 hours by telemedicine  Follow Up Instructions: 2 days - telemed.    I discussed the assessment and treatment plan with the patient. The patient was provided an opportunity to ask questions and all were answered. The patient agreed with the plan and demonstrated an understanding of the instructions.   The patient was advised to call back or seek an in-person evaluation if the symptoms worsen or if the condition fails to improve as anticipated.  I provided 13 minutes of non-face-to-face time during this encounter.  Signed,   Merri Ray, MD [facility] [place].  09/28/18

## 2018-09-28 NOTE — Progress Notes (Signed)
Pos Test for COVID. 09/21/18 Tested due to exposure

## 2018-09-28 NOTE — Patient Instructions (Signed)
Continue Tylenol as needed for headache, body aches.  Mucinex as needed for cough.  Make sure to drink plenty of fluids as dehydration can also cause headaches.  If you are short of breath, especially shortness of breath at rest, confusion, disorientation, worsening lightheadedness or dizziness to the point where you feel you may pass out, or other acute worsening symptoms, be seen in the emergency room.  Additionally if your home oxygen meter does report anything less than 90 and you are short of breath, call 911 or go to the emergency room for further evaluation at that time as well.  Follow-up by telemedicine visit in 2 days.  Let us know if there are any changes sooner.  Take care

## 2018-09-30 ENCOUNTER — Telehealth (INDEPENDENT_AMBULATORY_CARE_PROVIDER_SITE_OTHER): Payer: Medicare HMO | Admitting: Family Medicine

## 2018-09-30 ENCOUNTER — Ambulatory Visit (INDEPENDENT_AMBULATORY_CARE_PROVIDER_SITE_OTHER): Payer: Medicare HMO

## 2018-09-30 ENCOUNTER — Encounter (HOSPITAL_COMMUNITY): Payer: Self-pay

## 2018-09-30 ENCOUNTER — Telehealth: Payer: Self-pay

## 2018-09-30 ENCOUNTER — Ambulatory Visit (HOSPITAL_COMMUNITY)
Admission: EM | Admit: 2018-09-30 | Discharge: 2018-09-30 | Disposition: A | Discharge status: Home / Self Care | Payer: Medicare HMO | Attending: Emergency Medicine | Admitting: Emergency Medicine

## 2018-09-30 ENCOUNTER — Other Ambulatory Visit: Payer: Self-pay

## 2018-09-30 DIAGNOSIS — R05 Cough: Secondary | ICD-10-CM | POA: Diagnosis not present

## 2018-09-30 DIAGNOSIS — J9811 Atelectasis: Secondary | ICD-10-CM

## 2018-09-30 DIAGNOSIS — J988 Other specified respiratory disorders: Secondary | ICD-10-CM

## 2018-09-30 DIAGNOSIS — U071 COVID-19: Secondary | ICD-10-CM

## 2018-09-30 DIAGNOSIS — R059 Cough, unspecified: Secondary | ICD-10-CM

## 2018-09-30 DIAGNOSIS — R0602 Shortness of breath: Secondary | ICD-10-CM | POA: Diagnosis not present

## 2018-09-30 MED ORDER — ALBUTEROL SULFATE HFA 108 (90 BASE) MCG/ACT IN AERS: 1.0000 | INHALATION_SPRAY | Freq: Four times a day (QID) | RESPIRATORY_TRACT | 0 refills | Status: DC | PRN

## 2018-09-30 MED ORDER — PREDNISONE 10 MG PO TABS: 40.0000 mg | ORAL_TABLET | Freq: Every day | ORAL | 0 refills | Status: AC

## 2018-09-30 NOTE — Progress Notes (Signed)
Virtual Visit via Telephone Note  I connected with Matthew Simon on 09/30/18 at 3:20 PM by telephone and verified that I am speaking with the correct person using two identifiers.   I discussed the limitations, risks, security and privacy concerns of performing an evaluation and management service by telephone and the availability of in person appointments. I also discussed with the patient that there may be a patient responsible charge related to this service. The patient expressed understanding and agreed to proceed, consent obtained  Chief complaint:  COVID-19 follow-up.  History of Present Illness: Matthew Simon is a 68 y.o. male  Follow-up with COVID-19 infection.  Initial telemedicine visit on August 19 with potential exposure on August 12.  Slight, with slight barking cough approximately August 14, improved by the 17th. Positive test result on 8/21 normal times at that time, feeling well without cough  Last evaluation 2 days ago by telemedicine.  Telephone notes previously indicated possible wheezing shortness of breath.  Clarified that he was not having shortness of breath or wheezing at home, O2 sat reportedly 91-93, periodic cough, fatigue.  When I spoke to him on the 26th still has some cough but not short of breath just episodic cough with talking.  Episodic dizziness but no syncope or near syncope, temps have ranged from 99.1-100, was having some headache that day without other focal neurologic symptoms.  Home O2 sats reportedly 94-95, lowest in his right hand at 91 but when rechecked was 95-96 on his left hand.  There was no disorientation and confusion, was tolerating fluids and eating.   Still with similar symptoms of cough with talking - dry cough. Stuttering cough with speaking. More annoying cough with trying to talk, and feels like losing breath in middle of sentence, but otherwise not feeling short of breath. No postnasal drip or congestion.  home pulse ox 95-98%.   Home temp 97.6.  Last fever yesterday at 6pm. 99.9.  tx tylenol and mucinex.  No meds taken today.  Feels better than 2 days ago-that was worst day with hot and cold spells.  Headache has now resolved. No new bodyaches.   Currently at Urgent Care to be seen and evaluated for possible XR. Being called back at the end of our call - he will have further evaluation there.     Patient Active Problem List   Diagnosis Date Noted  . Soft tissue mass 05/22/2017  . Gout 05/05/2016  . Hypertension 04/10/2011  . Colon polyp 04/10/2011   Past Medical History:  Diagnosis Date  . Gout   . Hypertension    Past Surgical History:  Procedure Laterality Date  . COLONOSCOPY    . EXCISION MASS NECK N/A 05/24/2017   Procedure: EXCISION POSTERIOR NECK MASS;  Surgeon: Armandina Gemma, MD;  Location: [place];  Service: General;  Laterality: N/A;  . WISDOM TOOTH EXTRACTION     No Known Allergies Prior to Admission medications   Medication Sig Start Date End Date Taking? Authorizing Provider  allopurinol (ZYLOPRIM) 100 MG tablet TAKE 1 TABLET BY MOUTH EVERY DAY 09/26/18  Yes Wendie Agreste, MD  amLODipine (NORVASC) 5 MG tablet TAKE 1 TABLET BY MOUTH EVERY DAY 09/26/18  Yes Wendie Agreste, MD  lisinopril (PRINIVIL,ZESTRIL) 20 MG tablet Take 1 tablet (20 mg total) by mouth daily. 03/23/18  Yes Wendie Agreste, MD   Social History   Socioeconomic History  . Marital status: Married    Spouse name: Not on file  .  Number of children: Not on file  . Years of education: Not on file  . Highest education level: Not on file  Occupational History  . Occupation: business development  Social Needs  . Financial resource strain: Not on file  . Food insecurity    Worry: Not on file    Inability: Not on file  . Transportation needs    Medical: Not on file    Non-medical: Not on file  Tobacco Use  . Smoking status: Former Smoker    Quit date: 05/10/1964    Years since quitting: 54.4  . Smokeless tobacco:  Never Used  Substance and Sexual Activity  . Alcohol use: Yes    Alcohol/week: 21.0 standard drinks    Types: 21 Standard drinks or equivalent per week    Comment: wine with dinner  . Drug use: No  . Sexual activity: Not on file  Lifestyle  . Physical activity    Days per week: Not on file    Minutes per session: Not on file  . Stress: Not on file  Relationships  . Social Herbalist on phone: Not on file    Gets together: Not on file    Attends religious service: Not on file    Active member of club or organization: Not on file    Attends meetings of clubs or organizations: Not on file    Relationship status: Not on file  . Intimate partner violence    Fear of current or ex partner: Not on file    Emotionally abused: Not on file    Physically abused: Not on file    Forced sexual activity: Not on file  Other Topics Concern  . Not on file  Social History Narrative  . Not on file     Observations/Objective: Cough few times during eval on phone, but coherent responses. Hoarse voice. No apparent respiratory distress.   Assessment and Plan: Cough  COVID-19  -Initial symptoms approximately 2 weeks ago, resolved, then recurred.  Irritant cough primary issue with headache, chills 2 days ago, now improving.  Does report more annoyance with irritant cough, and some possible dyspnea during conversation but not with other activity.  Other symptoms reportedly improving including headache and afebrile today.  Due to timing of symptoms, possible secondary lower respiratory infection/pneumonia but denies lower respiratory symptoms and reassuring home O2 sat  -Plan for continued evaluation through urgent care today, can decide on other testing/imaging if needed, then follow-up to be determined by their exam and disposition.  Follow Up Instructions: As needed depending on evaluation through urgent care today.   I discussed the assessment and treatment plan with the patient. The  patient was provided an opportunity to ask questions and all were answered. The patient agreed with the plan and demonstrated an understanding of the instructions.   The patient was advised to call back or seek an in-person evaluation if the symptoms worsen or if the condition fails to improve as anticipated.  I provided 5 minutes of non-face-to-face time during this encounter.  Signed,   Merri Ray, MD [facility] at [place].  09/30/18

## 2018-09-30 NOTE — Patient Instructions (Signed)
° ° ° °  If you have lab work done today you will be contacted with your lab results within the next 2 weeks.  If you have not heard from us then please contact us. The fastest way to get your results is to register for My Chart. ° ° °IF you received an x-ray today, you will receive an invoice from [facility]. Please contact [facility] at [phone] with questions or concerns regarding your invoice.  ° °IF you received labwork today, you will receive an invoice from [facility]. Please contact [facility] at [phone] with questions or concerns regarding your invoice.  ° °Our billing staff will not be able to assist you with questions regarding bills from these companies. ° °You will be contacted with the lab results as soon as they are available. The fastest way to get your results is to activate your My Chart account. Instructions are located on the last page of this paperwork. If you have not heard from us regarding the results in 2 weeks, please contact this office. °  ° ° ° °

## 2018-09-30 NOTE — Discharge Instructions (Signed)
Take the steroid once a day for 5 days.  Use the albuterol inhaler as directed.  You should continue to self quarantine.  Go to the emergency department if you develop difficulty breathing, shortness of breath, high fever, severe diarrhea, or other concerning symptoms.    Follow-up with your primary care provider on Monday.

## 2018-09-30 NOTE — ED Provider Notes (Signed)
[place]    CSN: [id number] Arrival date & time: 09/30/18  1457      History   Chief Complaint Chief Complaint  Patient presents with  . Cough    HPI Matthew Simon is a 68 y.o. male.   Patient presents with a nonproductive cough and shortness of breath x1 day.  He tested COVID positive on 09/21/2018.  He states he called his PCP and was told to come here for a chest x-ray.  He denies fever, chills, sore throat, vomiting, diarrhea, or other symptoms.  The history is provided by the patient.    Past Medical History:  Diagnosis Date  . Gout   . Hypertension     Patient Active Problem List   Diagnosis Date Noted  . Soft tissue mass 05/22/2017  . Gout 05/05/2016  . Hypertension 04/10/2011  . Colon polyp 04/10/2011    Past Surgical History:  Procedure Laterality Date  . COLONOSCOPY    . EXCISION MASS NECK N/A 05/24/2017   Procedure: EXCISION POSTERIOR NECK MASS;  Surgeon: Armandina Gemma, MD;  Location: [facility];  Service: General;  Laterality: N/A;  . WISDOM TOOTH EXTRACTION         Home Medications    Prior to Admission medications   Medication Sig Start Date End Date Taking? Authorizing Provider  albuterol (VENTOLIN HFA) 108 (90 Base) MCG/ACT inhaler Inhale 1-2 puffs into the lungs every 6 (six) hours as needed for wheezing or shortness of breath. 09/30/18   Sharion Balloon, NP  allopurinol (ZYLOPRIM) 100 MG tablet TAKE 1 TABLET BY MOUTH EVERY DAY 09/26/18   Wendie Agreste, MD  amLODipine (NORVASC) 5 MG tablet TAKE 1 TABLET BY MOUTH EVERY DAY 09/26/18   Wendie Agreste, MD  lisinopril (PRINIVIL,ZESTRIL) 20 MG tablet Take 1 tablet (20 mg total) by mouth daily. 03/23/18   Wendie Agreste, MD  predniSONE (DELTASONE) 10 MG tablet Take 4 tablets (40 mg total) by mouth daily for 5 days. 09/30/18 10/05/18  Sharion Balloon, NP    Family History Family History  Problem Relation Age of Onset  . Cancer Mother        lymphoscarcoma  . Stroke Father   . Colon cancer  Neg Hx   . Rectal cancer Neg Hx     Social History Social History   Tobacco Use  . Smoking status: Former Smoker    Quit date: 05/10/1964    Years since quitting: 54.4  . Smokeless tobacco: Never Used  Substance Use Topics  . Alcohol use: Yes    Alcohol/week: 21.0 standard drinks    Types: 21 Standard drinks or equivalent per week    Comment: wine with dinner  . Drug use: No     Allergies   Patient has no known allergies.   Review of Systems Review of Systems  Constitutional: Negative for chills and fever.  HENT: Negative for congestion, ear pain, rhinorrhea and sore throat.   Eyes: Negative for pain and visual disturbance.  Respiratory: Positive for cough and shortness of breath.   Cardiovascular: Negative for chest pain and palpitations.  Gastrointestinal: Negative for abdominal pain, diarrhea and vomiting.  Genitourinary: Negative for dysuria and hematuria.  Musculoskeletal: Negative for arthralgias and back pain.  Skin: Negative for color change and rash.  Neurological: Negative for seizures and syncope.  All other systems reviewed and are negative.    Physical Exam Triage Vital Signs ED Triage Vitals  Enc Vitals Group  BP      Pulse      Resp      Temp      Temp src      SpO2      Weight      Height      Head Circumference      Peak Flow      Pain Score      Pain Loc      Pain Edu?      Excl. in [place]?    No data found.  Updated Vital Signs BP 127/84 (BP Location: Left Arm)   Pulse 76   Temp 98.9 F (37.2 C) (Oral)   Resp 16   SpO2 100%   Visual Acuity Right Eye Distance:   Left Eye Distance:   Bilateral Distance:    Right Eye Near:   Left Eye Near:    Bilateral Near:     Physical Exam Vitals signs and nursing note reviewed.  Constitutional:      General: He is not in acute distress.    Appearance: He is well-developed. He is not ill-appearing.  HENT:     Head: Normocephalic and atraumatic.     Right Ear: Tympanic membrane  normal.     Left Ear: Tympanic membrane normal.     Nose: Nose normal.     Mouth/Throat:     Mouth: Mucous membranes are moist.     Pharynx: Oropharynx is clear.  Eyes:     Conjunctiva/sclera: Conjunctivae normal.  Neck:     Musculoskeletal: Neck supple.  Cardiovascular:     Rate and Rhythm: Normal rate and regular rhythm.     Heart sounds: No murmur.  Pulmonary:     Effort: Pulmonary effort is normal. No respiratory distress.     Breath sounds: Normal breath sounds. No wheezing or rhonchi.  Abdominal:     General: Bowel sounds are normal.     Palpations: Abdomen is soft.     Tenderness: There is no abdominal tenderness. There is no guarding or rebound.  Skin:    General: Skin is warm and dry.     Capillary Refill: Capillary refill takes less than 2 seconds.     Findings: No rash.  Neurological:     Mental Status: He is alert and oriented to person, place, and time.  Psychiatric:        Mood and Affect: Mood normal.        Behavior: Behavior normal.      UC Treatments / Results  Labs (all labs ordered are listed, but only abnormal results are displayed) Labs Reviewed - No data to display  EKG   Radiology Dg Chest 2 View  Result Date: 09/30/2018 CLINICAL DATA:  Cough, shortness of breath. EXAM: CHEST - 2 VIEW COMPARISON:  Radiographs of February 26, 2010. FINDINGS: The heart size and mediastinal contours are within normal limits. No pneumothorax or pleural effusion is noted. Minimal bibasilar subsegmental atelectasis is noted. The visualized skeletal structures are unremarkable. IMPRESSION: Minimal bibasilar subsegmental atelectasis. Electronically Signed   By: Marijo Conception M.D.   On: 09/30/2018 16:21    Procedures Procedures (including critical care time)  Medications Ordered in UC Medications - No data to display  Initial Impression / Assessment and Plan / UC Course  I have reviewed the triage vital signs and the nursing notes.  Pertinent labs & imaging  results that were available during my care of the patient were reviewed by me and considered  in my medical decision making (see chart for details).   Atelectesis, COVID positive.  X-ray shows minimal bibasilar subsegmental atelectasis.  Treating with prednisone and albuterol MDI.  Instructed patient to continue to self quarantine.  Discussed with patient that he should go to the emergency department if he develops difficulty breathing, shortness of breath, high fever, severe diarrhea, or other concerning symptoms.  Instructed patient to follow-up with his PCP on Monday.  Patient agrees with plan of care.       Final Clinical Impressions(s) / UC Diagnoses   Final diagnoses:  COVID-19  Atelectasis     Discharge Instructions     Take the steroid once a day for 5 days.  Use the albuterol inhaler as directed.  You should continue to self quarantine.  Go to the emergency department if you develop difficulty breathing, shortness of breath, high fever, severe diarrhea, or other concerning symptoms.    Follow-up with your primary care provider on Monday.        ED Prescriptions    Medication Sig Dispense Auth. Provider   predniSONE (DELTASONE) 10 MG tablet Take 4 tablets (40 mg total) by mouth daily for 5 days. 20 tablet Sharion Balloon, NP   albuterol (VENTOLIN HFA) 108 (90 Base) MCG/ACT inhaler Inhale 1-2 puffs into the lungs every 6 (six) hours as needed for wheezing or shortness of breath. 18 g Sharion Balloon, NP     Controlled Substance Prescriptions [place] Controlled Substance Registry consulted? Not Applicable   Sharion Balloon, NP 09/30/18 [phone]

## 2018-09-30 NOTE — ED Triage Notes (Signed)
Pt presents to UC with c/o coughing everytime he starts talking since yesterday. Pt reports seeing primary doctor today and stated that the doctor told him to go to UC to receive chest xray here.

## 2018-09-30 NOTE — Telephone Encounter (Signed)
Call from pt who is requesting x-ray prior to this afternoon's appointment.  Reports difficulty completing sentence without taking a breath or coughing.  Reports improvement in SaO2 levels which are running about 98-99.  Temp went up to 100.3 yesterday but has been back down since then and is now 97.4.  HR 84.  States he does not have that feverish feeling any longer but feels like something is in his throat and has had an increase in cough.  Would like provider to order x-ray prior to appt.   L/m for pt to c/b.  If he calls back, please let him know that provider was made aware.  Provider's recommendation is that if he feels his breathing or overall condition is worsening, he should forego today's virtual appt and go straight to ED where they can offer x-ray and a more thorough in person assessment.

## 2018-09-30 NOTE — ED Triage Notes (Signed)
Pt has tested positive for COVID-19 last wednesday

## 2018-09-30 NOTE — Progress Notes (Signed)
CC: 2 day recheck on covid cough.  Per pt when he speaks in complete sentences it triggers his cough.  No SOB per pt . Worst day for fever was on Wednesday- the lowest temp of 99.25 and highest 100.4 and then began to go down.  Pt wants chest x ray done before virtual visit.  Pt's Temp 97.4, pulse : 84 and O2 98% per pt.  No travel outside the [place] or [place] in the past 3 weeks.

## 2018-10-03 ENCOUNTER — Other Ambulatory Visit: Payer: Self-pay | Admitting: Family Medicine

## 2018-10-03 DIAGNOSIS — I10 Essential (primary) hypertension: Secondary | ICD-10-CM

## 2018-10-03 NOTE — Telephone Encounter (Signed)
Requested medication (s) are due for refill today: yes  Requested medication (s) are on the active medication list: yes  Last refill: 07/04/2018  Future visit scheduled: no  Notes to clinic:  Review for refill   Requested Prescriptions  Pending Prescriptions Disp Refills   lisinopril (ZESTRIL) 20 MG tablet [Pharmacy Med Name: LISINOPRIL 20 MG TABLET] 90 tablet 1    Sig: TAKE 1 TABLET BY MOUTH EVERY DAY     Cardiovascular:  ACE Inhibitors Failed - 10/03/2018 12:11 AM      Failed - Cr in normal range and within 180 days    Creat  Date Value Ref Range Status  02/21/2015 1.28 (H) 0.70 - 1.25 mg/dL Final   Creatinine, Ser  Date Value Ref Range Status  03/21/2018 1.30 (H) 0.76 - 1.27 mg/dL Final         Failed - K in normal range and within 180 days    Potassium  Date Value Ref Range Status  03/21/2018 3.9 3.5 - 5.2 mmol/L Final         Failed - Valid encounter within last 6 months    Recent Outpatient Visits          6 months ago Annual physical exam   [facility], Ranell Patrick, MD   1 year ago Essential hypertension   [facility], Ranell Patrick, MD   2 years ago Abscess of left groin   [facility], [place], [place]   2 years ago Abscess of left groin   [facility], Ranell Patrick, MD   2 years ago Annual physical exam   [facility], Ranell Patrick, MD             Passed - Patient is not pregnant      Passed - Last BP in normal range    BP Readings from Last 1 Encounters:  09/30/18 127/84

## 2018-10-04 ENCOUNTER — Telehealth: Payer: Self-pay | Admitting: Family Medicine

## 2018-10-04 NOTE — Telephone Encounter (Signed)
Pt is waiting to hear from Dr. Carlota Raspberry reg his chest x-rays  , and pt had finished prednisone and pt is wondering what to do next.   Please call pt at [phone]

## 2018-10-05 NOTE — Telephone Encounter (Signed)
Reviewed ER notes on August 28.  It appears that his results and plan were discussed in the urgent care, but looking at these notes and x-ray report, there was only atelectasis on x-ray but no true pneumonia or signs of other infection.  Prednisone and albuterol inhaler were given with plan to follow-up with me.    Please schedule for follow-up telemedicine visit tomorrow afternoon, but if any acute worsening symptoms prior to that time can return to the ER or urgent care.

## 2018-10-05 NOTE — Telephone Encounter (Signed)
Called Pt and reviewed message from the provider and scheduled an Tel-med for tomorrow at 440. Pt stated understanding and will ask the rest of the questions to the provider tomorrow.

## 2018-10-05 NOTE — Telephone Encounter (Signed)
Pt is pos for COVID and just had a Chest X-Ray on 09/30/18 and requesting results.  ED visit was 09/30/18.   Please advise on results and what to do now besides quarantine.

## 2018-10-06 ENCOUNTER — Telehealth (INDEPENDENT_AMBULATORY_CARE_PROVIDER_SITE_OTHER): Payer: Medicare HMO | Admitting: Family Medicine

## 2018-10-06 ENCOUNTER — Other Ambulatory Visit: Payer: Self-pay

## 2018-10-06 DIAGNOSIS — U071 COVID-19: Secondary | ICD-10-CM

## 2018-10-06 DIAGNOSIS — R05 Cough: Secondary | ICD-10-CM

## 2018-10-06 DIAGNOSIS — R3911 Hesitancy of micturition: Secondary | ICD-10-CM | POA: Diagnosis not present

## 2018-10-06 DIAGNOSIS — J988 Other specified respiratory disorders: Secondary | ICD-10-CM | POA: Diagnosis not present

## 2018-10-06 DIAGNOSIS — R059 Cough, unspecified: Secondary | ICD-10-CM

## 2018-10-06 NOTE — Progress Notes (Signed)
CC- pos covid f/u- patient stated he is doing well. No symptoms. Finished the prednisone yday. Feeling fine

## 2018-10-06 NOTE — Progress Notes (Signed)
Virtual Visit via Telephone Note  I connected with Matthew Simon on 10/06/18 at 5:41 PM by telephone and verified that I am speaking with the correct person using two identifiers.   I discussed the limitations, risks, security and privacy concerns of performing an evaluation and management service by telephone and the availability of in person appointments. I also discussed with the patient that there may be a patient responsible charge related to this service. The patient expressed understanding and agreed to proceed, consent obtained  Chief complaint: Covid 19 follow up.   History of Present Illness: Matthew Simon is a 68 y.o. male  Following up of cough and COVID-19. Initial tele-med visit August 19, potential exposure August 12, positive test results on August 21 but feeling well at that time. Tele-med visit 09/30/2018 -still having similar symptoms of cough with talking, dry cough, stuttering cough with speaking.  Felt like he was losing breath and milliseconds but not feeling short of breath.  Worse today with hot and cold spells was approximately August 26.  Headache had resolved.  He was evaluated in urgent care that day, x-ray with bibasilar atelectasis but no infiltrate.  Treated with prednisone 40 mg daily x5 days and albuterol inhaler.  Finished prednisone 2 days ago.   3 puffs of albuterol in am and pm.  No cough in past 2 days.  No fevers.  Temp 98.1 O2 sat 98% Feels fine.   At end of visit - has some difficulty in starting urinating this morning. No fevers, no dysuria, or hematuria. Slight urge, then able to urinate.   PSA 1.3 in February   Patient Active Problem List   Diagnosis Date Noted  . Soft tissue mass 05/22/2017  . Gout 05/05/2016  . Hypertension 04/10/2011  . Colon polyp 04/10/2011   Past Medical History:  Diagnosis Date  . Gout   . Hypertension    Past Surgical History:  Procedure Laterality Date  . COLONOSCOPY    . EXCISION MASS NECK N/A  05/24/2017   Procedure: EXCISION POSTERIOR NECK MASS;  Surgeon: Armandina Gemma, MD;  Location: [facility];  Service: General;  Laterality: N/A;  . WISDOM TOOTH EXTRACTION     No Known Allergies Prior to Admission medications   Medication Sig Start Date End Date Taking? Authorizing Provider  albuterol (VENTOLIN HFA) 108 (90 Base) MCG/ACT inhaler Inhale 1-2 puffs into the lungs every 6 (six) hours as needed for wheezing or shortness of breath. 09/30/18  Yes Sharion Balloon, NP  allopurinol (ZYLOPRIM) 100 MG tablet TAKE 1 TABLET BY MOUTH EVERY DAY 09/26/18  Yes Wendie Agreste, MD  amLODipine (NORVASC) 5 MG tablet TAKE 1 TABLET BY MOUTH EVERY DAY 09/26/18  Yes Wendie Agreste, MD  lisinopril (ZESTRIL) 20 MG tablet TAKE 1 TABLET BY MOUTH EVERY DAY 10/05/18  Yes Wendie Agreste, MD   Social History   Socioeconomic History  . Marital status: Married    Spouse name: Not on file  . Number of children: Not on file  . Years of education: Not on file  . Highest education level: Not on file  Occupational History  . Occupation: business development  Social Needs  . Financial resource strain: Not on file  . Food insecurity    Worry: Not on file    Inability: Not on file  . Transportation needs    Medical: Not on file    Non-medical: Not on file  Tobacco Use  . Smoking status: Former Smoker  Quit date: 05/10/1964    Years since quitting: 54.4  . Smokeless tobacco: Never Used  Substance and Sexual Activity  . Alcohol use: Yes    Alcohol/week: 21.0 standard drinks    Types: 21 Standard drinks or equivalent per week    Comment: wine with dinner  . Drug use: No  . Sexual activity: Not on file  Lifestyle  . Physical activity    Days per week: Not on file    Minutes per session: Not on file  . Stress: Not on file  Relationships  . Social Herbalist on phone: Not on file    Gets together: Not on file    Attends religious service: Not on file    Active member of club or organization:  Not on file    Attends meetings of clubs or organizations: Not on file    Relationship status: Not on file  . Intimate partner violence    Fear of current or ex partner: Not on file    Emotionally abused: Not on file    Physically abused: Not on file    Forced sexual activity: Not on file  Other Topics Concern  . Not on file  Social History Narrative  . Not on file     Observations/Objective: Speaking normally throughout visit, no cough.  Appropriate sponsors, understanding of plan expressed with all questions answered.   Assessment and Plan: COVID-19 Cough  -Improved, possible reactive airway component with recent prednisone and improvement in symptoms.  Can taper off albuterol at this time as needed cough/wheezing/shortness of breath.  RTC precautions if increased/frequent need.  Urinary hesitancy  -Mild symptoms starting just today.  Does not appear to be side effect from recent medications.  Minimal symptoms at present.  Most recent PSA earlier this year looked okay.  If persistent symptoms in the next 24 to 48 hours, recommended evaluation with possible urinalysis, PSA.  RTC/ER precautions given.  Follow Up Instructions: As needed.   Patient Instructions  I'm glad to hear that you are improving.  Form here forward you should continue to improve. Ok to wean off albuterol to only as needed at this point. If wheezing, cough or short of breath - can use albuterol as needed. If you continue to need albuterol after next week - follow up to discuss further treatment if needed.   If urinary symptoms are not improving in the next 24 to 48 hours, would recommend evaluation for possible urine testing and possible prostate testing at that time.  If any worsening symptoms, or unable to urinate, be seen right away as we discussed.  Return to the clinic or go to the nearest emergency room if any of your symptoms acutely worsen or new symptoms occur.      I discussed the assessment and  treatment plan with the patient. The patient was provided an opportunity to ask questions and all were answered. The patient agreed with the plan and demonstrated an understanding of the instructions.   The patient was advised to call back or seek an in-person evaluation if the symptoms worsen or if the condition fails to improve as anticipated.  I provided 25 minutes of non-face-to-face time during this encounter.  Signed,   Merri Ray, MD [facility].  10/06/18

## 2018-10-06 NOTE — Patient Instructions (Addendum)
I'm glad to hear that you are improving.  Form here forward you should continue to improve. Ok to wean off albuterol to only as needed at this point. If wheezing, cough or short of breath - can use albuterol as needed. If you continue to need albuterol after next week - follow up to discuss further treatment if needed.   If urinary symptoms are not improving in the next 24 to 48 hours, would recommend evaluation for possible urine testing and possible prostate testing at that time.  If any worsening symptoms, or unable to urinate, be seen right away as we discussed.  Return to the clinic or go to the nearest emergency room if any of your symptoms acutely worsen or new symptoms occur.

## 2018-10-17 ENCOUNTER — Telehealth: Payer: Self-pay

## 2018-10-17 NOTE — Telephone Encounter (Signed)
Pt would like a return to work note.  States he has not had symptoms/fever for at least three days.  Would like to RTW tomorrow.  Please send note to MyChart account.

## 2018-10-18 ENCOUNTER — Telehealth: Payer: Self-pay | Admitting: Family Medicine

## 2018-10-18 NOTE — Telephone Encounter (Signed)
Patient states he needs RTW note immediately.  Would like for the letter to be sent to [place].

## 2018-10-18 NOTE — Telephone Encounter (Signed)
Note sent to mychart.

## 2018-11-07 ENCOUNTER — Other Ambulatory Visit: Payer: Self-pay | Admitting: Family Medicine

## 2018-11-07 DIAGNOSIS — I10 Essential (primary) hypertension: Secondary | ICD-10-CM

## 2018-11-20 ENCOUNTER — Other Ambulatory Visit: Payer: Self-pay | Admitting: Family Medicine

## 2018-11-20 DIAGNOSIS — I10 Essential (primary) hypertension: Secondary | ICD-10-CM

## 2018-11-21 ENCOUNTER — Telehealth: Payer: Self-pay | Admitting: *Deleted

## 2018-11-21 NOTE — Telephone Encounter (Signed)
LVMTCB

## 2018-11-21 NOTE — Telephone Encounter (Signed)
Requested medication (s) are due for refill today: yes  Requested medication (s) are on the active medication list: yes  Last refill:  11/07/2018  Future visit scheduled: no  Notes to clinic: patient requesting 90 day supply   Requested Prescriptions  Pending Prescriptions Disp Refills   lisinopril (ZESTRIL) 20 MG tablet [Pharmacy Med Name: LISINOPRIL 20 MG TABLET] 90 tablet 1    Sig: TAKE 1 TABLET BY MOUTH EVERY DAY     Cardiovascular:  ACE Inhibitors Failed - 11/20/2018  2:17 PM      Failed - Cr in normal range and within 180 days    Creat  Date Value Ref Range Status  02/21/2015 1.28 (H) 0.70 - 1.25 mg/dL Final   Creatinine, Ser  Date Value Ref Range Status  03/21/2018 1.30 (H) 0.76 - 1.27 mg/dL Final         Failed - K in normal range and within 180 days    Potassium  Date Value Ref Range Status  03/21/2018 3.9 3.5 - 5.2 mmol/L Final         Passed - Patient is not pregnant      Passed - Last BP in normal range    BP Readings from Last 1 Encounters:  09/30/18 127/84         Passed - Valid encounter within last 6 months    Recent Outpatient Visits          1 month ago COVID-19   [facility], Ranell Patrick, MD   1 month ago Cough   [facility], Ranell Patrick, MD   1 month ago Cough   [facility], Ranell Patrick, MD   2 months ago Close Exposure to Covid-19 Virus   [facility], Ranell Patrick, MD   8 months ago Annual physical exam   [facility], Ranell Patrick, MD

## 2018-11-21 NOTE — Telephone Encounter (Signed)
Please schedule an appointment for patient with Dr. Carlota Raspberry for f/u meds for BP  Advise patient prescription was sent in but will need to be seen for any additional refills.  Thank You

## 2018-11-23 ENCOUNTER — Encounter: Payer: Self-pay | Admitting: Family Medicine

## 2018-11-23 ENCOUNTER — Ambulatory Visit (INDEPENDENT_AMBULATORY_CARE_PROVIDER_SITE_OTHER): Payer: Medicare HMO | Admitting: Family Medicine

## 2018-11-23 ENCOUNTER — Other Ambulatory Visit: Payer: Self-pay

## 2018-11-23 VITALS — BP 142/82 | HR 77 | Temp 98.5°F | Wt 186.0 lb

## 2018-11-23 DIAGNOSIS — N390 Urinary tract infection, site not specified: Secondary | ICD-10-CM | POA: Diagnosis not present

## 2018-11-23 DIAGNOSIS — M109 Gout, unspecified: Secondary | ICD-10-CM

## 2018-11-23 DIAGNOSIS — R319 Hematuria, unspecified: Secondary | ICD-10-CM | POA: Diagnosis not present

## 2018-11-23 DIAGNOSIS — R3911 Hesitancy of micturition: Secondary | ICD-10-CM

## 2018-11-23 DIAGNOSIS — Z87898 Personal history of other specified conditions: Secondary | ICD-10-CM

## 2018-11-23 DIAGNOSIS — I10 Essential (primary) hypertension: Secondary | ICD-10-CM | POA: Diagnosis not present

## 2018-11-23 LAB — POCT URINALYSIS DIP (MANUAL ENTRY)
Bilirubin, UA: NEGATIVE
Glucose, UA: NEGATIVE mg/dL
Ketones, POC UA: NEGATIVE mg/dL
Nitrite, UA: NEGATIVE
Protein Ur, POC: 30 mg/dL — AB
Spec Grav, UA: 1.025 (ref 1.010–1.025)
Urobilinogen, UA: 0.2 E.U./dL
pH, UA: 6 (ref 5.0–8.0)

## 2018-11-23 LAB — POC MICROSCOPIC URINALYSIS (UMFC): Mucus: ABSENT

## 2018-11-23 MED ORDER — LISINOPRIL 20 MG PO TABS: 20.0000 mg | ORAL_TABLET | Freq: Every day | ORAL | 1 refills | Status: DC

## 2018-11-23 MED ORDER — CIPROFLOXACIN HCL 500 MG PO TABS: 500.0000 mg | ORAL_TABLET | Freq: Two times a day (BID) | ORAL | 0 refills | Status: DC

## 2018-11-23 MED ORDER — ALLOPURINOL 100 MG PO TABS: 100.0000 mg | ORAL_TABLET | Freq: Every day | ORAL | 1 refills | Status: DC

## 2018-11-23 MED ORDER — AMLODIPINE BESYLATE 5 MG PO TABS: 5.0000 mg | ORAL_TABLET | Freq: Every day | ORAL | 1 refills | Status: DC

## 2018-11-23 MED ORDER — DOXAZOSIN MESYLATE 1 MG PO TABS: 1.0000 mg | ORAL_TABLET | Freq: Every day | ORAL | 1 refills | Status: DC

## 2018-11-23 NOTE — Patient Instructions (Addendum)
   Start new medication that can help with both of the symptoms as well as blood pressure.  Recheck in the next 6 weeks.  If there are any concerns on your urine test I will let you know.  If any worsening burning, abdominal pain, or new/changing symptoms return sooner.  No other med changes for now.   Return to the clinic or go to the nearest emergency room if any of your symptoms worsen or new symptoms occur.    If you have lab work done today you will be contacted with your lab results within the next 2 weeks.  If you have not heard from [place] then please contact us. The fastest way to get your results is to register for My Chart.   IF you received an x-ray today, you will receive an invoice from [facility]. Please contact [facility] at [phone] with questions or concerns regarding your invoice.   IF you received labwork today, you will receive an invoice from [facility]. Please contact [facility] at [phone] with questions or concerns regarding your invoice.   Our billing staff will not be able to assist you with questions regarding bills from these companies.  You will be contacted with the lab results as soon as they are available. The fastest way to get your results is to activate your My Chart account. Instructions are located on the last page of this paperwork. If you have not heard from [place] regarding the results in 2 weeks, please contact this office.

## 2018-11-23 NOTE — Progress Notes (Signed)
Subjective:    Patient ID: Matthew Simon, male    DOB: 1950-05-06, 68 y.o.   MRN: [id number]  HPI Matthew Simon is a 68 y.o. male Presents today for: Chief Complaint  Patient presents with  . chronic medical condition     Here for f/u on chronic  medical condition and med check. Patient stated on last visit he spoke with you about the prostate which seems off and would like to discuss this further today   Prior covid, symptoms resolved. Feels ok now.   Hypertension: BP Readings from Last 3 Encounters:  11/23/18 (!) 142/82  09/30/18 127/84  03/29/18 (!) 147/81   Lab Results  Component Value Date   CREATININE 1.30 (H) 03/21/2018  lisinopril 20mg  qd, norvasc 5mg  qd.  Creat range 1.22-1.55 since 2017. Most recent GFR 65 No missed doses.  Home readings - none.  On ASA occasional   Gout: Last flare: unknown, no recent flare.  Daily meds:allopurinol 100mg  QD. No new side effects.  Prn med: none.  Lab Results  Component Value Date   LABURIC 5.1 03/21/2018    Possible BPH: PSA 1.5 in 2018, 1.3 in February of this year. Some urinary hesitancy at that time. Reassuring in office urinalysis 03/21/18. Some times of slow flow, occasional stinging, or feeling of having to go to the bathroom. No retention, but some hesitancy.  Nocturia - 1 per night.  No hematuria, fever, night sweats or weight loss. No penile d/c.  Has tried otc supplement urozonic past few weeks.  AUA/IPSS score: 15  Hyperlipidemia:  Lab Results  Component Value Date   CHOL 204 (H) 03/21/2018   HDL 82 03/21/2018   LDLCALC 97 03/21/2018   TRIG 125 03/21/2018   CHOLHDL 2.5 03/21/2018   Lab Results  Component Value Date   ALT 13 03/21/2018   AST 21 03/21/2018   ALKPHOS 51 03/21/2018   BILITOT 0.5 03/21/2018  LDL 127 last year.     Patient Active Problem List   Diagnosis Date Noted  . Soft tissue mass 05/22/2017  . Gout 05/05/2016  . Hypertension 04/10/2011  . Colon polyp 04/10/2011   Past  Medical History:  Diagnosis Date  . Gout   . Hypertension    Past Surgical History:  Procedure Laterality Date  . COLONOSCOPY    . EXCISION MASS NECK N/A 05/24/2017   Procedure: EXCISION POSTERIOR NECK MASS;  Surgeon: Armandina Gemma, MD;  Location: [place] [facility];  Service: General;  Laterality: N/A;  . WISDOM TOOTH EXTRACTION     No Known Allergies Prior to Admission medications   Medication Sig Start Date End Date Taking? Authorizing Provider  allopurinol (ZYLOPRIM) 100 MG tablet TAKE 1 TABLET BY MOUTH EVERY DAY 09/26/18  Yes Wendie Agreste, MD  amLODipine (NORVASC) 5 MG tablet TAKE 1 TABLET BY MOUTH EVERY DAY 09/26/18  Yes Wendie Agreste, MD  lisinopril (ZESTRIL) 20 MG tablet TAKE 1 TABLET BY MOUTH EVERY DAY 11/21/18  Yes Wendie Agreste, MD   Social History   Socioeconomic History  . Marital status: Married    Spouse name: Not on file  . Number of children: Not on file  . Years of education: Not on file  . Highest education level: Not on file  Occupational History  . Occupation: business development  Social Needs  . Financial resource strain: Not on file  . Food insecurity    Worry: Not on file    Inability: Not on file  . Transportation  needs    Medical: Not on file    Non-medical: Not on file  Tobacco Use  . Smoking status: Former Smoker    Quit date: 05/10/1964    Years since quitting: 54.5  . Smokeless tobacco: Never Used  Substance and Sexual Activity  . Alcohol use: Yes    Alcohol/week: 21.0 standard drinks    Types: 21 Standard drinks or equivalent per week    Comment: wine with dinner  . Drug use: No  . Sexual activity: Not on file  Lifestyle  . Physical activity    Days per week: Not on file    Minutes per session: Not on file  . Stress: Not on file  Relationships  . Social Herbalist on phone: Not on file    Gets together: Not on file    Attends religious service: Not on file    Active member of club or organization: Not on file    Attends  meetings of clubs or organizations: Not on file    Relationship status: Not on file  . Intimate partner violence    Fear of current or ex partner: Not on file    Emotionally abused: Not on file    Physically abused: Not on file    Forced sexual activity: Not on file  Other Topics Concern  . Not on file  Social History Narrative  . Not on file    Review of Systems     Objective:   Physical Exam Vitals signs reviewed.  Constitutional:      Appearance: He is well-developed.  HENT:     Head: Normocephalic and atraumatic.  Eyes:     Pupils: Pupils are equal, round, and reactive to light.  Neck:     Vascular: No carotid bruit or JVD.  Cardiovascular:     Rate and Rhythm: Normal rate and regular rhythm.     Heart sounds: Normal heart sounds. No murmur.  Pulmonary:     Effort: Pulmonary effort is normal.     Breath sounds: Normal breath sounds. No rales.  Abdominal:     General: Abdomen is flat.     Tenderness: There is no abdominal tenderness.     Hernia: A hernia (umbilical, reducible, nt. ) is present.  Skin:    General: Skin is warm and dry.  Neurological:     Mental Status: He is alert and oriented to person, place, and time.  Psychiatric:        Mood and Affect: Mood normal.        Behavior: Behavior normal.     Results for orders placed or performed in visit on 11/23/18  POCT urinalysis dipstick  Result Value Ref Range   Color, UA yellow yellow   Clarity, UA clear clear   Glucose, UA negative negative mg/dL   Bilirubin, UA negative negative   Ketones, POC UA negative negative mg/dL   Spec Grav, UA 1.025 1.010 - 1.025   Blood, UA trace-lysed (A) negative   pH, UA 6.0 5.0 - 8.0   Protein Ur, POC =30 (A) negative mg/dL   Urobilinogen, UA 0.2 0.2 or 1.0 E.U./dL   Nitrite, UA Negative Negative   Leukocytes, UA Trace (A) Negative  POCT Microscopic Urinalysis ([facility])  Result Value Ref Range   WBC,UR,HPF,POC Moderate (A) None WBC/hpf   RBC,UR,HPF,POC Few (A)  None RBC/hpf   Bacteria None None, Too numerous to count   Mucus Absent Absent   Epithelial Cells, UR Per  Microscopy Few (A) None, Too numerous to count cells/hpf        Assessment & Plan:   Matthew Simon is a 68 y.o. male Urinary hesitancy - Plan: POCT urinalysis dipstick, POCT Microscopic Urinalysis ([facility]), PSA, doxazosin (CARDURA) 1 MG tablet History of dysuria - Plan: POCT urinalysis dipstick, POCT Microscopic Urinalysis ([facility]), PSA  -Intermittent symptoms, thought to be component of BPH but with dysuria and leukocytes, moderate RBC on urinalysis we will treat for possible prostatitis/urethritis.  Start Cipro, potential side effects and tendinopathy risks discussed.check urine culture.   -We will also start doxazosin for possible BPH component which should also help with BP control.  Recheck next 6 weeks unless any persistent urinary symptoms or worsening.  Gout, unspecified cause, unspecified chronicity, unspecified site - Plan: allopurinol (ZYLOPRIM) 100 MG tablet  -Stable, continue allopurinol.  Plan for uric acid testing once per year has stable recently  Essential hypertension - Plan: Comprehensive metabolic panel, doxazosin (CARDURA) 1 MG tablet, amLODipine (NORVASC) 5 MG tablet, lisinopril (ZESTRIL) 20 MG tablet  -Decreased control, addition of Cardura should help, monitor with repeat evaluation in 6 weeks.  Meds ordered this encounter  Medications  . doxazosin (CARDURA) 1 MG tablet    Sig: Take 1 tablet (1 mg total) by mouth daily.    Dispense:  90 tablet    Refill:  1  . allopurinol (ZYLOPRIM) 100 MG tablet    Sig: Take 1 tablet (100 mg total) by mouth daily.    Dispense:  90 tablet    Refill:  1  . amLODipine (NORVASC) 5 MG tablet    Sig: Take 1 tablet (5 mg total) by mouth daily.    Dispense:  90 tablet    Refill:  1  . lisinopril (ZESTRIL) 20 MG tablet    Sig: Take 1 tablet (20 mg total) by mouth daily.    Dispense:  90 tablet    Refill:  1   Patient  Instructions     Start new medication that can help with both of the symptoms as well as blood pressure.  Recheck in the next 6 weeks.  If there are any concerns on your urine test I will let you know.  If any worsening burning, abdominal pain, or new/changing symptoms return sooner.  No other med changes for now.   Return to the clinic or go to the nearest emergency room if any of your symptoms worsen or new symptoms occur.    If you have lab work done today you will be contacted with your lab results within the next 2 weeks.  If you have not heard from [place] then please contact us. The fastest way to get your results is to register for My Chart.   IF you received an x-ray today, you will receive an invoice from [facility]. Please contact [facility] at [phone] with questions or concerns regarding your invoice.   IF you received labwork today, you will receive an invoice from [place] [facility]. Please contact [facility] at [phone] with questions or concerns regarding your invoice.   Our billing staff will not be able to assist you with questions regarding bills from these companies.  You will be contacted with the lab results as soon as they are available. The fastest way to get your results is to activate your My Chart account. Instructions are located on the last page of this paperwork. If you have not heard from [place] regarding the results in 2 weeks, please contact this office.  Signed,   Merri Ray, MD [facility].  11/23/18 10:36 AM

## 2018-11-24 LAB — COMPREHENSIVE METABOLIC PANEL
ALT: 15 IU/L (ref 0–44)
AST: 24 IU/L (ref 0–40)
Albumin/Globulin Ratio: 1.9 (ref 1.2–2.2)
Albumin: 4.7 g/dL (ref 3.8–4.8)
Alkaline Phosphatase: 51 IU/L (ref 39–117)
BUN/Creatinine Ratio: 12 (ref 10–24)
BUN: 15 mg/dL (ref 8–27)
Bilirubin Total: 0.4 mg/dL (ref 0.0–1.2)
CO2: 24 mmol/L (ref 20–29)
Calcium: 9.8 mg/dL (ref 8.6–10.2)
Chloride: 104 mmol/L (ref 96–106)
Creatinine, Ser: 1.26 mg/dL (ref 0.76–1.27)
GFR calc Af Amer: 67 mL/min/{1.73_m2} (ref >59)
GFR calc non Af Amer: 58 mL/min/{1.73_m2} — ABNORMAL LOW (ref >59)
Globulin, Total: 2.5 g/dL (ref 1.5–4.5)
Glucose: 90 mg/dL (ref 65–99)
Potassium: 4.2 mmol/L (ref 3.5–5.2)
Sodium: 143 mmol/L (ref 134–144)
Total Protein: 7.2 g/dL (ref 6.0–8.5)

## 2018-11-24 LAB — PSA: Prostate Specific Ag, Serum: 1.6 ng/mL (ref 0.0–4.0)

## 2018-11-25 LAB — URINE CULTURE

## 2018-12-12 ENCOUNTER — Telehealth: Payer: Self-pay | Admitting: Family Medicine

## 2018-12-12 DIAGNOSIS — R3911 Hesitancy of micturition: Secondary | ICD-10-CM

## 2018-12-12 DIAGNOSIS — R300 Dysuria: Secondary | ICD-10-CM

## 2018-12-12 NOTE — Telephone Encounter (Signed)
Pt would like a referral for a urologist. He would also like a refill on his ciprofloxacin (CIPRO) 500 MG tablet [id number]. He says he is still having inflammation in his prostate area. Pharmacy:  [id number] - [place], [place] - [place]. Please advise at [phone].

## 2018-12-13 MED ORDER — CIPROFLOXACIN HCL 500 MG PO TABS: 500.0000 mg | ORAL_TABLET | Freq: Two times a day (BID) | ORAL | 0 refills | Status: DC

## 2018-12-13 NOTE — Telephone Encounter (Signed)
Ok will call pt and advise, thanks

## 2018-12-13 NOTE — Telephone Encounter (Signed)
Spoke with pt and he states he still has the same symptoms from his last OV and would like a referral sent, is it ok for me to send a referral or would you like me to schdule him a follow-up appointment with you? Please advise

## 2018-12-13 NOTE — Telephone Encounter (Signed)
See last visit and prostate test.  I am happy to refer him to urology, will extend Cipro for 10 days, but if not significant improvement in symptoms or any acute worsening symptoms such as fever or abdominal pain should be seen right away.

## 2019-01-04 ENCOUNTER — Telehealth: Payer: Self-pay | Admitting: Family Medicine

## 2019-01-04 ENCOUNTER — Ambulatory Visit (INDEPENDENT_AMBULATORY_CARE_PROVIDER_SITE_OTHER): Payer: Medicare HMO | Admitting: Family Medicine

## 2019-01-04 ENCOUNTER — Encounter: Payer: Self-pay | Admitting: Family Medicine

## 2019-01-04 ENCOUNTER — Other Ambulatory Visit: Payer: Self-pay

## 2019-01-04 VITALS — BP 142/80 | HR 73 | Temp 98.2°F | Wt 192.6 lb

## 2019-01-04 DIAGNOSIS — R300 Dysuria: Secondary | ICD-10-CM

## 2019-01-04 DIAGNOSIS — R3911 Hesitancy of micturition: Secondary | ICD-10-CM | POA: Diagnosis not present

## 2019-01-04 DIAGNOSIS — I10 Essential (primary) hypertension: Secondary | ICD-10-CM | POA: Diagnosis not present

## 2019-01-04 LAB — POC MICROSCOPIC URINALYSIS (UMFC): Mucus: ABSENT

## 2019-01-04 LAB — POCT URINALYSIS DIP (MANUAL ENTRY)
Bilirubin, UA: NEGATIVE
Glucose, UA: NEGATIVE mg/dL
Ketones, POC UA: NEGATIVE mg/dL
Leukocytes, UA: NEGATIVE
Nitrite, UA: NEGATIVE
Protein Ur, POC: 30 mg/dL — AB
Spec Grav, UA: 1.02 (ref 1.010–1.025)
Urobilinogen, UA: 0.2 E.U./dL
pH, UA: 5.5 (ref 5.0–8.0)

## 2019-01-04 MED ORDER — DOXAZOSIN MESYLATE 2 MG PO TABS: 2.0000 mg | ORAL_TABLET | Freq: Every day | ORAL | 1 refills | Status: DC

## 2019-01-04 NOTE — Progress Notes (Signed)
Subjective:  Patient ID: Matthew Simon, male    DOB: May 31, 1950  Age: 68 y.o. MRN: [id number]  CC:  Chief Complaint  Patient presents with  . Follow-up    6 week f/u on prostate and blood pressure. Still having issues with prostate. Not able to get in to see urology until 12/21     HPI Matthew Simon presents for   Urinary hesitancy/dysuria See prior visits.  Intermittent symptoms when discussed in October.  Thought to be component of BPH but also noted to have dysuria and leukocytes previously, moderate RBC on urinalysis.  Treated for prostatitis/urethritis with Cipro and doxazosin for possible BPH component. Recurrence of symptoms November 9, repeat Cipro course at 500 mg twice daily for 10 days and referral to [facility].  PSA 1.3 in February, 1.6 October 21st.  Urine culture from October 21 less than 10,000 CFU.  Noted improved urinary flow with cipro, urgency remained the same. Still some hesitancy as well. Appt with Urology was going to be 12/24 - did not schedule as wanted to be seen sooner.  No fever/abd pain/dysuria/hematuria.  Hypertension: Continued on amlodipine 5 mg, lisinopril 20 mg, and added doxazosin 1 mg daily due to decreased control October 21 visit. No new side effects.   Home readings: no recent readings.  BP Readings from Last 3 Encounters:  01/04/19 (!) 142/80  11/23/18 (!) 142/82  09/30/18 127/84   Lab Results  Component Value Date   CREATININE 1.26 11/23/2018     History Patient Active Problem List   Diagnosis Date Noted  . Soft tissue mass 05/22/2017  . Gout 05/05/2016  . Hypertension 04/10/2011  . Colon polyp 04/10/2011   Past Medical History:  Diagnosis Date  . Gout   . Hypertension    Past Surgical History:  Procedure Laterality Date  . COLONOSCOPY    . EXCISION MASS NECK N/A 05/24/2017   Procedure: EXCISION POSTERIOR NECK MASS;  Surgeon: Armandina Gemma, MD;  Location: [facility];  Service: General;  Laterality: N/A;  . WISDOM TOOTH  EXTRACTION     No Known Allergies Prior to Admission medications   Medication Sig Start Date End Date Taking? Authorizing Provider  allopurinol (ZYLOPRIM) 100 MG tablet Take 1 tablet (100 mg total) by mouth daily. 11/23/18   Wendie Agreste, MD  amLODipine (NORVASC) 5 MG tablet Take 1 tablet (5 mg total) by mouth daily. 11/23/18   Wendie Agreste, MD  ciprofloxacin (CIPRO) 500 MG tablet Take 1 tablet (500 mg total) by mouth 2 (two) times daily. 12/13/18   Wendie Agreste, MD  doxazosin (CARDURA) 1 MG tablet Take 1 tablet (1 mg total) by mouth daily. 11/23/18   Wendie Agreste, MD  lisinopril (ZESTRIL) 20 MG tablet Take 1 tablet (20 mg total) by mouth daily. 11/23/18   Wendie Agreste, MD   Social History   Socioeconomic History  . Marital status: Married    Spouse name: Not on file  . Number of children: Not on file  . Years of education: Not on file  . Highest education level: Not on file  Occupational History  . Occupation: business development  Social Needs  . Financial resource strain: Not on file  . Food insecurity    Worry: Not on file    Inability: Not on file  . Transportation needs    Medical: Not on file    Non-medical: Not on file  Tobacco Use  . Smoking status: Former Smoker  Quit date: 05/10/1964    Years since quitting: 54.6  . Smokeless tobacco: Never Used  Substance and Sexual Activity  . Alcohol use: Yes    Alcohol/week: 21.0 standard drinks    Types: 21 Standard drinks or equivalent per week    Comment: wine with dinner  . Drug use: No  . Sexual activity: Not on file  Lifestyle  . Physical activity    Days per week: Not on file    Minutes per session: Not on file  . Stress: Not on file  Relationships  . Social Herbalist on phone: Not on file    Gets together: Not on file    Attends religious service: Not on file    Active member of club or organization: Not on file    Attends meetings of clubs or organizations: Not on file     Relationship status: Not on file  . Intimate partner violence    Fear of current or ex partner: Not on file    Emotionally abused: Not on file    Physically abused: Not on file    Forced sexual activity: Not on file  Other Topics Concern  . Not on file  Social History Narrative  . Not on file    Review of Systems  Constitutional: Negative for fatigue and unexpected weight change.  Eyes: Negative for visual disturbance.  Respiratory: Negative for cough, chest tightness and shortness of breath.   Cardiovascular: Negative for chest pain, palpitations and leg swelling.  Gastrointestinal: Negative for abdominal pain and blood in stool.  Neurological: Negative for dizziness, light-headedness and headaches.   Other Per HPI   Objective:   Vitals:   01/04/19 0849 01/04/19 0851  BP: (!) 148/83 (!) 142/80  Pulse: 73   Temp: 98.2 F (36.8 C)   TempSrc: Oral   SpO2: 97%   Weight: 192 lb 9.6 oz (87.4 kg)      Physical Exam Vitals signs reviewed.  Constitutional:      Appearance: He is well-developed.  HENT:     Head: Normocephalic and atraumatic.  Eyes:     Pupils: Pupils are equal, round, and reactive to light.  Neck:     Vascular: No carotid bruit or JVD.  Cardiovascular:     Rate and Rhythm: Normal rate and regular rhythm.     Heart sounds: Normal heart sounds. No murmur.  Pulmonary:     Effort: Pulmonary effort is normal.     Breath sounds: Normal breath sounds. No rales.  Abdominal:     Tenderness: There is no abdominal tenderness. There is no guarding.  Skin:    General: Skin is warm and dry.  Neurological:     Mental Status: He is alert and oriented to person, place, and time.     Results for orders placed or performed in visit on 01/04/19  POCT urinalysis dipstick  Result Value Ref Range   Color, UA yellow yellow   Clarity, UA clear clear   Glucose, UA negative negative mg/dL   Bilirubin, UA negative negative   Ketones, POC UA negative negative mg/dL    Spec Grav, UA 1.020 1.010 - 1.025   Blood, UA trace-lysed (A) negative   pH, UA 5.5 5.0 - 8.0   Protein Ur, POC =30 (A) negative mg/dL   Urobilinogen, UA 0.2 0.2 or 1.0 E.U./dL   Nitrite, UA Negative Negative   Leukocytes, UA Negative Negative  POCT Microscopic Urinalysis ([facility])  Result Value Ref Range  WBC,UR,HPF,POC Few (A) None WBC/hpf   RBC,UR,HPF,POC None None RBC/hpf   Bacteria None None, Too numerous to count   Mucus Absent Absent   Epithelial Cells, UR Per Microscopy Moderate (A) None, Too numerous to count cells/hpf      Assessment & Plan:  Matthew Simon is a 68 y.o. male . Urinary hesitancy - Plan: POCT urinalysis dipstick, POCT Microscopic Urinalysis ([facility]) Dysuria - Plan: doxazosin (CARDURA) 2 MG tablet, POCT urinalysis dipstick, POCT Microscopic Urinalysis ([facility])  -Slight improvement with Cipro, minimal change with second dose.  May have actually had some improvement with doxazosin.  PSA reassuring and urine culture reassuring previously.  Few WBCs but moderate epithelial cells on micro, negative nitrite/LE on in office testing today  -Increase doxazosin to 2 mg daily, continue plan to follow-up with urology but if not having improvement in symptoms in the next week, can check to see if earlier appointment available.  Essential hypertension - Plan: doxazosin (CARDURA) 2 MG tablet  -Still borderline, increase doxazosin as above.  Meds ordered this encounter  Medications  . doxazosin (CARDURA) 2 MG tablet    Sig: Take 1 tablet (2 mg total) by mouth daily.    Dispense:  90 tablet    Refill:  1   Patient Instructions    Try higher dose of doxazosin for both blood pressure and urinary symptoms. If urinary symptoms are not improving into next week, let me know and I will see if I can get you seen by urology sooner.   Thanks for coming in today.    If you have lab work done today you will be contacted with your lab results within the next 2 weeks.  If you have not  heard from [place] then please contact us. The fastest way to get your results is to register for My Chart.   IF you received an x-ray today, you will receive an invoice from [facility]. Please contact [facility] at [phone] with questions or concerns regarding your invoice.   IF you received labwork today, you will receive an invoice from [place]. Please contact [facility] at [phone] with questions or concerns regarding your invoice.   Our billing staff will not be able to assist you with questions regarding bills from these companies.  You will be contacted with the lab results as soon as they are available. The fastest way to get your results is to activate your My Chart account. Instructions are located on the last page of this paperwork. If you have not heard from [place] regarding the results in 2 weeks, please contact this office.          Signed, Merri Ray, MD [facility]

## 2019-01-04 NOTE — Telephone Encounter (Signed)
Noted.   FYI to provider  

## 2019-01-04 NOTE — Telephone Encounter (Signed)
Pt called in to let Dr. Carlota Raspberry know that he is scheduled with [facility] 02/07/2018 with Dr, Elie Goody  At 8:40 FR

## 2019-01-04 NOTE — Patient Instructions (Addendum)
  Try higher dose of doxazosin for both blood pressure and urinary symptoms. If urinary symptoms are not improving into next week, let me know and I will see if I can get you seen by urology sooner.   Thanks for coming in today.    If you have lab work done today you will be contacted with your lab results within the next 2 weeks.  If you have not heard from [place] then please contact us. The fastest way to get your results is to register for My Chart.   IF you received an x-ray today, you will receive an invoice from [facility]. Please contact [facility] at [phone] with questions or concerns regarding your invoice.   IF you received labwork today, you will receive an invoice from [place]. Please contact [facility] at [phone] with questions or concerns regarding your invoice.   Our billing staff will not be able to assist you with questions regarding bills from these companies.  You will be contacted with the lab results as soon as they are available. The fastest way to get your results is to activate your My Chart account. Instructions are located on the last page of this paperwork. If you have not heard from [place] regarding the results in 2 weeks, please contact this office.

## 2019-02-08 DIAGNOSIS — R3911 Hesitancy of micturition: Secondary | ICD-10-CM | POA: Diagnosis not present

## 2019-02-08 DIAGNOSIS — N401 Enlarged prostate with lower urinary tract symptoms: Secondary | ICD-10-CM | POA: Diagnosis not present

## 2019-02-08 DIAGNOSIS — R3121 Asymptomatic microscopic hematuria: Secondary | ICD-10-CM | POA: Diagnosis not present

## 2019-02-24 DIAGNOSIS — R3121 Asymptomatic microscopic hematuria: Secondary | ICD-10-CM | POA: Diagnosis not present

## 2019-02-24 DIAGNOSIS — N200 Calculus of kidney: Secondary | ICD-10-CM | POA: Diagnosis not present

## 2019-02-24 DIAGNOSIS — R3129 Other microscopic hematuria: Secondary | ICD-10-CM | POA: Diagnosis not present

## 2019-03-10 ENCOUNTER — Other Ambulatory Visit: Payer: Self-pay | Admitting: Family Medicine

## 2019-03-10 NOTE — Telephone Encounter (Signed)
Requested medication (s) are due for refill today: no  Requested medication (s) are on the active medication list: no  Last refill:  last filled in 09/2018 by a different provider.   Future visit scheduled: no  Notes to clinic: Medication not on current med list. Previously ordered by another provider    Requested Prescriptions  Pending Prescriptions Disp Refills   albuterol (VENTOLIN HFA) 108 (90 Base) MCG/ACT inhaler [Pharmacy Med Name: ALBUTEROL HFA (VENTOLIN) INH]      Sig: Inhale 1-2 puffs into the lungs every 6 (six) hours as needed for wheezing or shortness of breath.      Pulmonology:  Beta Agonists Failed - 03/10/2019  2:28 PM      Failed - One inhaler should last at least one month. If the patient is requesting refills earlier, contact the patient to check for uncontrolled symptoms.      Passed - Valid encounter within last 12 months    Recent Outpatient Visits           2 months ago Urinary hesitancy   [facility], Ranell Patrick, MD   3 months ago Urinary hesitancy   [facility], Ranell Patrick, MD   5 months ago COVID-19   [facility], Ranell Patrick, MD   5 months ago Cough   [facility], Ranell Patrick, MD   5 months ago Cough   [facility], Ranell Patrick, MD

## 2019-03-13 DIAGNOSIS — R3911 Hesitancy of micturition: Secondary | ICD-10-CM | POA: Diagnosis not present

## 2019-03-13 DIAGNOSIS — N401 Enlarged prostate with lower urinary tract symptoms: Secondary | ICD-10-CM | POA: Diagnosis not present

## 2019-03-13 DIAGNOSIS — K8689 Other specified diseases of pancreas: Secondary | ICD-10-CM | POA: Diagnosis not present

## 2019-03-13 DIAGNOSIS — N329 Bladder disorder, unspecified: Secondary | ICD-10-CM | POA: Diagnosis not present

## 2019-03-31 ENCOUNTER — Other Ambulatory Visit: Payer: Self-pay

## 2019-03-31 DIAGNOSIS — N3289 Other specified disorders of bladder: Secondary | ICD-10-CM | POA: Diagnosis not present

## 2019-03-31 DIAGNOSIS — N3080 Other cystitis without hematuria: Secondary | ICD-10-CM | POA: Diagnosis not present

## 2019-04-03 ENCOUNTER — Telehealth: Payer: Self-pay | Admitting: Gastroenterology

## 2019-04-03 NOTE — Telephone Encounter (Signed)
Called patient back, and patient states he had some kind of test done at [facility] ( he wasn't sure what) and it show something with his pancreas that Dr Junious Silk  suggested he follow-up with his G.I. Dr. Parks Ranger office does not have Epic, I asked him to have them fax the report to [phone] to Dr. Doyne Keel attention. Scheduled for first available appt. with Dr. Havery Moros on 05/17/19. Patient did not want to see a PA or NP.

## 2019-04-03 NOTE — Telephone Encounter (Signed)
Patient calling- he states that his doctor Dr. Junious Silk wanted him to come into be seen by Dr. Havery Moros. States that he had an X-Ray done and there was concerns with the patients pancreas and he wanted him to see his GI doctor. Patient is scheduled with Dr. Havery Moros on 4/14 (that was the soonest he had and he was not interested in seeing a PA.) Patient is requesting to speak with Dr. Ozella Rocks nurse.

## 2019-04-03 NOTE — Telephone Encounter (Signed)
Thanks Customer service manager. Yes I don't see any imaging on file in Epic. If he can get that report to [place] I will review it and make some recommendations while we await his clinic appointment. He may likely need some some follow up imaging

## 2019-04-06 NOTE — Telephone Encounter (Signed)
CT report received through [facility].  Report has been printed and sent to Dr. Havery Moros for review.

## 2019-04-12 ENCOUNTER — Telehealth: Payer: Self-pay | Admitting: Gastroenterology

## 2019-04-12 NOTE — Telephone Encounter (Signed)
I received CT scan of the abdomen pelvis from Dr. Festus Aloe from urology, dated 02/24/19 -this was done to evaluate hematuria.  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  No obvious mass of the pancreas noted.  I discussed his case with my advanced endoscopy colleagues regarding MRI first versus EUS.  Their preference is to start with MRI/MRCP to further evaluate this.  I know he is not scheduled to see me in the clinic till April but I would like to order the MRI for him now.  Sherlynn Stalls can you please let him know I reviewed his CT scan and I think he warrants MRI pancreas/MRCP if you can help coordinate this for him.  This will give [place] better images of his pancreas to determine if we need to pursue any further work-up such as EUS etc.  Thanks

## 2019-04-13 ENCOUNTER — Other Ambulatory Visit: Payer: Self-pay

## 2019-04-13 DIAGNOSIS — R935 Abnormal findings on diagnostic imaging of other abdominal regions, including retroperitoneum: Secondary | ICD-10-CM

## 2019-04-13 NOTE — Telephone Encounter (Signed)
Called patient and let him know Dr. Havery Moros has reviewed his CT from Dr. Junious Silk, and there is area in the pancreas that he would like to evaluate further with a MRI pancreas/MRCP. Patient agreed. Scheduled this at [facility] on 04/25/19, patient to arrive at 7:30am and be NPO after 4am. Also patient needs a current creatine, he will come to our lab before 04/25/19 for that

## 2019-04-17 NOTE — Telephone Encounter (Signed)
Pt requested a call back to discuss his upcoming appointments.

## 2019-04-17 NOTE — Telephone Encounter (Signed)
Returned patient's call, and he wanted to know if he needed someone to come with him for his MRI/MRCP and I told him no. He is not getting sedation for this

## 2019-04-20 ENCOUNTER — Other Ambulatory Visit (INDEPENDENT_AMBULATORY_CARE_PROVIDER_SITE_OTHER): Payer: Medicare HMO

## 2019-04-20 DIAGNOSIS — R935 Abnormal findings on diagnostic imaging of other abdominal regions, including retroperitoneum: Secondary | ICD-10-CM | POA: Diagnosis not present

## 2019-04-20 LAB — BASIC METABOLIC PANEL
BUN: 13 mg/dL (ref 6–23)
CO2: 29 mEq/L (ref 19–32)
Calcium: 9.7 mg/dL (ref 8.4–10.5)
Chloride: 104 mEq/L (ref 96–112)
Creatinine, Ser: 1.23 mg/dL (ref 0.40–1.50)
GFR: 70.44 mL/min (ref >60.00)
Glucose, Bld: 103 mg/dL — ABNORMAL HIGH (ref 70–99)
Potassium: 4 mEq/L (ref 3.5–5.1)
Sodium: 140 mEq/L (ref 135–145)

## 2019-04-21 ENCOUNTER — Telehealth: Payer: Self-pay | Admitting: Gastroenterology

## 2019-04-21 NOTE — Telephone Encounter (Signed)
Pt inquired about lab results completed 04/20/19.

## 2019-04-21 NOTE — Telephone Encounter (Signed)
Called patient and let him know his labs from yesterday have not been reviewed by the Dr. Yet. We will call him as soon as it has been

## 2019-04-24 ENCOUNTER — Telehealth: Payer: Self-pay | Admitting: Gastroenterology

## 2019-04-24 NOTE — Telephone Encounter (Signed)
Called patient back and reminded him of the time, place and NPO status for his MRI/MRCP tomorrow

## 2019-04-24 NOTE — Telephone Encounter (Signed)
Pt requested a call back to discuss appt at [facility] 03/28/19.

## 2019-04-25 ENCOUNTER — Ambulatory Visit (HOSPITAL_COMMUNITY)
Admission: RE | Admit: 2019-04-25 | Discharge: 2019-04-25 | Disposition: A | Discharge status: Home / Self Care | Payer: Medicare HMO | Source: Ambulatory Visit | Attending: Gastroenterology | Admitting: Gastroenterology

## 2019-04-25 ENCOUNTER — Other Ambulatory Visit: Payer: Self-pay | Admitting: Gastroenterology

## 2019-04-25 ENCOUNTER — Other Ambulatory Visit: Payer: Self-pay

## 2019-04-25 DIAGNOSIS — K8689 Other specified diseases of pancreas: Secondary | ICD-10-CM | POA: Diagnosis not present

## 2019-04-25 DIAGNOSIS — R935 Abnormal findings on diagnostic imaging of other abdominal regions, including retroperitoneum: Secondary | ICD-10-CM | POA: Diagnosis present

## 2019-04-25 DIAGNOSIS — N281 Cyst of kidney, acquired: Secondary | ICD-10-CM | POA: Diagnosis not present

## 2019-04-25 MED ORDER — GADOBUTROL 1 MMOL/ML IV SOLN
8.0000 mL | Freq: Once | INTRAVENOUS | Status: AC | PRN
  Administered 2019-04-25: 8 mL via INTRAVENOUS

## 2019-05-17 ENCOUNTER — Ambulatory Visit (INDEPENDENT_AMBULATORY_CARE_PROVIDER_SITE_OTHER): Payer: Medicare HMO | Admitting: Gastroenterology

## 2019-05-17 ENCOUNTER — Other Ambulatory Visit: Payer: Self-pay

## 2019-05-17 ENCOUNTER — Encounter: Payer: Self-pay | Admitting: Gastroenterology

## 2019-05-17 VITALS — BP 126/80 | HR 65 | Temp 98.1°F | Ht 70.0 in | Wt 193.1 lb

## 2019-05-17 DIAGNOSIS — R935 Abnormal findings on diagnostic imaging of other abdominal regions, including retroperitoneum: Secondary | ICD-10-CM

## 2019-05-17 DIAGNOSIS — Z86010 Personal history of colonic polyps: Secondary | ICD-10-CM | POA: Diagnosis not present

## 2019-05-17 NOTE — Patient Instructions (Addendum)
If you are age 70 or older, your body mass index should be between 23-30. Your Body mass index is 27.71 kg/m. If this is out of the aforementioned range listed, please consider follow up with your Primary Care Provider.  If you are age 35 or younger, your body mass index should be between 19-25. Your Body mass index is 27.71 kg/m. If this is out of the aformentioned range listed, please consider follow up with your Primary Care Provider.   You will be due for an MRI in September of 2022.   Thank you for entrusting me with your care and for choosing [facility], Dr. [place] Cellar

## 2019-05-17 NOTE — Progress Notes (Signed)
HPI :  69 year old male who I know from prior colonoscopy in 2019, was referred here by Dr. Festus Aloe from urology regarding an abnormal CT scan.  The patient had been experiencing hematuria and in January underwent a CT scan of the abdomen pelvis.  He was noted to have diffuse ductal dilation of the main pancreatic duct to 9 mm without biliary dilation.  There was no obvious mass lesion of the pancreas noted.  I discussed his case with my advanced endoscopy colleagues.  MRCP was recommended to further evaluate.  He had this done in March 21 as outlined below.  There was very mild dilation of the main pancreatic duct on this imaging modality to 4 mm.  There was no mass or cystic lesion noted in the pancreas.  He had bilateral renal cysts as well as a stable intermediate risk splenic lesion a few centimeters in size.  Radiologist had recommended follow-up imaging in 6 months.  Patient states he is feeling pretty well in general and denies any abdominal pains.  He is eating well.  No weight loss.  He denies any personal history of pancreatitis.  He denies any family history of pancreatitis or pancreatic cancer.  He previously had Covid and has since been vaccinated.    MRCP - 04/23/19 IMPRESSION: 1. Mild dilatation of the main pancreatic duct without obstructing lesion. Consider follow-up imaging in 6 months with repeat MR without and with contrast material to ensure stability of this likely benign abnormality. 2. Bilateral kidney cysts. 3. Stable splenic lesion compatible with intermediate imaging characteristics. Recommend followup imaging in 6 months to confirm stability. This recommendation follows ACR consensus guidelines: Management of Incidental Pancreatic Cysts: A White Paper of the ACR Incidental Findings Committee. J Am Coll Radiol [id number]. 4.  Aortic Atherosclerosis (ICD10-I70.0).   Colonoscopy 05/12/2017 - The perianal and digital rectal examinations were normal. -  A 3 mm polyp was found in the ascending colon. The polyp was sessile. The polyp was removed with a cold snare. Resection and retrieval were complete. - A 3 mm polyp was found in the transverse colon. The polyp was sessile. The polyp was removed with a cold snare. Resection and retrieval were complete. - Four sessile polyps were found in the transverse colon. The polyps were diminutive in size. These polyps were removed with a cold biopsy forceps. Resection and retrieval were complete. - Patchy linear areas of edematous mucosa was found in the transverse colon and in the ascending colon, potentially consistent with pneumotosis or fluid filled subepithelial cystic lesion. Bite on bite biopsies were taken with a cold forceps for histology. - Scattered medium-mouthed diverticula were found in the entire colon. - Internal hemorrhoids were found during retroflexion. - The exam was otherwise without abnormality.  Diagnosis 1. Surgical [P], transverse and ascending, polyp (5) - TUBULAR ADENOMA(S). - NEGATIVE FOR HIGH GRADE DYSPLASIA OR MALIGNANCY. 2. Surgical [P], transverse - COLONIC MUCOSA WITH NO SPECIFIC HISTOPATHOLOGIC CHANGES.  Repeat colonoscopy in 3 years   Past Medical History:  Diagnosis Date  . Gout   . Hypertension      Past Surgical History:  Procedure Laterality Date  . COLONOSCOPY    . EXCISION MASS NECK N/A 05/24/2017   Procedure: EXCISION POSTERIOR NECK MASS;  Surgeon: Armandina Gemma, MD;  Location: [facility] OR;  Service: General;  Laterality: N/A;  . WISDOM TOOTH EXTRACTION     Family History  Problem Relation Age of Onset  . Cancer Mother  lymphoscarcoma  . Stroke Father   . Colon cancer Neg Hx   . Rectal cancer Neg Hx    Social History   Tobacco Use  . Smoking status: Former Smoker    Quit date: 05/10/1964    Years since quitting: 55.0  . Smokeless tobacco: Never Used  Substance Use Topics  . Alcohol use: Yes    Alcohol/week: 21.0 standard drinks     Types: 21 Standard drinks or equivalent per week    Comment: wine with dinner  . Drug use: No   Current Outpatient Medications  Medication Sig Dispense Refill  . albuterol (VENTOLIN HFA) 108 (90 Base) MCG/ACT inhaler INHALE 1-2 PUFFS INTO THE LUNGS EVERY 6 (SIX) HOURS AS NEEDED FOR WHEEZING OR SHORTNESS OF BREATH. 18 g 1  . allopurinol (ZYLOPRIM) 100 MG tablet Take 1 tablet (100 mg total) by mouth daily. 90 tablet 1  . amLODipine (NORVASC) 5 MG tablet Take 1 tablet (5 mg total) by mouth daily. 90 tablet 1  . doxazosin (CARDURA) 2 MG tablet Take 1 tablet (2 mg total) by mouth daily. 90 tablet 1  . lisinopril (ZESTRIL) 20 MG tablet Take 1 tablet (20 mg total) by mouth daily. 90 tablet 1   Current Facility-Administered Medications  Medication Dose Route Frequency Provider Last Rate Last Admin  . 0.9 %  sodium chloride infusion  500 mL Intravenous Once Tomeka Kantner, Carlota Raspberry, MD       No Known Allergies   Review of Systems: All systems reviewed and negative except where noted in HPI.    MR 3D Recon At Scanner  Result Date: 04/25/2019 CLINICAL DATA:  Evaluate dilated pancreatic duct. EXAM: MRI ABDOMEN WITHOUT AND WITH CONTRAST (INCLUDING MRCP) TECHNIQUE: Multiplanar multisequence MR imaging of the abdomen was performed both before and after the administration of intravenous contrast. Heavily T2-weighted images of the biliary and pancreatic ducts were obtained, and three-dimensional MRCP images were rendered by post processing. CONTRAST:  26mL GADAVIST GADOBUTROL 1 MMOL/ML IV SOLN COMPARISON:  02/24/2019 FINDINGS: Lower chest: No acute findings. Hepatobiliary: No mass or other parenchymal abnormality identified. Gallbladder is unremarkable. No gallbladder wall inflammation or bile duct dilatation. Pancreas: No pancreatic inflammation identified. Mild ectasia of the main duct which measures up to 4 mm. No pancreatic mass identified at this time. Spleen: Normal size spleen. T2 hyperintense, T1  isointense lesion within the medial aspect of the spleen are again noted. Dominant lesion measures 2.9 cm, unchanged from previous exam. The dominant lesion contains multiple thin internal areas of enhancing septation. Favor benign lesion such as lymphangioma. Adrenals/Urinary Tract: Normal appearance of the adrenal glands. Multiple kidney cysts are identified bilaterally. The largest arises from the inferior pole of left kidney measuring 2.2 cm. The largest right kidney cyst measures 1 cm, image 25/17. Other cystic lesions measure less than 1 cm and are too small to reliably characterize. No solid enhancing kidney lesions or hydronephrosis. Stomach/Bowel: Visualized portions within the abdomen are unremarkable. Vascular/Lymphatic: Aortic atherosclerosis without aneurysm. No abdominal adenopathy. Other:  No free fluid or fluid collections. Musculoskeletal: No suspicious bone lesions identified. IMPRESSION: 1. Mild dilatation of the main pancreatic duct without obstructing lesion. Consider follow-up imaging in 6 months with repeat MR without and with contrast material to ensure stability of this likely benign abnormality. 2. Bilateral kidney cysts. 3. Stable splenic lesion compatible with intermediate imaging characteristics. Recommend followup imaging in 6 months to confirm stability. This recommendation follows ACR consensus guidelines: Management of Incidental Pancreatic Cysts: A White Paper of  the ACR Incidental Findings Committee. J Am Coll Radiol [id number]. 4.  Aortic Atherosclerosis (ICD10-I70.0). Electronically Signed   By: Kerby Moors M.D.   On: 04/25/2019 09:26   MR ABDOMEN MRCP W WO CONTAST  Result Date: 04/25/2019 CLINICAL DATA:  Evaluate dilated pancreatic duct. EXAM: MRI ABDOMEN WITHOUT AND WITH CONTRAST (INCLUDING MRCP) TECHNIQUE: Multiplanar multisequence MR imaging of the abdomen was performed both before and after the administration of intravenous contrast. Heavily T2-weighted images of  the biliary and pancreatic ducts were obtained, and three-dimensional MRCP images were rendered by post processing. CONTRAST:  12mL GADAVIST GADOBUTROL 1 MMOL/ML IV SOLN COMPARISON:  02/24/2019 FINDINGS: Lower chest: No acute findings. Hepatobiliary: No mass or other parenchymal abnormality identified. Gallbladder is unremarkable. No gallbladder wall inflammation or bile duct dilatation. Pancreas: No pancreatic inflammation identified. Mild ectasia of the main duct which measures up to 4 mm. No pancreatic mass identified at this time. Spleen: Normal size spleen. T2 hyperintense, T1 isointense lesion within the medial aspect of the spleen are again noted. Dominant lesion measures 2.9 cm, unchanged from previous exam. The dominant lesion contains multiple thin internal areas of enhancing septation. Favor benign lesion such as lymphangioma. Adrenals/Urinary Tract: Normal appearance of the adrenal glands. Multiple kidney cysts are identified bilaterally. The largest arises from the inferior pole of left kidney measuring 2.2 cm. The largest right kidney cyst measures 1 cm, image 25/17. Other cystic lesions measure less than 1 cm and are too small to reliably characterize. No solid enhancing kidney lesions or hydronephrosis. Stomach/Bowel: Visualized portions within the abdomen are unremarkable. Vascular/Lymphatic: Aortic atherosclerosis without aneurysm. No abdominal adenopathy. Other:  No free fluid or fluid collections. Musculoskeletal: No suspicious bone lesions identified. IMPRESSION: 1. Mild dilatation of the main pancreatic duct without obstructing lesion. Consider follow-up imaging in 6 months with repeat MR without and with contrast material to ensure stability of this likely benign abnormality. 2. Bilateral kidney cysts. 3. Stable splenic lesion compatible with intermediate imaging characteristics. Recommend followup imaging in 6 months to confirm stability. This recommendation follows ACR consensus guidelines:  Management of Incidental Pancreatic Cysts: A White Paper of the ACR Incidental Findings Committee. J Am Coll Radiol [id number]. 4.  Aortic Atherosclerosis (ICD10-I70.0). Electronically Signed   By: Kerby Moors M.D.   On: 04/25/2019 09:26     Lab Results  Component Value Date   CREATININE 1.23 04/20/2019   BUN 13 04/20/2019   NA 140 04/20/2019   K 4.0 04/20/2019   CL 104 04/20/2019   CO2 29 04/20/2019    Lab Results  Component Value Date   ALT 15 11/23/2018   AST 24 11/23/2018   ALKPHOS 51 11/23/2018   BILITOT 0.4 11/23/2018      Physical Exam: BP 126/80   Pulse 65   Temp 98.1 F (36.7 C)   Ht 5\' 10"  (1.778 m)   Wt 193 lb 2 oz (87.6 kg)   BMI 27.71 kg/m  Constitutional: Pleasant,well-developed, male in no acute distress. Abdominal: Soft, nondistended, nontender.  There are no masses palpable. No hepatomegaly. Neurological: Alert and oriented to person place and time. Skin: Skin is warm and dry. No rashes noted. Psychiatric: Normal mood and affect. Behavior is normal.   ASSESSMENT AND PLAN: 69 year old male here for reassessment of following:  Abnormal pancreatic imaging - incidentally noted to have 9 mm main pancreatic ductal dilation on CT scan that was done for work-up for hematuria in January.  He subsequently had an MRCP done which showed the duct to  be 4 mm without any concerning pathology in the pancreas otherwise.  Incidentally noted also to also have an intermediate risk splenic lesion which appears stable over time on imaging.  Discussed the findings with him.  The MRCP is more reassuring and does not show any concerning pathology in the pancreas, however agree with recommendations per radiology to follow this up with another MRCP in 6 months.  This will also reevaluate the splenic lesion.  If there is any interval enlargement we may consider an EUS.  I discussed what that would entail if needed.  He agreed with the plan, all questions answered.  Plan for  MRCP in September.  History of colon polyps - repeat colonoscopy 05/2020.  [place] Cellar, MD [facility]

## 2019-07-13 ENCOUNTER — Other Ambulatory Visit: Payer: Self-pay | Admitting: Family Medicine

## 2019-07-13 DIAGNOSIS — R300 Dysuria: Secondary | ICD-10-CM

## 2019-07-13 DIAGNOSIS — I10 Essential (primary) hypertension: Secondary | ICD-10-CM

## 2019-08-04 ENCOUNTER — Other Ambulatory Visit: Payer: Self-pay | Admitting: Family Medicine

## 2019-08-04 DIAGNOSIS — I10 Essential (primary) hypertension: Secondary | ICD-10-CM

## 2019-08-09 ENCOUNTER — Inpatient Hospital Stay (HOSPITAL_COMMUNITY)
Admission: EM | Admit: 2019-08-09 | Discharge: 2019-08-12 | DRG: 502 | Disposition: A | Discharge status: Home / Self Care | Payer: Medicare HMO | Attending: Specialist | Admitting: Specialist

## 2019-08-09 ENCOUNTER — Emergency Department (HOSPITAL_COMMUNITY): Payer: Medicare HMO

## 2019-08-09 ENCOUNTER — Other Ambulatory Visit: Payer: Self-pay | Admitting: Family Medicine

## 2019-08-09 ENCOUNTER — Encounter (HOSPITAL_COMMUNITY): Payer: Self-pay

## 2019-08-09 DIAGNOSIS — Z03818 Encounter for observation for suspected exposure to other biological agents ruled out: Secondary | ICD-10-CM | POA: Diagnosis not present

## 2019-08-09 DIAGNOSIS — M25462 Effusion, left knee: Secondary | ICD-10-CM | POA: Diagnosis not present

## 2019-08-09 DIAGNOSIS — Z823 Family history of stroke: Secondary | ICD-10-CM | POA: Diagnosis not present

## 2019-08-09 DIAGNOSIS — K635 Polyp of colon: Secondary | ICD-10-CM | POA: Diagnosis not present

## 2019-08-09 DIAGNOSIS — Z20822 Contact with and (suspected) exposure to covid-19: Secondary | ICD-10-CM | POA: Diagnosis not present

## 2019-08-09 DIAGNOSIS — Z01818 Encounter for other preprocedural examination: Secondary | ICD-10-CM | POA: Diagnosis not present

## 2019-08-09 DIAGNOSIS — S8992XA Unspecified injury of left lower leg, initial encounter: Secondary | ICD-10-CM | POA: Diagnosis not present

## 2019-08-09 DIAGNOSIS — R609 Edema, unspecified: Secondary | ICD-10-CM | POA: Diagnosis not present

## 2019-08-09 DIAGNOSIS — I10 Essential (primary) hypertension: Secondary | ICD-10-CM | POA: Diagnosis not present

## 2019-08-09 DIAGNOSIS — Z87891 Personal history of nicotine dependence: Secondary | ICD-10-CM | POA: Diagnosis not present

## 2019-08-09 DIAGNOSIS — Z79899 Other long term (current) drug therapy: Secondary | ICD-10-CM | POA: Diagnosis not present

## 2019-08-09 DIAGNOSIS — R000 Tachycardia, unspecified: Secondary | ICD-10-CM | POA: Diagnosis not present

## 2019-08-09 DIAGNOSIS — R0902 Hypoxemia: Secondary | ICD-10-CM | POA: Diagnosis not present

## 2019-08-09 DIAGNOSIS — M109 Gout, unspecified: Secondary | ICD-10-CM

## 2019-08-09 DIAGNOSIS — Z23 Encounter for immunization: Secondary | ICD-10-CM

## 2019-08-09 DIAGNOSIS — R52 Pain, unspecified: Secondary | ICD-10-CM | POA: Diagnosis not present

## 2019-08-09 DIAGNOSIS — W19XXXA Unspecified fall, initial encounter: Secondary | ICD-10-CM

## 2019-08-09 DIAGNOSIS — S83005A Unspecified dislocation of left patella, initial encounter: Secondary | ICD-10-CM | POA: Diagnosis present

## 2019-08-09 DIAGNOSIS — M25562 Pain in left knee: Secondary | ICD-10-CM | POA: Diagnosis not present

## 2019-08-09 DIAGNOSIS — M7989 Other specified soft tissue disorders: Secondary | ICD-10-CM | POA: Diagnosis not present

## 2019-08-09 DIAGNOSIS — W010XXA Fall on same level from slipping, tripping and stumbling without subsequent striking against object, initial encounter: Secondary | ICD-10-CM | POA: Diagnosis not present

## 2019-08-09 DIAGNOSIS — S76112A Strain of left quadriceps muscle, fascia and tendon, initial encounter: Secondary | ICD-10-CM | POA: Diagnosis not present

## 2019-08-09 DIAGNOSIS — Z809 Family history of malignant neoplasm, unspecified: Secondary | ICD-10-CM | POA: Diagnosis not present

## 2019-08-09 DIAGNOSIS — S86812A Strain of other muscle(s) and tendon(s) at lower leg level, left leg, initial encounter: Secondary | ICD-10-CM | POA: Diagnosis not present

## 2019-08-09 DIAGNOSIS — G8918 Other acute postprocedural pain: Secondary | ICD-10-CM | POA: Diagnosis not present

## 2019-08-09 MED ORDER — HYDROMORPHONE HCL 1 MG/ML IJ SOLN: 0.5000 mg | INTRAMUSCULAR | Status: DC | PRN

## 2019-08-09 MED ORDER — ACETAMINOPHEN 325 MG PO TABS
325.0000 mg | ORAL_TABLET | Freq: Four times a day (QID) | ORAL | Status: DC | PRN
  Administered 2019-08-12: 650 mg via ORAL
  Filled 2019-08-09 (×2): qty 2

## 2019-08-09 MED ORDER — TRAMADOL HCL 50 MG PO TABS
50.0000 mg | ORAL_TABLET | Freq: Four times a day (QID) | ORAL | Status: DC
  Administered 2019-08-10 – 2019-08-12 (×10): 50 mg via ORAL
  Filled 2019-08-09 (×10): qty 1

## 2019-08-09 MED ORDER — ONDANSETRON HCL 4 MG/2ML IJ SOLN: 4.0000 mg | Freq: Four times a day (QID) | INTRAMUSCULAR | Status: DC | PRN

## 2019-08-09 MED ORDER — BISACODYL 5 MG PO TBEC: 5.0000 mg | DELAYED_RELEASE_TABLET | Freq: Every day | ORAL | Status: DC | PRN

## 2019-08-09 MED ORDER — OXYCODONE HCL 5 MG PO TABS
5.0000 mg | ORAL_TABLET | ORAL | Status: DC | PRN
  Administered 2019-08-11: 10 mg via ORAL
  Filled 2019-08-09 (×2): qty 2

## 2019-08-09 MED ORDER — DOCUSATE SODIUM 100 MG PO CAPS
100.0000 mg | ORAL_CAPSULE | Freq: Two times a day (BID) | ORAL | Status: DC
  Administered 2019-08-10 (×2): 100 mg via ORAL
  Filled 2019-08-09 (×2): qty 1

## 2019-08-09 MED ORDER — MAGNESIUM CITRATE PO SOLN: 1.0000 | Freq: Once | ORAL | Status: DC | PRN

## 2019-08-09 MED ORDER — METHOCARBAMOL 500 MG PO TABS: 500.0000 mg | ORAL_TABLET | Freq: Four times a day (QID) | ORAL | Status: DC | PRN

## 2019-08-09 MED ORDER — POLYETHYLENE GLYCOL 3350 17 G PO PACK: 17.0000 g | PACK | Freq: Every day | ORAL | Status: DC | PRN

## 2019-08-09 MED ORDER — METHOCARBAMOL 1000 MG/10ML IJ SOLN: 500.0000 mg | Freq: Four times a day (QID) | INTRAVENOUS | Status: DC | PRN

## 2019-08-09 MED ORDER — OXYCODONE HCL 5 MG PO TABS
10.0000 mg | ORAL_TABLET | ORAL | Status: DC | PRN
  Administered 2019-08-10: 15 mg via ORAL
  Filled 2019-08-09: qty 3

## 2019-08-09 MED ORDER — ONDANSETRON HCL 4 MG PO TABS: 4.0000 mg | ORAL_TABLET | Freq: Four times a day (QID) | ORAL | Status: DC | PRN

## 2019-08-09 MED ORDER — SODIUM CHLORIDE 0.9 % IV SOLN: INTRAVENOUS | Status: DC

## 2019-08-09 MED ORDER — MORPHINE SULFATE (PF) 2 MG/ML IV SOLN
1.0000 mg | INTRAVENOUS | Status: DC | PRN
  Administered 2019-08-11 – 2019-08-12 (×4): 1 mg via INTRAVENOUS
  Filled 2019-08-09 (×4): qty 1

## 2019-08-09 NOTE — Telephone Encounter (Signed)
Requested medications are due for refill today?  Yes  Requested medications are on active medication list?  Yes  Last Refill:   11/23/2018  # 90 with one refill.    Future visit scheduled?  No   Notes to Clinic:  Medication failed RX refill protocol due to no uric acid level in the past 360 days.  Last uric acid level was 03/21/2018.

## 2019-08-09 NOTE — ED Triage Notes (Addendum)
Pt BIB [facility] for eval of fall tripping up steps. Pt is not anticoagulated. EMS placed splint on L leg reports obvious swelling to L knee. +PMS in triage to L foot.

## 2019-08-09 NOTE — H&P (Addendum)
PREOPERATIVE H&P  Chief Complaint: Left knee pain, swelling and inability to walk after fall.  HPI: Matthew Simon is a 69 y.o. male who presents for preoperative history and physical with a diagnosis of Left Closed distal quadriceps tendon rupture. Symptoms are rated as moderate to severe, and have been worsening.  This is significantly impairing activities of daily living.  He has elected for surgical management. He reportedly fell off his front porch steps when he caught his left foot on the top step. Landing on the left leg he was unable to stand or bear weight on the left leg. Brought to [facility] ER via ambulance and radiographs demonstrate  Left inferiorly displaced patella with soft tissue defect over the left distal anterior thigh consistent with a left distal quadriceps tendon rupture. History of HTN, and gout. No significant cardiac, renal or hepatic disease. Used an inhaler during period in August 2020 when he had contracted COVID-19, he has since recovered, had both doses of Phizer vaccination for COVID-19 this year..  Past Medical History:  Diagnosis Date  . Gout   . Hypertension    Past Surgical History:  Procedure Laterality Date  . COLONOSCOPY    . EXCISION MASS NECK N/A 05/24/2017   Procedure: EXCISION POSTERIOR NECK MASS;  Surgeon: Armandina Gemma, MD;  Location: [place];  Service: General;  Laterality: N/A;  . WISDOM TOOTH EXTRACTION     Social History   Socioeconomic History  . Marital status: Married    Spouse name: Not on file  . Number of children: Not on file  . Years of education: Not on file  . Highest education level: Not on file  Occupational History  . Occupation: business development  Tobacco Use  . Smoking status: Former Smoker    Quit date: 05/10/1964    Years since quitting: 55.2  . Smokeless tobacco: Never Used  Vaping Use  . Vaping Use: Never used  Substance and Sexual Activity  . Alcohol use: Yes    Alcohol/week: 21.0 standard drinks    Types: 21  Standard drinks or equivalent per week    Comment: wine with dinner  . Drug use: No  . Sexual activity: Not on file  Other Topics Concern  . Not on file  Social History Narrative  . Not on file   Social Determinants of Health   Financial Resource Strain:   . Difficulty of Paying Living Expenses:   Food Insecurity:   . Worried About Charity fundraiser in the Last Year:   . Arboriculturist in the Last Year:   Transportation Needs:   . Film/video editor (Medical):   Marland Kitchen Lack of Transportation (Non-Medical):   Physical Activity:   . Days of Exercise per Week:   . Minutes of Exercise per Session:   Stress:   . Feeling of Stress :   Social Connections:   . Frequency of Communication with Friends and Family:   . Frequency of Social Gatherings with Friends and Family:   . Attends Religious Services:   . Active Member of Clubs or Organizations:   . Attends Archivist Meetings:   Marland Kitchen Marital Status:    Family History  Problem Relation Age of Onset  . Cancer Mother        lymphoscarcoma  . Stroke Father   . Colon cancer Neg Hx   . Rectal cancer Neg Hx    No Known Allergies Prior to Admission medications   Medication Sig Start  Date End Date Taking? Authorizing Provider  albuterol (VENTOLIN HFA) 108 (90 Base) MCG/ACT inhaler INHALE 1-2 PUFFS INTO THE LUNGS EVERY 6 (SIX) HOURS AS NEEDED FOR WHEEZING OR SHORTNESS OF BREATH. 03/10/19   Wendie Agreste, MD  allopurinol (ZYLOPRIM) 100 MG tablet TAKE 1 TABLET BY MOUTH EVERY DAY 08/09/19   Wendie Agreste, MD  amLODipine (NORVASC) 5 MG tablet TAKE 1 TABLET BY MOUTH EVERY DAY 08/09/19   Wendie Agreste, MD  doxazosin (CARDURA) 2 MG tablet TAKE 1 TABLET BY MOUTH EVERY DAY 07/13/19   Wendie Agreste, MD  lisinopril (ZESTRIL) 20 MG tablet TAKE 1 TABLET BY MOUTH EVERY DAY 08/04/19   Wendie Agreste, MD     Positive ROS: All other systems have been reviewed and were otherwise negative with the exception of those mentioned in the  HPI and as above.  Physical Exam: General: Alert, no acute distress Cardiovascular: No pedal edema Respiratory: No cyanosis, no use of accessory musculature GI: No organomegaly, abdomen is soft and non-tender Skin: No lesions in the area of chief complaint Neurologic: Sensation intact distally Psychiatric: Patient is competent for consent with normal mood and affect Lymphatic: No axillary or cervical lymphadenopathy  MUSCULOSKELETAL: Left Anterior distal thigh with decrease in muscle mass. No skin wound or open wound. Left Patella is displaced distally. With moderate swelling anterior left patella. Unable to extend left knee actively. Left leg is NV normal, ROM hip is pain free.  Assessment: Left Distal Quadriceps Tendon rupture. Plan: Plan for Admit to Orthopaedics for pain control and assist with ADLs. NPO past 5 AM for OR in the afternoon. Left Distal Quadriceps Tendon Rupture Repair.   The risks benefits and alternatives were discussed with the patient including but not limited to the risks of nonoperative treatment, versus surgical intervention including infection, bleeding, nerve injury,  blood clots, cardiopulmonary complications, morbidity, mortality, among others, and they were willing to proceed.   Basil Dess, MD Cell [phone] Office [phone] 08/09/2019 11:59 PM

## 2019-08-09 NOTE — Telephone Encounter (Signed)
Requested Prescriptions  Pending Prescriptions Disp Refills  . amLODipine (NORVASC) 5 MG tablet [Pharmacy Med Name: AMLODIPINE BESYLATE 5 MG TAB] 30 tablet 0    Sig: TAKE 1 TABLET BY MOUTH EVERY DAY     Cardiovascular:  Calcium Channel Blockers Failed - 08/09/2019 12:09 AM      Failed - Valid encounter within last 6 months    Recent Outpatient Visits          7 months ago Urinary hesitancy   [facility], Ranell Patrick, MD   8 months ago Urinary hesitancy   [facility], Ranell Patrick, MD   10 months ago COVID-19   [facility], Ranell Patrick, MD   10 months ago Cough   [facility], Ranell Patrick, MD   10 months ago Cough   [facility], Ranell Patrick, MD             Passed - Last BP in normal range    BP Readings from Last 1 Encounters:  05/17/19 126/80         . allopurinol (ZYLOPRIM) 100 MG tablet [Pharmacy Med Name: ALLOPURINOL 100 MG TABLET] 90 tablet 1    Sig: TAKE 1 [place] DAY     Endocrinology:  Gout Agents Failed - 08/09/2019 12:09 AM      Failed - Uric Acid in normal range and within 360 days    Uric Acid  Date Value Ref Range Status  03/21/2018 5.1 3.7 - 8.6 mg/dL Final    Comment:               Therapeutic target for gout patients: <6.0         Passed - Cr in normal range and within 360 days    Creat  Date Value Ref Range Status  02/21/2015 1.28 (H) 0.70 - 1.25 mg/dL Final   Creatinine, Ser  Date Value Ref Range Status  04/20/2019 1.23 0.40 - 1.50 mg/dL Final         Passed - Valid encounter within last 12 months    Recent Outpatient Visits          7 months ago Urinary hesitancy   [facility], Ranell Patrick, MD   8 months ago Urinary hesitancy   [facility], Ranell Patrick, MD   10 months ago COVID-19   [facility], Ranell Patrick, MD   10 months ago Cough   [facility], Ranell Patrick, MD   10 months ago Cough    [facility], Ranell Patrick, MD             One month supply - Courtesy refill provided with notation that patient needs to schedule an appt for an office visit.

## 2019-08-10 ENCOUNTER — Encounter (HOSPITAL_COMMUNITY): Payer: Self-pay | Admitting: Specialist

## 2019-08-10 ENCOUNTER — Inpatient Hospital Stay (HOSPITAL_COMMUNITY): Payer: Medicare HMO

## 2019-08-10 ENCOUNTER — Encounter (HOSPITAL_COMMUNITY)
Admission: EM | Disposition: A | Discharge status: Home / Self Care | Payer: Self-pay | Source: Home / Self Care | Attending: Specialist

## 2019-08-10 ENCOUNTER — Inpatient Hospital Stay (HOSPITAL_COMMUNITY): Payer: Medicare HMO | Admitting: Certified Registered Nurse Anesthetist

## 2019-08-10 ENCOUNTER — Other Ambulatory Visit: Payer: Self-pay

## 2019-08-10 DIAGNOSIS — S76112A Strain of left quadriceps muscle, fascia and tendon, initial encounter: Principal | ICD-10-CM

## 2019-08-10 HISTORY — PX: QUADRICEPS TENDON REPAIR: SHX756

## 2019-08-10 LAB — CBC
HCT: 37.4 % — ABNORMAL LOW (ref 39.0–52.0)
Hemoglobin: 11.9 g/dL — ABNORMAL LOW (ref 13.0–17.0)
MCH: 29.3 pg (ref 26.0–34.0)
MCHC: 31.8 g/dL (ref 30.0–36.0)
MCV: 92.1 fL (ref 80.0–100.0)
Platelets: 262 10*3/uL (ref 150–400)
RBC: 4.06 MIL/uL — ABNORMAL LOW (ref 4.22–5.81)
RDW: 12.1 % (ref 11.5–15.5)
WBC: 7.7 10*3/uL (ref 4.0–10.5)
nRBC: 0 % (ref 0.0–0.2)

## 2019-08-10 LAB — COMPREHENSIVE METABOLIC PANEL
ALT: 16 U/L (ref 0–44)
AST: 25 U/L (ref 15–41)
Albumin: 3.9 g/dL (ref 3.5–5.0)
Alkaline Phosphatase: 43 U/L (ref 38–126)
Anion gap: 10 (ref 5–15)
BUN: 8 mg/dL (ref 8–23)
CO2: 24 mmol/L (ref 22–32)
Calcium: 9.1 mg/dL (ref 8.9–10.3)
Chloride: 104 mmol/L (ref 98–111)
Creatinine, Ser: 1.25 mg/dL — ABNORMAL HIGH (ref 0.61–1.24)
GFR calc Af Amer: >60 mL/min (ref >60)
GFR calc non Af Amer: 58 mL/min — ABNORMAL LOW (ref >60)
Glucose, Bld: 139 mg/dL — ABNORMAL HIGH (ref 70–99)
Potassium: 3.7 mmol/L (ref 3.5–5.1)
Sodium: 138 mmol/L (ref 135–145)
Total Bilirubin: 0.8 mg/dL (ref 0.3–1.2)
Total Protein: 6.6 g/dL (ref 6.5–8.1)

## 2019-08-10 LAB — HIV ANTIBODY (ROUTINE TESTING W REFLEX): HIV Screen 4th Generation wRfx: NONREACTIVE

## 2019-08-10 LAB — SURGICAL PCR SCREEN
MRSA, PCR: NEGATIVE
Staphylococcus aureus: POSITIVE — AB

## 2019-08-10 LAB — SARS CORONAVIRUS 2 BY RT PCR (HOSPITAL ORDER, PERFORMED IN ~~LOC~~ HOSPITAL LAB): SARS Coronavirus 2: NEGATIVE

## 2019-08-10 SURGERY — REPAIR, TENDON, QUADRICEPS
Anesthesia: General | Site: Knee | Laterality: Left

## 2019-08-10 MED ORDER — MUPIROCIN 2 % EX OINT
1.0000 "application " | TOPICAL_OINTMENT | Freq: Two times a day (BID) | CUTANEOUS | Status: DC
  Administered 2019-08-10 – 2019-08-12 (×5): 1 via NASAL
  Filled 2019-08-10: qty 22

## 2019-08-10 MED ORDER — METOCLOPRAMIDE HCL 5 MG PO TABS: 5.0000 mg | ORAL_TABLET | Freq: Three times a day (TID) | ORAL | Status: DC | PRN

## 2019-08-10 MED ORDER — TAMSULOSIN HCL 0.4 MG PO CAPS
0.4000 mg | ORAL_CAPSULE | Freq: Every day | ORAL | Status: DC
  Administered 2019-08-10 – 2019-08-11 (×2): 0.4 mg via ORAL
  Filled 2019-08-10 (×2): qty 1

## 2019-08-10 MED ORDER — METOCLOPRAMIDE HCL 5 MG/ML IJ SOLN: 5.0000 mg | Freq: Three times a day (TID) | INTRAMUSCULAR | Status: DC | PRN

## 2019-08-10 MED ORDER — SODIUM CHLORIDE 0.9 % IR SOLN
Status: DC | PRN
  Administered 2019-08-10: 3000 mL

## 2019-08-10 MED ORDER — SODIUM CHLORIDE 0.9 % IV SOLN: INTRAVENOUS | Status: DC

## 2019-08-10 MED ORDER — CLONIDINE HCL (ANALGESIA) 100 MCG/ML EP SOLN
EPIDURAL | Status: DC | PRN
Start: 2019-08-10 — End: 2019-08-10
  Administered 2019-08-10: 50 ug

## 2019-08-10 MED ORDER — HYDROMORPHONE HCL 1 MG/ML IJ SOLN: 0.2500 mg | INTRAMUSCULAR | Status: DC | PRN

## 2019-08-10 MED ORDER — ONDANSETRON HCL 4 MG/2ML IJ SOLN
INTRAMUSCULAR | Status: DC | PRN
  Administered 2019-08-10: 4 mg via INTRAVENOUS

## 2019-08-10 MED ORDER — METHOCARBAMOL 1000 MG/10ML IJ SOLN
500.0000 mg | Freq: Four times a day (QID) | INTRAVENOUS | Status: DC | PRN
  Filled 2019-08-10: qty 5

## 2019-08-10 MED ORDER — PHENYLEPHRINE HCL (PRESSORS) 10 MG/ML IV SOLN
INTRAVENOUS | Status: DC | PRN
Start: 2019-08-10 — End: 2019-08-10
  Administered 2019-08-10: 80 ug via INTRAVENOUS
  Administered 2019-08-10: 160 ug via INTRAVENOUS
  Administered 2019-08-10: 80 ug via INTRAVENOUS

## 2019-08-10 MED ORDER — CEFAZOLIN SODIUM-DEXTROSE 2-4 GM/100ML-% IV SOLN
2.0000 g | INTRAVENOUS | Status: AC
  Administered 2019-08-10: 2 g via INTRAVENOUS
  Filled 2019-08-10: qty 100

## 2019-08-10 MED ORDER — FENTANYL CITRATE (PF) 250 MCG/5ML IJ SOLN
INTRAMUSCULAR | Status: AC
  Filled 2019-08-10: qty 5

## 2019-08-10 MED ORDER — BUPIVACAINE HCL (PF) 0.25 % IJ SOLN
INTRAMUSCULAR | Status: AC
  Filled 2019-08-10: qty 30

## 2019-08-10 MED ORDER — DOCUSATE SODIUM 100 MG PO CAPS
100.0000 mg | ORAL_CAPSULE | Freq: Two times a day (BID) | ORAL | Status: DC
  Administered 2019-08-10 – 2019-08-12 (×4): 100 mg via ORAL
  Filled 2019-08-10 (×4): qty 1

## 2019-08-10 MED ORDER — MEPERIDINE HCL 25 MG/ML IJ SOLN: 6.2500 mg | INTRAMUSCULAR | Status: DC | PRN

## 2019-08-10 MED ORDER — PROPOFOL 10 MG/ML IV BOLUS
INTRAVENOUS | Status: DC | PRN
  Administered 2019-08-10: 200 mg via INTRAVENOUS

## 2019-08-10 MED ORDER — CEFAZOLIN SODIUM-DEXTROSE 2-4 GM/100ML-% IV SOLN
2.0000 g | Freq: Three times a day (TID) | INTRAVENOUS | Status: AC
  Administered 2019-08-11 (×3): 2 g via INTRAVENOUS
  Filled 2019-08-10 (×3): qty 100

## 2019-08-10 MED ORDER — FENTANYL CITRATE (PF) 100 MCG/2ML IJ SOLN: 100.0000 ug | Freq: Once | INTRAMUSCULAR | Status: AC

## 2019-08-10 MED ORDER — PHENYLEPHRINE 40 MCG/ML (10ML) SYRINGE FOR IV PUSH (FOR BLOOD PRESSURE SUPPORT)
PREFILLED_SYRINGE | INTRAVENOUS | Status: AC
  Filled 2019-08-10: qty 10

## 2019-08-10 MED ORDER — MORPHINE SULFATE (PF) 4 MG/ML IV SOLN
INTRAVENOUS | Status: AC
  Filled 2019-08-10: qty 2

## 2019-08-10 MED ORDER — HYDROMORPHONE HCL 1 MG/ML IJ SOLN
0.5000 mg | Freq: Once | INTRAMUSCULAR | Status: AC
  Administered 2019-08-10: 0.5 mg via INTRAVENOUS
  Filled 2019-08-10: qty 1

## 2019-08-10 MED ORDER — ONDANSETRON HCL 4 MG/2ML IJ SOLN
INTRAMUSCULAR | Status: AC
  Filled 2019-08-10: qty 4

## 2019-08-10 MED ORDER — FENTANYL CITRATE (PF) 100 MCG/2ML IJ SOLN
INTRAMUSCULAR | Status: DC | PRN
  Administered 2019-08-10: 50 ug via INTRAVENOUS

## 2019-08-10 MED ORDER — CLONIDINE HCL (ANALGESIA) 100 MCG/ML EP SOLN
150.0000 ug | Freq: Once | EPIDURAL | Status: DC
  Filled 2019-08-10: qty 10

## 2019-08-10 MED ORDER — KETOROLAC TROMETHAMINE 15 MG/ML IJ SOLN
15.0000 mg | Freq: Once | INTRAMUSCULAR | Status: AC
  Administered 2019-08-10: 15 mg via INTRAVENOUS

## 2019-08-10 MED ORDER — HYDROMORPHONE HCL 1 MG/ML IJ SOLN
0.5000 mg | INTRAMUSCULAR | Status: DC | PRN
  Administered 2019-08-11 (×2): 0.5 mg via INTRAVENOUS
  Filled 2019-08-10 (×2): qty 1

## 2019-08-10 MED ORDER — DOXAZOSIN MESYLATE 2 MG PO TABS
2.0000 mg | ORAL_TABLET | Freq: Every day | ORAL | Status: DC
  Administered 2019-08-11 – 2019-08-12 (×2): 2 mg via ORAL
  Filled 2019-08-10 (×4): qty 1

## 2019-08-10 MED ORDER — DEXAMETHASONE SODIUM PHOSPHATE 10 MG/ML IJ SOLN
INTRAMUSCULAR | Status: AC
  Filled 2019-08-10: qty 2

## 2019-08-10 MED ORDER — LIDOCAINE HCL (CARDIAC) PF 100 MG/5ML IV SOSY
PREFILLED_SYRINGE | INTRAVENOUS | Status: DC | PRN
  Administered 2019-08-10: 60 mg via INTRAVENOUS

## 2019-08-10 MED ORDER — OXYCODONE HCL 5 MG PO TABS
5.0000 mg | ORAL_TABLET | ORAL | Status: DC | PRN
  Administered 2019-08-11 – 2019-08-12 (×3): 10 mg via ORAL
  Filled 2019-08-10 (×2): qty 2

## 2019-08-10 MED ORDER — PHENYLEPHRINE HCL-NACL 10-0.9 MG/250ML-% IV SOLN
INTRAVENOUS | Status: DC | PRN
  Administered 2019-08-10: 30 ug/min via INTRAVENOUS

## 2019-08-10 MED ORDER — ROPIVACAINE HCL 5 MG/ML IJ SOLN
INTRAMUSCULAR | Status: DC | PRN
  Administered 2019-08-10 (×2): 5 mL via PERINEURAL

## 2019-08-10 MED ORDER — LIDOCAINE 2% (20 MG/ML) 5 ML SYRINGE
INTRAMUSCULAR | Status: AC
  Filled 2019-08-10: qty 5

## 2019-08-10 MED ORDER — ONDANSETRON HCL 4 MG PO TABS: 4.0000 mg | ORAL_TABLET | Freq: Four times a day (QID) | ORAL | Status: DC | PRN

## 2019-08-10 MED ORDER — FENTANYL CITRATE (PF) 100 MCG/2ML IJ SOLN
INTRAMUSCULAR | Status: AC
  Administered 2019-08-10: 100 ug via INTRAVENOUS
  Filled 2019-08-10: qty 2

## 2019-08-10 MED ORDER — CHLORHEXIDINE GLUCONATE 0.12 % MT SOLN
15.0000 mL | Freq: Once | OROMUCOSAL | Status: AC
  Administered 2019-08-10: 15 mL via OROMUCOSAL
  Filled 2019-08-10: qty 15

## 2019-08-10 MED ORDER — CLONIDINE HCL (ANALGESIA) 100 MCG/ML EP SOLN
EPIDURAL | Status: DC | PRN
  Administered 2019-08-10: 1 mL

## 2019-08-10 MED ORDER — KETOROLAC TROMETHAMINE 15 MG/ML IJ SOLN
INTRAMUSCULAR | Status: AC
  Filled 2019-08-10: qty 1

## 2019-08-10 MED ORDER — LACTATED RINGERS IV SOLN: INTRAVENOUS | Status: DC

## 2019-08-10 MED ORDER — MIDAZOLAM HCL 2 MG/2ML IJ SOLN
INTRAMUSCULAR | Status: AC
  Administered 2019-08-10: 2 mg via INTRAVENOUS
  Filled 2019-08-10: qty 2

## 2019-08-10 MED ORDER — ROPIVACAINE HCL 7.5 MG/ML IJ SOLN
INTRAMUSCULAR | Status: DC | PRN
  Administered 2019-08-10 (×4): 5 mL via PERINEURAL

## 2019-08-10 MED ORDER — PNEUMOCOCCAL VAC POLYVALENT 25 MCG/0.5ML IJ INJ
0.5000 mL | INJECTION | INTRAMUSCULAR | Status: AC
  Administered 2019-08-12: 0.5 mL via INTRAMUSCULAR
  Filled 2019-08-10: qty 0.5

## 2019-08-10 MED ORDER — ALBUTEROL SULFATE (2.5 MG/3ML) 0.083% IN NEBU: 2.5000 mg | INHALATION_SOLUTION | Freq: Four times a day (QID) | RESPIRATORY_TRACT | Status: DC | PRN

## 2019-08-10 MED ORDER — CHLORHEXIDINE GLUCONATE 4 % EX LIQD
60.0000 mL | Freq: Once | CUTANEOUS | Status: AC
  Administered 2019-08-10: 4 via TOPICAL
  Filled 2019-08-10: qty 60

## 2019-08-10 MED ORDER — POVIDONE-IODINE 10 % EX SWAB
2.0000 "application " | Freq: Once | CUTANEOUS | Status: AC
  Administered 2019-08-10: 2 via TOPICAL

## 2019-08-10 MED ORDER — DEXAMETHASONE SODIUM PHOSPHATE 10 MG/ML IJ SOLN
INTRAMUSCULAR | Status: DC | PRN
Start: 2019-08-10 — End: 2019-08-10
  Administered 2019-08-10: 4 mg via INTRAVENOUS

## 2019-08-10 MED ORDER — ORAL CARE MOUTH RINSE: 15.0000 mL | Freq: Once | OROMUCOSAL | Status: AC

## 2019-08-10 MED ORDER — ASPIRIN 81 MG PO CHEW
81.0000 mg | CHEWABLE_TABLET | Freq: Every day | ORAL | Status: DC
  Administered 2019-08-10 – 2019-08-12 (×3): 81 mg via ORAL
  Filled 2019-08-10 (×3): qty 1

## 2019-08-10 MED ORDER — LISINOPRIL 20 MG PO TABS
20.0000 mg | ORAL_TABLET | Freq: Every day | ORAL | Status: DC
  Administered 2019-08-10 – 2019-08-12 (×3): 20 mg via ORAL
  Filled 2019-08-10 (×3): qty 1

## 2019-08-10 MED ORDER — AMLODIPINE BESYLATE 5 MG PO TABS
5.0000 mg | ORAL_TABLET | Freq: Every day | ORAL | Status: DC
  Administered 2019-08-10 – 2019-08-12 (×3): 5 mg via ORAL
  Filled 2019-08-10 (×3): qty 1

## 2019-08-10 MED ORDER — ACETAMINOPHEN 500 MG PO TABS
1000.0000 mg | ORAL_TABLET | Freq: Four times a day (QID) | ORAL | Status: AC
  Administered 2019-08-11 (×4): 1000 mg via ORAL
  Filled 2019-08-10 (×4): qty 2

## 2019-08-10 MED ORDER — ALLOPURINOL 100 MG PO TABS
100.0000 mg | ORAL_TABLET | Freq: Every day | ORAL | Status: DC
  Administered 2019-08-10 – 2019-08-12 (×3): 100 mg via ORAL
  Filled 2019-08-10 (×4): qty 1

## 2019-08-10 MED ORDER — EPHEDRINE 5 MG/ML INJ
INTRAVENOUS | Status: AC
  Filled 2019-08-10: qty 10

## 2019-08-10 MED ORDER — METHOCARBAMOL 500 MG PO TABS
500.0000 mg | ORAL_TABLET | Freq: Four times a day (QID) | ORAL | Status: DC | PRN
  Administered 2019-08-11 – 2019-08-12 (×2): 500 mg via ORAL
  Filled 2019-08-10 (×3): qty 1

## 2019-08-10 MED ORDER — ONDANSETRON HCL 4 MG/2ML IJ SOLN: 4.0000 mg | Freq: Four times a day (QID) | INTRAMUSCULAR | Status: DC | PRN

## 2019-08-10 MED ORDER — MIDAZOLAM HCL 2 MG/2ML IJ SOLN: 2.0000 mg | Freq: Once | INTRAMUSCULAR | Status: AC

## 2019-08-10 MED ORDER — EPHEDRINE SULFATE 50 MG/ML IJ SOLN
INTRAMUSCULAR | Status: DC | PRN
  Administered 2019-08-10 (×3): 10 mg via INTRAVENOUS

## 2019-08-10 MED ORDER — MORPHINE SULFATE (PF) 4 MG/ML IV SOLN
INTRAVENOUS | Status: DC | PRN
  Administered 2019-08-10: 8 mg

## 2019-08-10 MED ORDER — BUPIVACAINE HCL (PF) 0.25 % IJ SOLN
INTRAMUSCULAR | Status: DC | PRN
  Administered 2019-08-10: 40 mL

## 2019-08-10 MED ORDER — PROMETHAZINE HCL 25 MG/ML IJ SOLN: 6.2500 mg | INTRAMUSCULAR | Status: DC | PRN

## 2019-08-10 MED ORDER — BUPIVACAINE HCL (PF) 0.25 % IJ SOLN
INTRAMUSCULAR | Status: AC
  Filled 2019-08-10: qty 10

## 2019-08-10 SURGICAL SUPPLY — 46 items
BANDAGE ESMARK 6X9 LF (GAUZE/BANDAGES/DRESSINGS) ×1 IMPLANT
BLADE SURG 10 STRL SS (BLADE) ×2 IMPLANT
BNDG CMPR 9X6 STRL LF SNTH (GAUZE/BANDAGES/DRESSINGS) ×1
BNDG CMPR MED 10X6 ELC LF (GAUZE/BANDAGES/DRESSINGS) ×1
BNDG ELASTIC 6X10 VLCR STRL LF (GAUZE/BANDAGES/DRESSINGS) ×1 IMPLANT
BNDG ESMARK 6X9 LF (GAUZE/BANDAGES/DRESSINGS) ×2
CLSR STERI-STRIP ANTIMIC 1/2X4 (GAUZE/BANDAGES/DRESSINGS) ×1 IMPLANT
COVER SURGICAL LIGHT HANDLE (MISCELLANEOUS) ×2 IMPLANT
CUFF TOURN SGL QUICK 34 (TOURNIQUET CUFF) ×2
CUFF TRNQT CYL 34X4.125X (TOURNIQUET CUFF) ×1 IMPLANT
DRAPE EXTREMITY T 121X128X90 (DISPOSABLE) ×1 IMPLANT
DRAPE INCISE IOBAN 66X45 STRL (DRAPES) ×2 IMPLANT
DRSG AQUACEL AG ADV 3.5X10 (GAUZE/BANDAGES/DRESSINGS) ×1 IMPLANT
DURAPREP 26ML APPLICATOR (WOUND CARE) ×2 IMPLANT
ELECT REM PT RETURN 9FT ADLT (ELECTROSURGICAL) ×2
ELECTRODE REM PT RTRN 9FT ADLT (ELECTROSURGICAL) ×1 IMPLANT
GLOVE BIOGEL PI IND STRL 8 (GLOVE) ×1 IMPLANT
GLOVE BIOGEL PI INDICATOR 8 (GLOVE) ×1
GLOVE ECLIPSE 8.0 STRL XLNG CF (GLOVE) ×2 IMPLANT
GOWN STRL REUS W/ TWL LRG LVL3 (GOWN DISPOSABLE) ×1 IMPLANT
GOWN STRL REUS W/ TWL XL LVL3 (GOWN DISPOSABLE) ×1 IMPLANT
GOWN STRL REUS W/TWL LRG LVL3 (GOWN DISPOSABLE) ×2
GOWN STRL REUS W/TWL XL LVL3 (GOWN DISPOSABLE) ×2
KIT BASIN OR (CUSTOM PROCEDURE TRAY) ×2 IMPLANT
KIT TURNOVER KIT B (KITS) ×2 IMPLANT
MANIFOLD NEPTUNE II (INSTRUMENTS) ×2 IMPLANT
NEEDLE TAPERED W/ NITINOL LOOP (MISCELLANEOUS) ×4 IMPLANT
NS IRRIG 1000ML POUR BTL (IV SOLUTION) ×3 IMPLANT
PACK GENERAL/GYN (CUSTOM PROCEDURE TRAY) ×2 IMPLANT
PACK ORTHO EXTREMITY (CUSTOM PROCEDURE TRAY) ×2 IMPLANT
PAD ARMBOARD 7.5X6 YLW CONV (MISCELLANEOUS) ×4 IMPLANT
PENCIL BUTTON HOLSTER BLD 10FT (ELECTRODE) ×2 IMPLANT
STOCKINETTE IMPERVIOUS 9X36 MD (GAUZE/BANDAGES/DRESSINGS) ×2 IMPLANT
SUT MNCRL AB 3-0 PS2 27 (SUTURE) ×1 IMPLANT
SUT VIC AB 0 CT1 27 (SUTURE) ×6
SUT VIC AB 0 CT1 27XBRD ANBCTR (SUTURE) IMPLANT
SUT VIC AB 1 CT1 36 (SUTURE) ×4 IMPLANT
SUT VIC AB 2-0 CT1 27 (SUTURE) ×6
SUT VIC AB 2-0 CT1 TAPERPNT 27 (SUTURE) ×3 IMPLANT
SUTURE TAPE 1.3 40 TPR END (SUTURE) ×2 IMPLANT
SUTURETAPE 1.3 40 TPR END (SUTURE) ×4
SYS INTERNAL BRACE KNEE (Miscellaneous) ×2 IMPLANT
SYSTEM INTERNAL BRACE KNEE (Miscellaneous) IMPLANT
TOWEL GREEN STERILE (TOWEL DISPOSABLE) ×2 IMPLANT
TUBE CONNECTING 12X1/4 (SUCTIONS) ×2 IMPLANT
YANKAUER SUCT BULB TIP NO VENT (SUCTIONS) ×2 IMPLANT

## 2019-08-10 NOTE — Progress Notes (Signed)
Orthopedic Tech Progress Note Patient Details:  [name] 08/20/1950 [id number] Called in brace Patient ID: Matthew Simon, male   DOB: 1950/09/10, 69 y.o.   MRN: [id number]   [name] 08/10/2019, 8:35 PM

## 2019-08-10 NOTE — ED Notes (Signed)
Ordered breakfast--Sael Furches 

## 2019-08-10 NOTE — Anesthesia Preprocedure Evaluation (Signed)
Anesthesia Evaluation  Patient identified by MRN, date of birth, ID band Patient awake    Reviewed: Allergy & Precautions, NPO status , Patient's Chart, lab work & pertinent test results  Airway Mallampati: II  TM Distance: >3 FB Neck ROM: Full    Dental no notable dental hx.    Pulmonary former smoker,    Pulmonary exam normal breath sounds clear to auscultation       Cardiovascular hypertension, Pt. on medications Normal cardiovascular exam Rhythm:Regular Rate:Normal     Neuro/Psych negative neurological ROS  negative psych ROS   GI/Hepatic negative GI ROS, Neg liver ROS,   Endo/Other  negative endocrine ROS  Renal/GU negative Renal ROS  negative genitourinary   Musculoskeletal negative musculoskeletal ROS (+)   Abdominal Normal abdominal exam  (+)   Peds negative pediatric ROS (+)  Hematology negative hematology ROS (+)   Anesthesia Other Findings   Reproductive/Obstetrics negative OB ROS                             Anesthesia Physical  Anesthesia Plan  ASA: II  Anesthesia Plan: General   Post-op Pain Management:  Regional for Post-op pain   Induction: Intravenous  PONV Risk Score and Plan: 2 and Ondansetron and Midazolam  Airway Management Planned: LMA  Additional Equipment: None  Intra-op Plan:   Post-operative Plan: Extubation in OR  Informed Consent: I have reviewed the patients History and Physical, chart, labs and discussed the procedure including the risks, benefits and alternatives for the proposed anesthesia with the patient or authorized representative who has indicated his/her understanding and acceptance.     Dental advisory given  Plan Discussed with: CRNA  Anesthesia Plan Comments:         Anesthesia Quick Evaluation

## 2019-08-10 NOTE — Progress Notes (Signed)
Seen with Dr. Marlou Sa.  Findings consistent with a left quadriceps distal tendon rupture but there are subtle changes on plain radiographs of the left knee patella possible distal pole injury or avulsion of patella tendon. MRI is ordered. Left leg is with normal sensation and motor distal to the knee. Impression: Left quadriceps distal tendon rupture. Possible intra articular left knee patella injury or meniscal with traumatic effusion Plan: MRI of the left knee STAT Dr. Marlou Sa will schedule for OR for repair of left distal Quadriceps distal tendon repair likely at  About 4-5 PM.  NPO except for meds with sip. IVF and IV pain meds.Marland Kitchen

## 2019-08-10 NOTE — Anesthesia Procedure Notes (Signed)
Anesthesia Regional Block: Adductor canal block   Pre-Anesthetic Checklist: ,, timeout performed, Correct Patient, Correct Site, Correct Laterality, Correct Procedure, Correct Position, site marked, Risks and benefits discussed,  Surgical consent,  Pre-op evaluation,  At surgeon's request and post-op pain management  Laterality: Lower and Left  Prep: chloraprep       Needles:  Injection technique: Single-shot  Needle Type: Echogenic Stimulator Needle     Needle Length: 9cm  Needle Gauge: 20     Additional Needles:   Procedures:,,,, ultrasound used (permanent image in chart),,,,  Narrative:  Start time: 08/10/2019 4:20 PM End time: 08/10/2019 4:30 PM Injection made incrementally with aspirations every 5 mL.  Performed by: Personally  Anesthesiologist: Lyn Hollingshead, MD

## 2019-08-10 NOTE — OR Nursing (Signed)
Care of patient assumed at 1915. 

## 2019-08-10 NOTE — Progress Notes (Signed)
Orthopedic Tech Progress Note Patient Details:  DEWAYNE SEVERE 08-26-1950 [id number]  Ortho Devices Type of Ortho Device: Knee Immobilizer Ortho Device/Splint Location: lle. applied with drs assist. stockinette then webril then ace wrap then knee immobilizer. Ortho Device/Splint Interventions: Ordered, Application, Adjustment   Post Interventions Patient Tolerated: Well Instructions Provided: Care of device, Adjustment of device   Karolee Stamps 08/10/2019, 1:08 AM

## 2019-08-10 NOTE — Transfer of Care (Signed)
Immediate Anesthesia Transfer of Care Note  Patient: Matthew Simon  Procedure(s) Performed: REPAIR QUADRICEP TENDON RUPTURE (Left Knee)  Patient Location: PACU  Anesthesia Type:GA combined with regional for post-op pain  Level of Consciousness: drowsy and patient cooperative  Airway & Oxygen Therapy: Patient Spontanous Breathing  Post-op Assessment: Report given to RN and Post -op Vital signs reviewed and stable  Post vital signs: Reviewed and stable  Last Vitals:  Vitals Value Taken Time  BP 135/77 08/10/19 2002  Temp    Pulse 77 08/10/19 2003  Resp 14 08/10/19 2003  SpO2 95 % 08/10/19 2003  Vitals shown include unvalidated device data.  Last Pain:  Vitals:   08/10/19 1630  TempSrc:   PainSc: 0-No pain      Patients Stated Pain Goal: 3 (42/87/68 1157)  Complications: No complications documented.

## 2019-08-10 NOTE — Op Note (Signed)
NAME: Matthew Simon, Matthew Simon MEDICAL RECORD [id number] ACCOUNT [id number] DATE OF BIRTH:August 09, 1950 FACILITY: [facility] LOCATION: [facility] PHYSICIAN:Jareth Pardee Randel Pigg, MD  OPERATIVE REPORT  DATE OF PROCEDURE:  08/10/2019  PREOPERATIVE DIAGNOSIS:  Left quadriceps tendon rupture.  POSTOPERATIVE DIAGNOSIS:  Left quadriceps tendon rupture.  PROCEDURE:  Left quadriceps tendon rupture repair.  SURGEON:  Meredith Pel, MD  ASSISTANT:  Annie Main, PA.  INDICATIONS:  The patient is a 69 year old patient with left quad tendon rupture who presents for operative management after explanation of risks and benefits.  PROCEDURE IN DETAIL:  The patient was brought to the operating room where general endotracheal anesthesia was induced.  Preoperative antibiotics administered.  Timeout was called.  Left leg was prescrubbed with alcohol and Betadine, allowed to air dry,  prepped with DuraPrep solution and draped in a sterile manner.  Ioban used to cover the operative field.  Left leg was elevated, exsanguinated with the Esmarch wrap.  Tourniquet was inflated.  Total tourniquet time 59 minutes at 300 mmHg.  An incision  was made over the quad tendon, extending distally towards the patella.  Skin and subcutaneous tissue were sharply divided.  Hematoma was present.  The patient had a complete quad tendon rupture with retinacular tears medially and laterally.  Thorough  irrigation of the joint was performed.  Using 1.3 mm suture tape from Arthrex, grasping [place] suture was placed x2 in the quad tendon extending distally.  These were then secured into the prepared edge of the proximal pole of the patella using 2 Arthrex  SwiveLocks.  Secure fixation was achieved.  The retinaculum was then repaired with #1 Vicryl suture.  All in all, a very stable repair was achieved with good bony opposition of the tendon to the prepared bony bed on the patella.  Tourniquet was  released.  Bleeding points encountered were controlled  using electrocautery.  Thorough irrigation was again performed.  A solution of Marcaine, morphine, clonidine injected into the incision and the knee for postop pain relief.  The patient tolerated the  procedure well without immediate complications.  Incision was closed using #1 Vicryl suture, followed by interrupted inverted 0 Vicryl suture, 2-0 Vicryl suture and a 3-0 Monocryl.  Steri-Strips and Aquacel dressing applied.  A knee immobilizer applied.   The patient will be allowed to be weightbearing as tolerated in a Bledsoe brace locked in extension for the first 3 weeks.  Luke's assistance was required at all times during the case for retraction, opening and closing, as well as mobilization of  tissues.  His assistance was a medical necessity.  VN/NUANCE  D:08/10/2019 T:08/10/2019 JOB:[id number]

## 2019-08-10 NOTE — Brief Op Note (Addendum)
   08/09/2019 - 08/10/2019  7:28 PM  PATIENT:  Matthew Simon  69 y.o. male  PRE-OPERATIVE DIAGNOSIS:  left quad tendon rupture  POST-OPERATIVE DIAGNOSIS:  left quad tendon rupture  PROCEDURE:  Procedure(s): REPAIR QUADRICEP TENDON RUPTURE  SURGEON:  Surgeon(s): Marlou Sa, Tonna Corner, MD  ASSISTANT: Annie Main, PA  ANESTHESIA:   general  EBL: 50 ml    No intake/output data recorded.  BLOOD ADMINISTERED: none  DRAINS: none   LOCAL MEDICATIONS USED: Marcaine morphine clonidine  SPECIMEN:  No Specimen  COUNTS:  YES  TOURNIQUET:   Total Tourniquet Time Documented: Thigh (Left) - 59 minutes Total: Thigh (Left) - 59 minutes   DICTATION: .Other Dictation: Dictation Number [id number]  PLAN OF CARE: Admit for overnight observation  PATIENT DISPOSITION:  PACU - hemodynamically stable

## 2019-08-10 NOTE — Plan of Care (Signed)
  Problem: Education: Goal: Knowledge of General Education information will improve Description: Including pain rating scale, medication(s)/side effects and non-pharmacologic comfort measures Outcome: Progressing   Problem: Coping: Goal: Level of anxiety will decrease Outcome: Progressing   Problem: Elimination: Goal: Will not experience complications related to urinary retention Outcome: Progressing Note: Using urinal   Problem: Pain Managment: Goal: General experience of comfort will improve Outcome: Progressing Note: Treated left knee pain once with scheduled tramadol    Problem: Safety: Goal: Ability to remain free from injury will improve Outcome: Progressing

## 2019-08-10 NOTE — Progress Notes (Signed)
Patient examined.  He injured himself last night.  Has effusion in the left knee and deficient extensor mechanism.  Radiographs suggest quad tendon rupture with relative patella baja.  No other fracture present.  Plan at this time is MRI scan with later quad rupture repair today.  Difficult to get a definitive exam on role because of his soft tissue swelling and patellar hematoma.  The risk and benefits of surgery are discussed.  All questions answered

## 2019-08-10 NOTE — ED Provider Notes (Signed)
[facility] EMERGENCY DEPARTMENT Provider Note   CSN: [id number] Arrival date & time: 08/09/19  2225     History Chief Complaint  Patient presents with  . Fall    Matthew Simon is a 69 y.o. male.  Patient presents to the emergency department for evaluation after a fall.  Patient reports that he was taking out the recyclables when he tripped and fell, injuring the left knee.  He did not hit his head or lose consciousness.  Denies neck and back pain.  No other injury from the fall.  Patient reports that he did receive IV pain medication in the ambulance but the pain is starting to get worse.        Past Medical History:  Diagnosis Date  . Gout   . Hypertension     Patient Active Problem List   Diagnosis Date Noted  . Rupture of left quadriceps tendon 08/09/2019  . Soft tissue mass 05/22/2017  . Gout 05/05/2016  . Hypertension 04/10/2011  . Colon polyp 04/10/2011    Past Surgical History:  Procedure Laterality Date  . COLONOSCOPY    . EXCISION MASS NECK N/A 05/24/2017   Procedure: EXCISION POSTERIOR NECK MASS;  Surgeon: Armandina Gemma, MD;  Location: [facility] OR;  Service: General;  Laterality: N/A;  . WISDOM TOOTH EXTRACTION         Family History  Problem Relation Age of Onset  . Cancer Mother        lymphoscarcoma  . Stroke Father   . Colon cancer Neg Hx   . Rectal cancer Neg Hx     Social History   Tobacco Use  . Smoking status: Former Smoker    Quit date: 05/10/1964    Years since quitting: 55.2  . Smokeless tobacco: Never Used  Vaping Use  . Vaping Use: Never used  Substance Use Topics  . Alcohol use: Yes    Alcohol/week: 21.0 standard drinks    Types: 21 Standard drinks or equivalent per week    Comment: wine with dinner  . Drug use: No    Home Medications Prior to Admission medications   Medication Sig Start Date End Date Taking? Authorizing Provider  albuterol (VENTOLIN HFA) 108 (90 Base) MCG/ACT inhaler INHALE 1-2 PUFFS INTO  THE LUNGS EVERY 6 (SIX) HOURS AS NEEDED FOR WHEEZING OR SHORTNESS OF BREATH. 03/10/19   Wendie Agreste, MD  allopurinol (ZYLOPRIM) 100 MG tablet TAKE 1 TABLET BY MOUTH EVERY DAY 08/09/19   Wendie Agreste, MD  amLODipine (NORVASC) 5 MG tablet TAKE 1 TABLET BY MOUTH EVERY DAY 08/09/19   Wendie Agreste, MD  doxazosin (CARDURA) 2 MG tablet TAKE 1 TABLET BY MOUTH EVERY DAY 07/13/19   Wendie Agreste, MD  lisinopril (ZESTRIL) 20 MG tablet TAKE 1 TABLET BY MOUTH EVERY DAY 08/04/19   Wendie Agreste, MD    Allergies    Patient has no known allergies.  Review of Systems   Review of Systems  Musculoskeletal: Positive for arthralgias.  All other systems reviewed and are negative.   Physical Exam Updated Vital Signs BP (!) 158/92 (BP Location: Right Arm)   Pulse 65   Temp 98.4 F (36.9 C) (Oral)   Resp 16   Ht 5\' 10"  (1.778 m)   Wt 88 kg   SpO2 95%   BMI 27.84 kg/m   Physical Exam Vitals and nursing note reviewed.  Constitutional:      General: He is not in acute distress.  Appearance: Normal appearance. He is well-developed.  HENT:     Head: Normocephalic and atraumatic.     Right Ear: Hearing normal.     Left Ear: Hearing normal.     Nose: Nose normal.  Eyes:     Conjunctiva/sclera: Conjunctivae normal.     Pupils: Pupils are equal, round, and reactive to light.  Cardiovascular:     Rate and Rhythm: Regular rhythm.     Heart sounds: S1 normal and S2 normal. No murmur heard.  No friction rub. No gallop.   Pulmonary:     Effort: Pulmonary effort is normal. No respiratory distress.     Breath sounds: Normal breath sounds.  Chest:     Chest wall: No tenderness.  Abdominal:     General: Bowel sounds are normal.     Palpations: Abdomen is soft.     Tenderness: There is no abdominal tenderness. There is no guarding or rebound. Negative signs include Murphy's sign and McBurney's sign.     Hernia: No hernia is present.  Musculoskeletal:     Cervical back: Normal range of  motion and neck supple.     Left knee: Swelling present. Decreased range of motion. Tenderness present. Abnormal patellar mobility.  Skin:    General: Skin is warm and dry.     Findings: No rash.  Neurological:     Mental Status: He is alert and oriented to person, place, and time.     GCS: GCS eye subscore is 4. GCS verbal subscore is 5. GCS motor subscore is 6.     Cranial Nerves: No cranial nerve deficit.     Sensory: No sensory deficit.     Coordination: Coordination normal.  Psychiatric:        Speech: Speech normal.        Behavior: Behavior normal.        Thought Content: Thought content normal.     ED Results / Procedures / Treatments   Labs (all labs ordered are listed, but only abnormal results are displayed) Labs Reviewed  SARS CORONAVIRUS 2 BY RT PCR (HOSPITAL ORDER, [place] [facility])  CBC  COMPREHENSIVE METABOLIC PANEL  HIV ANTIBODY (ROUTINE TESTING W REFLEX)    EKG None  Radiology DG Chest 2 View  Result Date: 08/10/2019 CLINICAL DATA:  Preop evaluation for upcoming quadriceps tendon repair EXAM: CHEST - 2 VIEW COMPARISON:  09/30/2018 FINDINGS: Cardiac shadow is within normal limits. The lungs are clear bilaterally. No acute bony abnormality is seen. IMPRESSION: No active cardiopulmonary disease. Electronically Signed   By: Inez Catalina M.D.   On: 08/10/2019 00:27   DG Knee Complete 4 Views Left  Result Date: 08/09/2019 CLINICAL DATA:  Recent fall with knee pain, initial encounter EXAM: LEFT KNEE - COMPLETE 4+ VIEW COMPARISON:  None. FINDINGS: Large suprapatellar joint effusion is noted. Soft tissue swelling is noted over the patella as well. No femoral or tibial fracture is seen. Small bony densities are noted superior to the patella which are suspicious for avulsion fractures. Possibility of patellar tendon rupture deserves consideration as well IMPRESSION: Large joint effusion. Considerable soft tissue swelling. Changes suspicious for small  avulsions from the superior aspect of the patella with possible patellar tendon rupture. Correlate with the clinical exam. MRI on a nonemergent basis would be helpful as well. Electronically Signed   By: Inez Catalina M.D.   On: 08/09/2019 23:04    Procedures Procedures (including critical care time)  Medications Ordered in ED Medications  acetaminophen (TYLENOL) tablet 325-650 mg (has no administration in time range)  oxyCODONE (Oxy IR/ROXICODONE) immediate release tablet 5-10 mg (has no administration in time range)  oxyCODONE (Oxy IR/ROXICODONE) immediate release tablet 10-15 mg (has no administration in time range)  HYDROmorphone (DILAUDID) injection 0.5-1 mg (has no administration in time range)  methocarbamol (ROBAXIN) tablet 500 mg (has no administration in time range)    Or  methocarbamol (ROBAXIN) 500 mg in dextrose 5 % 50 mL IVPB (has no administration in time range)  docusate sodium (COLACE) capsule 100 mg (has no administration in time range)  polyethylene glycol (MIRALAX / GLYCOLAX) packet 17 g (has no administration in time range)  bisacodyl (DULCOLAX) EC tablet 5 mg (has no administration in time range)  magnesium citrate solution 1 Bottle (has no administration in time range)  ondansetron (ZOFRAN) tablet 4 mg (has no administration in time range)    Or  ondansetron (ZOFRAN) injection 4 mg (has no administration in time range)  morphine 2 MG/ML injection 1 mg (has no administration in time range)  0.9 %  sodium chloride infusion (has no administration in time range)  traMADol (ULTRAM) tablet 50 mg (has no administration in time range)  HYDROmorphone (DILAUDID) injection 0.5 mg (0.5 mg Intravenous Given 08/10/19 0038)    ED Course  I have reviewed the triage vital signs and the nursing notes.  Pertinent labs & imaging results that were available during my care of the patient were reviewed by me and considered in my medical decision making (see chart for details).    MDM  Rules/Calculators/A&P                          Patient presents to the emergency department after a fall.  He has isolated left knee injury from the fall.  X-ray shows abnormal patella alignment and there is concern for quadricep tendon rupture.  He has been seen by Dr. Louanne Skye who will admit him to the hospital and plans for surgery in the morning.  Final Clinical Impression(s) / ED Diagnoses Final diagnoses:  Quadriceps tendon rupture, left, initial encounter    Rx / DC Orders ED Discharge Orders    None       Jalia Zuniga, Gwenyth Allegra, MD 08/10/19 [phone]

## 2019-08-10 NOTE — ED Notes (Signed)
Report called to [facility] on [facility]

## 2019-08-11 ENCOUNTER — Encounter (HOSPITAL_COMMUNITY): Payer: Self-pay | Admitting: Orthopedic Surgery

## 2019-08-11 NOTE — Evaluation (Signed)
Physical Therapy Evaluation Patient Details Name: Matthew Simon MRN: [id number] DOB: October 09, 1950 Today's Date: 08/11/2019   History of Present Illness  Pt fell and ruptured left quad tendon. Repair on 7/8.  PMH:  Gout , HTN, covid-19 earlier in year  Clinical Impression  Pt admitted with above diagnosis. Pt was able to ambulate with RW with good stability overall.  HAd some difficulty on stairs and has 14 at home therefore will practice again in pm.   Pt currently with functional limitations due to the deficits listed below (see PT Problem List). Pt will benefit from skilled PT to increase their independence and safety with mobility to allow discharge to the venue listed below.      Follow Up Recommendations Home health PT;Supervision/Assistance - 24 hour    Equipment Recommendations  Rolling walker with 5" wheels;3in1 (PT) (wants to get a RW for upstairs and downstairs?)    Recommendations for Other Services       Precautions / Restrictions Precautions Precautions: Fall Restrictions Weight Bearing Restrictions: Yes LLE Weight Bearing: Weight bearing as tolerated      Mobility  Bed Mobility Overal bed mobility: Independent             General bed mobility comments: incr time but able to complete with out aassist  Transfers Overall transfer level: Needs assistance Equipment used: Rolling walker (2 wheeled) Transfers: Sit to/from Stand Sit to Stand: Min guard;Supervision         General transfer comment: cues for hand placement and initial steadying assist needed.   Ambulation/Gait Ambulation/Gait assistance: Min guard;Min assist;+2 safety/equipment Gait Distance (Feet): 200 Feet Assistive device: Rolling walker (2 wheeled) Gait Pattern/deviations: Step-to pattern;Decreased step length - left;Decreased stance time - left;Decreased weight shift to left;Antalgic   Gait velocity interpretation: <1.31 ft/sec, indicative of household ambulator General Gait Details: Pt  was able to ambulate.  Occasional knee buckling left or instability but no LOB.  Pt walks moresteady the longer he walks.   Stairs Stairs: Yes Stairs assistance: Min assist;Min guard;+2 safety/equipment Stair Management: Backwards;With walker;Step to pattern Number of Stairs: 4 General stair comments: Needed cues for sequencing and assit to steady.   Wheelchair Mobility    Modified Rankin (Stroke Patients Only)       Balance Overall balance assessment: Needs assistance Sitting-balance support: No upper extremity supported;Feet supported Sitting balance-Leahy Scale: Fair     Standing balance support: Bilateral upper extremity supported;During functional activity Standing balance-Leahy Scale: Poor Standing balance comment: relies on UE support                             Pertinent Vitals/Pain Pain Assessment: Faces Faces Pain Scale: Hurts little more Pain Location: left knee Pain Descriptors / Indicators: Aching;Grimacing;Guarding Pain Intervention(s): Limited activity within patient's tolerance;Monitored during session;Repositioned    Home Living Family/patient expects to be discharged to:: Private residence Living Arrangements: Spouse/significant other Available Help at Discharge: Family;Available PRN/intermittently (works part time) Type of Home: House Home Access: Stairs to enter;Other (comment) (uses garage most often with no steps) Entrance Stairs-Rails: Right;Left;Can reach both Entrance Stairs-Number of Steps: 5 Home Layout: Multi-level;Bed/bath upstairs;1/2 bath on main level Home Equipment: None Additional Comments: Retired, 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; played tennis and golf    Prior Function Level of Independence: Independent               Hand Dominance   Dominant Hand: Right    Extremity/Trunk Assessment  Upper Extremity Assessment Upper Extremity Assessment: Defer to OT evaluation    Lower Extremity  Assessment Lower Extremity Assessment: LLE deficits/detail LLE Deficits / Details: Bledsoe brace in place LLE: Unable to fully assess due to immobilization    Cervical / Trunk Assessment Cervical / Trunk Assessment: Normal  Communication   Communication: No difficulties  Cognition Arousal/Alertness: Awake/alert Behavior During Therapy: WFL for tasks assessed/performed Overall Cognitive Status: Within Functional Limits for tasks assessed                                        General Comments      Exercises     Assessment/Plan    PT Assessment Patient needs continued PT services  PT Problem List Decreased mobility;Decreased activity tolerance;Decreased knowledge of use of DME;Decreased safety awareness;Decreased knowledge of precautions;Pain;Decreased balance       PT Treatment Interventions DME instruction;Gait training;Functional mobility training;Therapeutic activities;Therapeutic exercise;Balance training;Patient/family education;Stair training    PT Goals (Current goals can be found in the Care Plan section)  Acute Rehab PT Goals Patient Stated Goal: to get betteron steps PT Goal Formulation: With patient Time For Goal Achievement: 08/25/19 Potential to Achieve Goals: Good    Frequency Min 6X/week   Barriers to discharge        Co-evaluation               AM-PAC PT "6 Clicks" Mobility  Outcome Measure Help needed turning from your back to your side while in a flat bed without using bedrails?: None Help needed moving from lying on your back to sitting on the side of a flat bed without using bedrails?: None Help needed moving to and from a bed to a chair (including a wheelchair)?: A Lot Help needed standing up from a chair using your arms (e.g., wheelchair or bedside chair)?: A Lot Help needed to walk in hospital room?: A Little Help needed climbing 3-5 steps with a railing? : A Lot 6 Click Score: 17    End of Session Equipment Utilized  During Treatment: Gait belt Activity Tolerance: Patient tolerated treatment well Patient left: in chair;with call bell/phone within reach;with chair alarm set Nurse Communication: Mobility status PT Visit Diagnosis: Unsteadiness on feet (R26.81);Pain;Muscle weakness (generalized) (M62.81) Pain - Right/Left: Left Pain - part of body: Leg    Time: 5852-7782 PT Time Calculation (min) (ACUTE ONLY): 35 min   Charges:   PT Evaluation $PT Eval Moderate Complexity: 1 Mod PT Treatments $Gait Training: 8-22 mins        Philip Kotlyar W,PT Acute Rehabilitation Services Pager:  [phone]  Office:  [place] 08/11/2019, 12:15 PM

## 2019-08-11 NOTE — Progress Notes (Signed)
08/11/19 1500  PT Visit Information  Last PT Received On 08/11/19  Assistance Needed +2 (for safety)  History of Present Illness Pt fell and ruptured left quad tendon. Repair on 7/8.  PMH:  Gout , HTN, covid-19 earlier in year  Precautions  Precautions Fall  Restrictions  Weight Bearing Restrictions Yes  LLE Weight Bearing WBAT  Pain Assessment  Pain Assessment Faces  Faces Pain Scale 4  Pain Location left knee  Pain Descriptors / Indicators Aching;Grimacing;Guarding  Pain Intervention(s) Limited activity within patient's tolerance;Monitored during session;Repositioned;Patient requesting pain meds-RN notified  Cognition  Arousal/Alertness Awake/alert  Behavior During Therapy WFL for tasks assessed/performed  Overall Cognitive Status Within Functional Limits for tasks assessed  Bed Mobility  General bed mobility comments in chair on arrival  Transfers  Overall transfer level Needs assistance  Equipment used Rolling walker (2 wheeled)  Transfers Sit to/from Stand  Sit to Stand Min guard;Supervision  General transfer comment cues for hand placement and initial steadying assist needed.   Ambulation/Gait  Ambulation/Gait assistance Min guard;Min assist  Gait Distance (Feet) 200 Feet  Assistive device Rolling walker (2 wheeled)  Gait Pattern/deviations Step-to pattern;Decreased step length - left;Decreased stance time - left;Decreased weight shift to left;Antalgic  General Gait Details Pt was able to ambulate.  Occasional knee buckling left or instability but no LOB.  Pt walks moresteady the longer he walks.   Gait velocity interpretation <1.31 ft/sec, indicative of household ambulator  Stairs Yes  Stairs assistance Min assist;Min guard;+2 safety/equipment  Stair Management Backwards;With walker;Step to pattern  Number of Stairs 14  General stair comments Needed cues for sequencing and assit to steady. Took wife to stairs and showed her technique as well.  They feel more  confident about stairs now.   Balance  Overall balance assessment Needs assistance  Sitting-balance support No upper extremity supported;Feet supported  Sitting balance-Leahy Scale Fair  Standing balance support Bilateral upper extremity supported;During functional activity  Standing balance-Leahy Scale Poor  Standing balance comment relies on UE support  Exercises  Exercises General Lower Extremity  General Exercises - Lower Extremity  Ankle Circles/Pumps AROM;Both;10 reps;Supine  PT - End of Session  Equipment Utilized During Treatment Gait belt  Activity Tolerance Patient tolerated treatment well  Patient left in chair;with call bell/phone within reach;with chair alarm set  Nurse Communication Mobility status   PT - Assessment/Plan  PT Plan Current plan remains appropriate  PT Visit Diagnosis Unsteadiness on feet (R26.81);Pain;Muscle weakness (generalized) (M62.81)  Pain - Right/Left Left  Pain - part of body Leg  PT Frequency (ACUTE ONLY) Min 6X/week  Follow Up Recommendations Home health PT;Supervision/Assistance - 24 hour  PT equipment Rolling walker with 5" wheels  AM-PAC PT "6 Clicks" Mobility Outcome Measure (Version 2)  Help needed turning from your back to your side while in a flat bed without using bedrails? 4  Help needed moving from lying on your back to sitting on the side of a flat bed without using bedrails? 4  Help needed moving to and from a bed to a chair (including a wheelchair)? 3  Help needed standing up from a chair using your arms (e.g., wheelchair or bedside chair)? 3  Help needed to walk in hospital room? 3  Help needed climbing 3-5 steps with a railing?  3  6 Click Score 20  Consider Recommendation of Discharge To: Home with no services  PT Goal Progression  Progress towards PT goals Progressing toward goals  PT Time Calculation  PT Start  Time (ACUTE ONLY) 1408  PT Stop Time (ACUTE ONLY) 1445  PT Time Calculation (min) (ACUTE ONLY) 37 min  PT  General Charges  $$ ACUTE PT VISIT 1 Visit  PT Treatments  $Gait Training 8-22 mins  $Self Care/Home Management 8-22  Pt progressing and was able to go up and down 14 steps with min guard assist with RW.  Pt wife present and reviewed with her as well. Discussed safety around the  Home with pt and wife as well and they both are aware that pt should try to stay on one level of house during the day.  Pt and wife feel more confident about going home now.  Pt states MD said he would go home in am. Will have therapy check on pt in am to review mobility once more.  Damariz Paganelli W,PT Acute Rehabilitation Services Pager:  [phone]  Office:  [phone]

## 2019-08-11 NOTE — Progress Notes (Signed)
PT Note Pt needs to practice steps this pm. Will return to practice this at 1 pm.  Pt with difficulty with ambulation at times unsteady as well. Will continue PT this pm.   Full note to follow. Merline Perkin W,PT Acute Rehabilitation Services Pager:  [phone]  Office:  [phone]

## 2019-08-11 NOTE — Progress Notes (Signed)
  Subjective: Patient stable.  Needs 1-2 more therapy sessions to get stable enough to go home.  Anticipate discharge tomorrow.   Objective: Vital signs in last 24 hours: Temp:  [97.5 F (36.4 C)-98.5 F (36.9 C)] 98.3 F (36.8 C) (07/09 0814) Pulse Rate:  [31-78] 61 (07/09 0814) Resp:  [7-20] 18 (07/09 0814) BP: (110-168)/(67-93) 119/70 (07/09 0814) SpO2:  [93 %-100 %] 100 % (07/09 0814) Weight:  [88 kg] 88 kg (07/08 1537)  Intake/Output from previous day: 07/08 0701 - 07/09 0700 In: 3638.9 [P.O.:720; I.V.:2673.4; IV Piggyback:245.5] Out: 450 [Urine:450] Intake/Output this shift: Total I/O In: 240 [P.O.:240] Out: 850 [Urine:850]  Exam:  Dorsiflexion/Plantar flexion intact  Labs: Recent Labs    08/10/19 0248  HGB 11.9*   Recent Labs    08/10/19 0248  WBC 7.7  RBC 4.06*  HCT 37.4*  PLT 262   Recent Labs    08/10/19 0248  NA 138  K 3.7  CL 104  CO2 24  BUN 8  CREATININE 1.25*  GLUCOSE 139*  CALCIUM 9.1   No results for input(s): LABPT, INR in the last 72 hours.  Assessment/Plan: Plan at this time is therapy today.  Plan to let him go home tomorrow weightbearing as tolerated in the Bledsoe brace in full extension.  Aspirin for DVT prophylaxis.   Landry Dyke Mikel Pyon 08/11/2019, 12:20 PM

## 2019-08-11 NOTE — Plan of Care (Signed)

## 2019-08-11 NOTE — Anesthesia Postprocedure Evaluation (Signed)
Anesthesia Post Note  Patient: Matthew Simon  Procedure(s) Performed: REPAIR QUADRICEP TENDON RUPTURE (Left Knee)     Patient location during evaluation: PACU Anesthesia Type: General Level of consciousness: awake and alert Pain management: pain level controlled Vital Signs Assessment: post-procedure vital signs reviewed and stable Respiratory status: spontaneous breathing, nonlabored ventilation, respiratory function stable and patient connected to nasal cannula oxygen Cardiovascular status: blood pressure returned to baseline and stable Postop Assessment: no apparent nausea or vomiting Anesthetic complications: no   No complications documented.  Last Vitals:  Vitals:   08/11/19 0309 08/11/19 0814  BP: 110/70 119/70  Pulse: 62 61  Resp: 14 18  Temp: 36.6 C 36.8 C  SpO2: 97% 100%    Last Pain:  Vitals:   08/11/19 0814  TempSrc: Oral  PainSc: 0-No pain                 Bonniejean Piano S

## 2019-08-11 NOTE — Progress Notes (Signed)
Patient is POD1 s/p left quadriceps tendon repair.  He is doing well without significant pain.  He has a Bledsoe brace in place.  Plan for patient to be weightbearing as tolerated while using Bledsoe brace.  Avoid knee flexion.  Recommend physical therapy today to work on mobility.  As long as he does well with PT, he is cleared for discharge from orthopedic standpoint.  Aspirin 81 mg for DVT prophylaxis.  Follow-up with Dr. Marlou Sa in 1 week following surgical procedure.

## 2019-08-11 NOTE — TOC CAGE-AID Note (Signed)
Transition of Care [facility]) - CAGE-AID Screening   Patient Details  Name: Matthew Simon MRN: [id number] Date of Birth: August 29, 1950  [facility] CM/SW Contact:    Emeterio Reeve, [place] Phone Number: 08/11/2019, 1:27 PM   Clinical Narrative:  CSW met with pt at bedside. CSW introduced self and explained her role at the hospital. Pt reported that he has 2 glasses of wine every night. Pt inquired if he should cut back. CSW reccommended pt to discuss concerns with his pcp. Pt denied substance use.   Pt was receptive to educational materials surrounding alcohol consumption.   CAGE-AID Screening:    Have You Ever Felt You Ought to Cut Down on Your Drinking or Drug Use?: No Have People Annoyed You By Critizing Your Drinking Or Drug Use?: No Have You Felt Bad Or Guilty About Your Drinking Or Drug Use?: No Have You Ever Had a Drink or Used Drugs First Thing In The Morning to STeady Your Nerves or to Get Rid of a Hangover?: No CAGE-AID Score: 0  Substance Abuse Education Offered: Yes  Substance abuse interventions: Patient Counseling, Educational Materials  Emeterio Reeve, Latanya Presser, [place] Social Worker [phone]

## 2019-08-12 MED ORDER — OXYCODONE HCL 5 MG PO TABS: 5.0000 mg | ORAL_TABLET | ORAL | 0 refills | Status: DC | PRN

## 2019-08-12 MED ORDER — ACETAMINOPHEN 325 MG PO TABS: 325.0000 mg | ORAL_TABLET | Freq: Four times a day (QID) | ORAL | 0 refills | Status: DC | PRN

## 2019-08-12 MED ORDER — METHOCARBAMOL 500 MG PO TABS: 500.0000 mg | ORAL_TABLET | Freq: Three times a day (TID) | ORAL | 0 refills | Status: DC | PRN

## 2019-08-12 MED ORDER — ASPIRIN 81 MG PO CHEW: 81.0000 mg | CHEWABLE_TABLET | Freq: Two times a day (BID) | ORAL | 0 refills | Status: DC

## 2019-08-12 NOTE — Progress Notes (Signed)
Physical Therapy Treatment Patient Details Name: Matthew Simon MRN: [id number] DOB: Apr 13, 1950 Today's Date: 08/12/2019    History of Present Illness Pt fell and ruptured left quad tendon. Repair on 7/8.  PMH:  Gout , HTN, covid-19 earlier in year    PT Comments    Pt in bed with wife present, agreeable to session with focus on stair training and education prior to anticipated d/c home. The pt was able to complete all bed mobility and transfers with good independence, and improvements in stability with ambulation and stairs. The pt and his wife were educated in stair navigation, car transfers, energy conservation techniques, and home safety and were agreeable to all education, no further questions at this time. The pt will continue to benefit from skilled PT to further progress functional strength, stability, and endurance, but is safe to return home with assist from his wife while awaiting home therapies.    Follow Up Recommendations  Home health PT;Supervision/Assistance - 24 hour     Equipment Recommendations  Rolling walker with 5" wheels;3in1 (PT)    Recommendations for Other Services       Precautions / Restrictions Precautions Precautions: Fall Required Braces or Orthoses: Knee Immobilizer - Left Knee Immobilizer - Left: Other (comment) (hinged brace, locked in ext) Restrictions Weight Bearing Restrictions: Yes LLE Weight Bearing: Weight bearing as tolerated    Mobility  Bed Mobility Overal bed mobility: Independent             General bed mobility comments: pt able to mobilize to EOB without assist, some extra time  Transfers Overall transfer level: Needs assistance Equipment used: Rolling walker (2 wheeled) Transfers: Sit to/from Stand Sit to Stand: Supervision         General transfer comment: minG for initial steadying, pt able to power up from low bed without assist  Ambulation/Gait Ambulation/Gait assistance: Min guard Gait Distance (Feet): 50  Feet (+ 50 ft) Assistive device: Rolling walker (2 wheeled) Gait Pattern/deviations: Step-to pattern;Decreased step length - left;Decreased stance time - left;Decreased weight shift to left;Antalgic Gait velocity: 0.3 m/s Gait velocity interpretation: <1.31 ft/sec, indicative of household ambulator General Gait Details: pt able to ambulate to gym and back with slow but stedy antalgic gait with decreased wt acceptance to LLE, no LOB or knee buckling today   Stairs Stairs: Yes Stairs assistance: Min guard Stair Management: Backwards;With walker;Step to pattern Number of Stairs: 2 General stair comments: pt asked to review stairs, minG for safety, no assist needed with RW management. Pt and wife able to talk through technique without assist from PT, improved confidence in stairs          Balance Overall balance assessment: Needs assistance Sitting-balance support: No upper extremity supported;Feet supported Sitting balance-Leahy Scale: Fair     Standing balance support: Bilateral upper extremity supported;During functional activity Standing balance-Leahy Scale: Poor Standing balance comment: relies on UE support                            Cognition Arousal/Alertness: Awake/alert Behavior During Therapy: WFL for tasks assessed/performed Overall Cognitive Status: Within Functional Limits for tasks assessed                                 General Comments: Pt and his wife verbalize understanding with all education      Exercises      General Comments General  comments (skin integrity, edema, etc.): Educated on stair training, energy conservation, home managment to reduce fall risk (rolling up rugs), and car transfer      Pertinent Vitals/Pain Pain Assessment: Faces Faces Pain Scale: Hurts a little bit Pain Location: left knee Pain Descriptors / Indicators: Aching;Grimacing;Guarding Pain Intervention(s): Limited activity within patient's  tolerance;Monitored during session           PT Goals (current goals can now be found in the care plan section) Acute Rehab PT Goals Patient Stated Goal: to get betteron steps PT Goal Formulation: With patient Time For Goal Achievement: 08/25/19 Potential to Achieve Goals: Good Progress towards PT goals: Progressing toward goals    Frequency    Min 5X/week      PT Plan Current plan remains appropriate       AM-PAC PT "6 Clicks" Mobility   Outcome Measure  Help needed turning from your back to your side while in a flat bed without using bedrails?: None Help needed moving from lying on your back to sitting on the side of a flat bed without using bedrails?: None Help needed moving to and from a bed to a chair (including a wheelchair)?: A Little Help needed standing up from a chair using your arms (e.g., wheelchair or bedside chair)?: A Little Help needed to walk in hospital room?: A Little Help needed climbing 3-5 steps with a railing? : A Little 6 Click Score: 20    End of Session Equipment Utilized During Treatment: Gait belt Activity Tolerance: Patient tolerated treatment well Patient left: with call bell/phone within reach;in bed;with nursing/sitter in room;with family/visitor present Nurse Communication: Mobility status PT Visit Diagnosis: Unsteadiness on feet (R26.81);Pain;Muscle weakness (generalized) (M62.81) Pain - Right/Left: Left Pain - part of body: Leg     Time: 1130-1200 PT Time Calculation (min) (ACUTE ONLY): 30 min  Charges:  $Gait Training: 23-37 mins                     Karma Ganja, PT, DPT   Acute Rehabilitation Department Pager #: [phone]   Otho Bellows 08/12/2019, 12:32 PM

## 2019-08-12 NOTE — Progress Notes (Signed)
Patient stable Pain controlled. Plan for discharge today.  Weightbearing as tolerated in the Bledsoe brace locked in extension

## 2019-08-12 NOTE — Progress Notes (Signed)
Pt given discharge instructions and gone over with him. He verbalized understanding. All questions answered. 3N1 and walker delivered to room. Wife transporting home.

## 2019-08-12 NOTE — Care Management (Signed)
Patient requests RW and 3n1.  DME discussed with patient and ordered from [facility] to be delivered to room prior to d/c.

## 2019-08-12 NOTE — Plan of Care (Signed)
  Problem: Health Behavior/Discharge Planning: Goal: Ability to manage health-related needs will improve Outcome: Adequate for Discharge   Problem: Pain Managment: Goal: General experience of comfort will improve Outcome: Adequate for Discharge   Problem: Safety: Goal: Ability to remain free from injury will improve Outcome: Adequate for Discharge

## 2019-08-17 ENCOUNTER — Ambulatory Visit: Payer: Medicare HMO | Admitting: Orthopedic Surgery

## 2019-08-17 DIAGNOSIS — S76112A Strain of left quadriceps muscle, fascia and tendon, initial encounter: Secondary | ICD-10-CM

## 2019-08-19 ENCOUNTER — Encounter: Payer: Self-pay | Admitting: Orthopedic Surgery

## 2019-08-19 NOTE — Progress Notes (Signed)
   Post-Op Visit Note   Patient: Matthew Simon           Date of Birth: 18-May-1950           MRN: [id number] Visit Date: 08/17/2019 PCP: Wendie Agreste, MD   Assessment & Plan:  Chief Complaint:  Chief Complaint  Patient presents with  . Left Knee - Routine Post Op   Visit Diagnoses:  1. Rupture of left quadriceps tendon, initial encounter     Plan: Patient is a 69 year old male who presents s/p left knee quadricep tendon rupture repair on 08/10/2019. He is about 1 week out from procedure. He is ambulating with a walker and a Bledsoe brace that is locked in extension. He is doing okay overall. He takes Robaxin, hydrocodone, Tylenol. He notes pain is worse in the morning. He is also taking aspirin twice a day for DVT prophylaxis. Denies any fevers, chills, drainage. He does take the narcotic occasionally but tries to avoid taking it if he can. Incision is healing well on exam. No calf tenderness and a negative Homans' sign. He is unable to do a straight leg raise at this point. He does have pain with passive flexion even to about 15 degrees. Sutures removed today and replaced with Steri-Strips. Plan to follow-up in 2 weeks for clinical recheck. At that point, we will begin to work on flexion of the left knee. In the meantime, continue ambulating with Bledsoe brace locked in extension and keeps knee straight. Patient understands and will follow up in 2 weeks.  Follow-Up Instructions: No follow-ups on file.   Orders:  No orders of the defined types were placed in this encounter.  No orders of the defined types were placed in this encounter.   Imaging: No results found.  PMFS History: Patient Active Problem List   Diagnosis Date Noted  . Rupture of left quadriceps tendon 08/09/2019  . Soft tissue mass 05/22/2017  . Gout 05/05/2016  . Hypertension 04/10/2011  . Colon polyp 04/10/2011   Past Medical History:  Diagnosis Date  . Gout   . Hypertension     Family History    Problem Relation Age of Onset  . Cancer Mother        lymphoscarcoma  . Stroke Father   . Colon cancer Neg Hx   . Rectal cancer Neg Hx     Past Surgical History:  Procedure Laterality Date  . COLONOSCOPY    . EXCISION MASS NECK N/A 05/24/2017   Procedure: EXCISION POSTERIOR NECK MASS;  Surgeon: Armandina Gemma, MD;  Location: [facility];  Service: General;  Laterality: N/A;  . QUADRICEPS TENDON REPAIR Left 08/10/2019   Procedure: REPAIR QUADRICEP TENDON RUPTURE;  Surgeon: Meredith Pel, MD;  Location: [facility];  Service: Orthopedics;  Laterality: Left;  . WISDOM TOOTH EXTRACTION     Social History   Occupational History  . Occupation: business development  Tobacco Use  . Smoking status: Former Smoker    Quit date: 05/10/1964    Years since quitting: 55.3  . Smokeless tobacco: Never Used  Vaping Use  . Vaping Use: Never used  Substance and Sexual Activity  . Alcohol use: Yes    Alcohol/week: 21.0 standard drinks    Types: 21 Standard drinks or equivalent per week    Comment: wine with dinner  . Drug use: No  . Sexual activity: Not on file

## 2019-08-21 ENCOUNTER — Other Ambulatory Visit: Payer: Self-pay | Admitting: Surgical

## 2019-08-21 ENCOUNTER — Telehealth: Payer: Self-pay

## 2019-08-21 MED ORDER — OXYCODONE HCL 5 MG PO TABS: 5.0000 mg | ORAL_TABLET | Freq: Four times a day (QID) | ORAL | 0 refills | Status: DC | PRN

## 2019-08-21 NOTE — Telephone Encounter (Signed)
Pls advise.  

## 2019-08-21 NOTE — Telephone Encounter (Signed)
Patient would like a Rx refill on Oxycodone?  Cb# [phone].  Please advise.  Thank you.

## 2019-08-24 NOTE — Discharge Summary (Signed)
Physician Discharge Summary      Patient ID: Matthew Simon MRN: [id number] DOB/AGE: [birth year] 70 y.o.  Admit date: 08/09/2019 Discharge date: 08/12/2019  Admission Diagnoses:  Principal Problem:   Rupture of left quadriceps tendon   Discharge Diagnoses:  Same  Surgeries: Procedure(s): REPAIR QUADRICEP TENDON RUPTURE on 08/10/2019   Consultants: Treatment Team:  Meredith Pel, MD  Discharged Condition: [facility] Course: Matthew Simon is an 69 y.o. male who was admitted 08/09/2019 with a chief complaint of left knee pain, and found to have a diagnosis of left quad tendon rupture.  They were brought to the operating room on 08/10/2019 and underwent the above named procedures.  Pt awoke from anesthesia without complication and was transferred to the floor. On POD1, patient's pain was controlled. He was able to progress with mobility over the course of his stay to the point he was discharged home on POD3.  WBAT in knee immobilizer at all times with ambulation.  Pt will f/u with Dr. Marlou Sa in clinic in ~2 weeks.   Antibiotics given:  Anti-infectives (From admission, onward)   Start     Dose/Rate Route Frequency Ordered Stop   08/11/19 0600  ceFAZolin (ANCEF) IVPB 2g/100 mL premix        2 g 200 mL/hr over 30 Minutes Intravenous On call to O.R. 08/10/19 1329 08/10/19 1735   08/11/19 0130  ceFAZolin (ANCEF) IVPB 2g/100 mL premix        2 g 200 mL/hr over 30 Minutes Intravenous Every 8 hours 08/10/19 2100 08/11/19 1336    .  Recent vital signs:  Vitals:   08/12/19 0351 08/12/19 0917  BP: (!) 152/82 (!) 155/82  Pulse: 76 74  Resp: 18 16  Temp: 97.8 F (36.6 C) 98.2 F (36.8 C)  SpO2: 95% 99%    Recent laboratory studies:  Results for orders placed or performed during the hospital encounter of 08/09/19  SARS Coronavirus 2 by RT PCR (hospital order, performed in [place] hospital lab) Nasopharyngeal Nasopharyngeal Swab   Specimen: Nasopharyngeal Swab  Result Value  Ref Range   SARS Coronavirus 2 NEGATIVE NEGATIVE  Surgical PCR screen   Specimen: Nasal Mucosa; Nasal Swab  Result Value Ref Range   MRSA, PCR NEGATIVE NEGATIVE   Staphylococcus aureus POSITIVE (A) NEGATIVE  CBC  Result Value Ref Range   WBC 7.7 4.0 - 10.5 K/uL   RBC 4.06 (L) 4.22 - 5.81 MIL/uL   Hemoglobin 11.9 (L) 13.0 - 17.0 g/dL   HCT 37.4 (L) 39 - 52 %   MCV 92.1 80.0 - 100.0 fL   MCH 29.3 26.0 - 34.0 pg   MCHC 31.8 30.0 - 36.0 g/dL   RDW 12.1 11.5 - 15.5 %   Platelets 262 150 - 400 K/uL   nRBC 0.0 0.0 - 0.2 %  Comprehensive metabolic panel  Result Value Ref Range   Sodium 138 135 - 145 mmol/L   Potassium 3.7 3.5 - 5.1 mmol/L   Chloride 104 98 - 111 mmol/L   CO2 24 22 - 32 mmol/L   Glucose, Bld 139 (H) 70 - 99 mg/dL   BUN 8 8 - 23 mg/dL   Creatinine, Ser 1.25 (H) 0.61 - 1.24 mg/dL   Calcium 9.1 8.9 - 10.3 mg/dL   Total Protein 6.6 6.5 - 8.1 g/dL   Albumin 3.9 3.5 - 5.0 g/dL   AST 25 15 - 41 U/L   ALT 16 0 - 44 U/L   Alkaline  Phosphatase 43 38 - 126 U/L   Total Bilirubin 0.8 0.3 - 1.2 mg/dL   GFR calc non Af Amer 58 (L) >60 mL/min   GFR calc Af Amer >60 >60 mL/min   Anion gap 10 5 - 15  HIV Antibody (routine testing w rflx)  Result Value Ref Range   HIV Screen 4th Generation wRfx Non Reactive Non Reactive    Discharge Medications:   Allergies as of 08/12/2019   No Known Allergies     Medication List    TAKE these medications   acetaminophen 325 MG tablet Commonly known as: TYLENOL Take 1-2 tablets (325-650 mg total) by mouth every 6 (six) hours as needed for mild pain (pain score 1-3 or temp > 100.5).   albuterol 108 (90 Base) MCG/ACT inhaler Commonly known as: VENTOLIN HFA INHALE 1-2 PUFFS INTO THE LUNGS EVERY 6 (SIX) HOURS AS NEEDED FOR WHEEZING OR SHORTNESS OF BREATH.   allopurinol 100 MG tablet Commonly known as: ZYLOPRIM TAKE 1 TABLET BY MOUTH EVERY DAY   amLODipine 5 MG tablet Commonly known as: NORVASC TAKE 1 TABLET BY MOUTH EVERY DAY     aspirin 81 MG chewable tablet Chew 1 tablet (81 mg total) by mouth in the morning and at bedtime.   doxazosin 2 MG tablet Commonly known as: CARDURA TAKE 1 TABLET BY MOUTH EVERY DAY   lisinopril 20 MG tablet Commonly known as: ZESTRIL TAKE 1 TABLET BY MOUTH EVERY DAY   methocarbamol 500 MG tablet Commonly known as: ROBAXIN Take 1 tablet (500 mg total) by mouth every 8 (eight) hours as needed for muscle spasms.   tamsulosin 0.4 MG Caps capsule Commonly known as: FLOMAX Take 0.4 mg by mouth at bedtime.   vitamin B-12 1000 MCG tablet Commonly known as: CYANOCOBALAMIN Take 1,000 mcg by mouth daily.       Diagnostic Studies: DG Chest 2 View  Result Date: 08/10/2019 CLINICAL DATA:  Preop evaluation for upcoming quadriceps tendon repair EXAM: CHEST - 2 VIEW COMPARISON:  09/30/2018 FINDINGS: Cardiac shadow is within normal limits. The lungs are clear bilaterally. No acute bony abnormality is seen. IMPRESSION: No active cardiopulmonary disease. Electronically Signed   By: Inez Catalina M.D.   On: 08/10/2019 00:27   MR KNEE LEFT WO CONTRAST  Result Date: 08/10/2019 CLINICAL DATA:  Left knee pain and swelling after fall EXAM: MRI OF THE LEFT KNEE WITHOUT CONTRAST TECHNIQUE: Multiplanar, multisequence MR imaging of the knee was performed. No intravenous contrast was administered. COMPARISON:  X-ray 08/09/2019 FINDINGS: MENISCI Medial meniscus:  Intact. Lateral meniscus:  Intact. LIGAMENTS Cruciates:  Intact ACL and PCL. Collaterals: Medial collateral ligament is intact. Lateral collateral ligament complex is intact. CARTILAGE Patellofemoral: No patellar chondral defect. Focal near full-thickness chondral fissure of the medial trochlea (series 13, image 17). Medial:  No chondral defect. Lateral: Small partial thickness chondral fissure involving the central lateral tibial plateau (series 13, image 25). Joint: Large complex knee joint effusion/hemarthrosis. Hoffa's fat within normal limits.  Popliteal Fossa:  No Baker's cyst.  Intact popliteus tendon. Extensor Mechanism: Complete rupture of the distal quadriceps tendon where there is approximately 2-3 cm of tendinous retraction. Laxity of the patellar tendon without significant tendinosis or tear. Bones: The suspected avulsion fragments on prior radiograph are not well demonstrated by MRI. No appreciable marrow edema within the superior patellar pole. There is patella baja alignment. Marrow signal of the visualized femur, tibia, and fibula are within normal limits. Other: Marked fluid and hemorrhage within the prepatellar soft  tissues and the soft tissues surrounding the medial and lateral aspects of the knee. IMPRESSION: 1. Complete rupture of the distal quadriceps tendon with approximately 2-3 cm of tendinous retraction. The suspected avulsion fragments on prior radiograph are not well demonstrated by MRI. 2. Large complex knee joint effusion/hemarthrosis. Extensive prepatellar fluid and hemorrhage. 3. Focal chondral fissures involving the medial trochlea and lateral tibial plateau. 4. Intact menisci, cruciate and collateral ligaments. Electronically Signed   By: Davina Poke D.O.   On: 08/10/2019 13:37   DG Knee Complete 4 Views Left  Result Date: 08/09/2019 CLINICAL DATA:  Recent fall with knee pain, initial encounter EXAM: LEFT KNEE - COMPLETE 4+ VIEW COMPARISON:  None. FINDINGS: Large suprapatellar joint effusion is noted. Soft tissue swelling is noted over the patella as well. No femoral or tibial fracture is seen. Small bony densities are noted superior to the patella which are suspicious for avulsion fractures. Possibility of patellar tendon rupture deserves consideration as well IMPRESSION: Large joint effusion. Considerable soft tissue swelling. Changes suspicious for small avulsions from the superior aspect of the patella with possible patellar tendon rupture. Correlate with the clinical exam. MRI on a nonemergent basis would be  helpful as well. Electronically Signed   By: Inez Catalina M.D.   On: 08/09/2019 23:04    Disposition: Discharge disposition: 01-Home or Self Care       Discharge Instructions    Call MD / Call 911   Complete by: As directed    If you experience chest pain or shortness of breath, CALL 911 and be transported to the hospital emergency room.  If you develope a fever above 101 F, pus (white drainage) or increased drainage or redness at the wound, or calf pain, call your surgeon's office.   Call MD / Call 911   Complete by: As directed    If you experience chest pain or shortness of breath, CALL 911 and be transported to the hospital emergency room.  If you develope a fever above 101 F, pus (white drainage) or increased drainage or redness at the wound, or calf pain, call your surgeon's office.   Constipation Prevention   Complete by: As directed    Drink plenty of fluids.  Prune juice may be helpful.  You may use a stool softener, such as Colace (over the counter) 100 mg twice a day.  Use MiraLax (over the counter) for constipation as needed.   Constipation Prevention   Complete by: As directed    Drink plenty of fluids.  Prune juice may be helpful.  You may use a stool softener, such as Colace (over the counter) 100 mg twice a day.  Use MiraLax (over the counter) for constipation as needed.   Diet - low sodium heart healthy   Complete by: As directed    Diet - low sodium heart healthy   Complete by: As directed    Discharge instructions   Complete by: As directed    Weight bear as tolerated on the operative extremity with bledsoe brace.  Avoid bending your knee.  Remove Ace-wrap later tonight.  You may shower, shower chair is recommended with keeping the leg straight during shower.  Avoid bath/pool/submersion under water.  No tennis for now or any other strenuous physical activity.  Call the office at [phone] to schedule appointment with Dr. Marlou Sa for one week out from your procedure.   Call the office with any questions or concerns.   Discharge instructions   Complete by: As  directed    Keep left leg straight Okay to weight-bear as tolerated in the brace locked in extension Okay to shower.  Dressing is waterproof Return to clinic next Friday for recheck   Increase activity slowly as tolerated   Complete by: As directed    Increase activity slowly as tolerated   Complete by: As directed          Signed: Donella Stade 08/24/2019, 10:28 AM

## 2019-08-31 ENCOUNTER — Ambulatory Visit (INDEPENDENT_AMBULATORY_CARE_PROVIDER_SITE_OTHER): Payer: Medicare HMO | Admitting: Orthopedic Surgery

## 2019-08-31 ENCOUNTER — Encounter: Payer: Self-pay | Admitting: Orthopedic Surgery

## 2019-08-31 DIAGNOSIS — S76112A Strain of left quadriceps muscle, fascia and tendon, initial encounter: Secondary | ICD-10-CM

## 2019-08-31 MED ORDER — METHOCARBAMOL 500 MG PO TABS: 500.0000 mg | ORAL_TABLET | Freq: Three times a day (TID) | ORAL | 0 refills | Status: DC | PRN

## 2019-08-31 MED ORDER — ACETAMINOPHEN 325 MG PO TABS: 325.0000 mg | ORAL_TABLET | Freq: Four times a day (QID) | ORAL | 0 refills | Status: DC | PRN

## 2019-08-31 NOTE — Progress Notes (Signed)
Post-Op Visit Note   Patient: Matthew Simon           Date of Birth: 15-Oct-1950           MRN: [id number] Visit Date: 08/31/2019 PCP: Wendie Agreste, MD   Assessment & Plan:  Chief Complaint:  Chief Complaint  Patient presents with  . Left Knee - Routine Post Op   Visit Diagnoses:  1. Rupture of left quadriceps tendon, initial encounter     Plan: [name] is a patient is now 3 weeks out left quad tendon repair.  On exam he is able to do a straight leg raise.  Incision is intact.  Quad tendon collagen highway without defect to the superior pole of the patella.  He has been weightbearing in a Bledsoe brace.  No calf tenderness negative Homans today.  Plan at this time is to change that brace from full extension to 20 degrees of flexion.  I want him to continue to do straight leg raises but in the brace.  Okay to increase the brace from 20 degrees to 30 degrees in 10 days.  I will see him back in 3 weeks and we will likely initiate some physical therapy at that time which would include blood flow restriction treatment to improve quad strength.  We will let him progress his range of motion at that time as well in therapy but not beyond 90 degrees for the first 2 weeks.  Of therapy.  Follow-Up Instructions: Return in about 3 weeks (around 09/21/2019).   Orders:  No orders of the defined types were placed in this encounter.  Meds ordered this encounter  Medications  . acetaminophen (TYLENOL) 325 MG tablet    Sig: Take 1-2 tablets (325-650 mg total) by mouth every 6 (six) hours as needed for mild pain (pain score 1-3 or temp > 100.5).    Dispense:  60 tablet    Refill:  0  . methocarbamol (ROBAXIN) 500 MG tablet    Sig: Take 1 tablet (500 mg total) by mouth every 8 (eight) hours as needed for muscle spasms.    Dispense:  30 tablet    Refill:  0    Imaging: No results found.  PMFS History: Patient Active Problem List   Diagnosis Date Noted  . Rupture of left quadriceps tendon  08/09/2019  . Soft tissue mass 05/22/2017  . Gout 05/05/2016  . Hypertension 04/10/2011  . Colon polyp 04/10/2011   Past Medical History:  Diagnosis Date  . Gout   . Hypertension     Family History  Problem Relation Age of Onset  . Cancer Mother        lymphoscarcoma  . Stroke Father   . Colon cancer Neg Hx   . Rectal cancer Neg Hx     Past Surgical History:  Procedure Laterality Date  . COLONOSCOPY    . EXCISION MASS NECK N/A 05/24/2017   Procedure: EXCISION POSTERIOR NECK MASS;  Surgeon: Armandina Gemma, MD;  Location: [facility];  Service: General;  Laterality: N/A;  . QUADRICEPS TENDON REPAIR Left 08/10/2019   Procedure: REPAIR QUADRICEP TENDON RUPTURE;  Surgeon: Meredith Pel, MD;  Location: [place];  Service: Orthopedics;  Laterality: Left;  . WISDOM TOOTH EXTRACTION     Social History   Occupational History  . Occupation: business development  Tobacco Use  . Smoking status: Former Smoker    Quit date: 05/10/1964    Years since quitting: 55.3  . Smokeless tobacco: Never  Used  Vaping Use  . Vaping Use: Never used  Substance and Sexual Activity  . Alcohol use: Yes    Alcohol/week: 21.0 standard drinks    Types: 21 Standard drinks or equivalent per week    Comment: wine with dinner  . Drug use: No  . Sexual activity: Not on file

## 2019-09-03 ENCOUNTER — Other Ambulatory Visit: Payer: Self-pay | Admitting: Family Medicine

## 2019-09-03 DIAGNOSIS — I10 Essential (primary) hypertension: Secondary | ICD-10-CM

## 2019-09-03 NOTE — Telephone Encounter (Signed)
Requested medication (s) are due for refill today: yes  Requested medication (s) are on the active medication list: yes  Last refill:  lisinopril: 08/04/19   amlodipine: 08/09/19  Future visit scheduled: no  Notes to clinic:  [facility] no longer makes appt for [place]   Requested Prescriptions  Pending Prescriptions Disp Refills   lisinopril (ZESTRIL) 20 MG tablet [Pharmacy Med Name: LISINOPRIL 20 MG TABLET] 30 tablet 0    Sig: TAKE 1 TABLET BY MOUTH EVERY DAY      Cardiovascular:  ACE Inhibitors Failed - 09/03/2019  1:31 PM      Failed - Cr in normal range and within 180 days    Creat  Date Value Ref Range Status  02/21/2015 1.28 (H) 0.70 - 1.25 mg/dL Final   Creatinine, Ser  Date Value Ref Range Status  08/10/2019 1.25 (H) 0.61 - 1.24 mg/dL Final          Failed - Last BP in normal range    BP Readings from Last 1 Encounters:  08/12/19 (!) 155/82          Failed - Valid encounter within last 6 months    Recent Outpatient Visits           8 months ago Urinary hesitancy   [facility], Ranell Patrick, MD   9 months ago Urinary hesitancy   [facility], Ranell Patrick, MD   11 months ago COVID-19   [facility], Ranell Patrick, MD   11 months ago Cough   [facility], Ranell Patrick, MD   11 months ago Cough   [facility], Ranell Patrick, MD       Future Appointments             In 1 week Marlou Sa, Tonna Corner, MD [place] in normal range and within 180 days    Potassium  Date Value Ref Range Status  08/10/2019 3.7 3.5 - 5.1 mmol/L Final          Passed - Patient is not pregnant        amLODipine (NORVASC) 5 MG tablet [Pharmacy Med Name: AMLODIPINE BESYLATE 5 MG TAB] 30 tablet 0    Sig: TAKE 1 TABLET BY MOUTH EVERY DAY      Cardiovascular:  Calcium Channel Blockers Failed - 09/03/2019  1:31 PM      Failed - Last BP in normal range    BP Readings from Last 1  Encounters:  08/12/19 (!) 155/82          Failed - Valid encounter within last 6 months    Recent Outpatient Visits           8 months ago Urinary hesitancy   [facility], MD   9 months ago Urinary hesitancy   [facility], Ranell Patrick, MD   11 months ago COVID-19   [facility], Ranell Patrick, MD   11 months ago Cough   [facility], Ranell Patrick, MD   11 months ago Cough   [facility], Ranell Patrick, MD       Future Appointments             In 1 week Marlou Sa, Tonna Corner, MD [facility]

## 2019-09-04 NOTE — Telephone Encounter (Signed)
No further refills without office visit. Please schedule appt for refills

## 2019-09-04 NOTE — Telephone Encounter (Signed)
LVM informing pt. Of need for pt. To schedule an appt. For any further refills.

## 2019-09-14 ENCOUNTER — Other Ambulatory Visit: Payer: Self-pay

## 2019-09-14 ENCOUNTER — Encounter: Payer: Self-pay | Admitting: Orthopedic Surgery

## 2019-09-14 ENCOUNTER — Ambulatory Visit (INDEPENDENT_AMBULATORY_CARE_PROVIDER_SITE_OTHER): Payer: Medicare HMO | Admitting: Orthopedic Surgery

## 2019-09-14 DIAGNOSIS — S76112A Strain of left quadriceps muscle, fascia and tendon, initial encounter: Secondary | ICD-10-CM

## 2019-09-15 ENCOUNTER — Encounter: Payer: Self-pay | Admitting: Orthopedic Surgery

## 2019-09-15 NOTE — Progress Notes (Signed)
   Post-Op Visit Note   Patient: Matthew Simon           Date of Birth: 25-Feb-1950           MRN: [id number] Visit Date: 09/14/2019 PCP: Wendie Agreste, MD   Assessment & Plan:  Chief Complaint:  Chief Complaint  Patient presents with  . Left Leg - Pain   Visit Diagnoses:  1. Rupture of left quadriceps tendon, initial encounter     Plan: Matthew Simon is a patient who is now about 4 weeks out quad tendon rupture repair on the left.  He has been in a Bledsoe brace.  Theoretically should have been 0- 30 degrees on the brace.  Has been weightbearing.  On exam he can do straight leg raises.  Quad repair feels solid.  I would like him to start therapy here 1 time a week for 2 weeks for quad activation exercises and blood flow restriction treatment.  I am going to set that brace to 0 to 60 degrees.  We will see him back in about 2-1/2 weeks we will change it to 0-90 for 1 week to 2 weeks and then have him discontinue the brace.  New stockinette provided.  No calf tenderness negative Homans today.  Follow-Up Instructions: No follow-ups on file.   Orders:  Orders Placed This Encounter  Procedures  . Ambulatory referral to Physical Therapy   No orders of the defined types were placed in this encounter.   Imaging: No results found.  PMFS History: Patient Active Problem List   Diagnosis Date Noted  . Rupture of left quadriceps tendon 08/09/2019  . Soft tissue mass 05/22/2017  . Gout 05/05/2016  . Hypertension 04/10/2011  . Colon polyp 04/10/2011   Past Medical History:  Diagnosis Date  . Gout   . Hypertension     Family History  Problem Relation Age of Onset  . Cancer Mother        lymphoscarcoma  . Stroke Father   . Colon cancer Neg Hx   . Rectal cancer Neg Hx     Past Surgical History:  Procedure Laterality Date  . COLONOSCOPY    . EXCISION MASS NECK N/A 05/24/2017   Procedure: EXCISION POSTERIOR NECK MASS;  Surgeon: Armandina Gemma, MD;  Location: [facility];  Service: General;   Laterality: N/A;  . QUADRICEPS TENDON REPAIR Left 08/10/2019   Procedure: REPAIR QUADRICEP TENDON RUPTURE;  Surgeon: Meredith Pel, MD;  Location: [facility];  Service: Orthopedics;  Laterality: Left;  . WISDOM TOOTH EXTRACTION     Social History   Occupational History  . Occupation: business development  Tobacco Use  . Smoking status: Former Smoker    Quit date: 05/10/1964    Years since quitting: 55.3  . Smokeless tobacco: Never Used  Vaping Use  . Vaping Use: Never used  Substance and Sexual Activity  . Alcohol use: Yes    Alcohol/week: 21.0 standard drinks    Types: 21 Standard drinks or equivalent per week    Comment: wine with dinner  . Drug use: No  . Sexual activity: Not on file

## 2019-09-27 ENCOUNTER — Ambulatory Visit: Payer: Medicare HMO | Admitting: Rehabilitative and Restorative Service Providers"

## 2019-09-28 ENCOUNTER — Ambulatory Visit (INDEPENDENT_AMBULATORY_CARE_PROVIDER_SITE_OTHER): Payer: Medicare HMO | Admitting: Surgical

## 2019-09-28 ENCOUNTER — Other Ambulatory Visit: Payer: Self-pay

## 2019-09-28 ENCOUNTER — Encounter: Payer: Self-pay | Admitting: Surgical

## 2019-09-28 ENCOUNTER — Encounter: Payer: Self-pay | Admitting: Rehabilitative and Restorative Service Providers"

## 2019-09-28 ENCOUNTER — Ambulatory Visit (INDEPENDENT_AMBULATORY_CARE_PROVIDER_SITE_OTHER): Payer: Medicare HMO | Admitting: Rehabilitative and Restorative Service Providers"

## 2019-09-28 VITALS — Ht 70.0 in | Wt 194.0 lb

## 2019-09-28 DIAGNOSIS — R262 Difficulty in walking, not elsewhere classified: Secondary | ICD-10-CM

## 2019-09-28 DIAGNOSIS — M6281 Muscle weakness (generalized): Secondary | ICD-10-CM

## 2019-09-28 DIAGNOSIS — M25662 Stiffness of left knee, not elsewhere classified: Secondary | ICD-10-CM

## 2019-09-28 DIAGNOSIS — M25562 Pain in left knee: Secondary | ICD-10-CM

## 2019-09-28 DIAGNOSIS — R600 Localized edema: Secondary | ICD-10-CM

## 2019-09-28 DIAGNOSIS — S76112A Strain of left quadriceps muscle, fascia and tendon, initial encounter: Secondary | ICD-10-CM

## 2019-09-28 NOTE — Therapy (Signed)
[facility] [address] [place], [place], [address] Phone: [phone]   Fax:  [phone]  Physical Therapy Evaluation  Patient Details  Name: Matthew Simon MRN: [id number] Date of Birth: 13-Nov-1950 Referring Provider (PT): Dr. Marlou Sa   Encounter Date: 09/28/2019   PT End of Session - 09/28/19 1142    Visit Number 1    Number of Visits 12    Date for PT Re-Evaluation 11/23/19    Progress Note Due on Visit 10    PT Start Time 6948    PT Stop Time 1225    PT Time Calculation (min) 40 min    Activity Tolerance Patient tolerated treatment well    Behavior During Therapy [facility] for tasks assessed/performed           Past Medical History:  Diagnosis Date  . Gout   . Hypertension     Past Surgical History:  Procedure Laterality Date  . COLONOSCOPY    . EXCISION MASS NECK N/A 05/24/2017   Procedure: EXCISION POSTERIOR NECK MASS;  Surgeon: Armandina Gemma, MD;  Location: [place];  Service: General;  Laterality: N/A;  . QUADRICEPS TENDON REPAIR Left 08/10/2019   Procedure: REPAIR QUADRICEP TENDON RUPTURE;  Surgeon: Meredith Pel, MD;  Location: [place];  Service: Orthopedics;  Laterality: Left;  . WISDOM TOOTH EXTRACTION      There were no vitals filed for this visit.    Subjective Assessment - 09/28/19 1149    Subjective Pt. comes to clinic s/p quad tendon repair performed on 08/10/2019.  Saw PA today and brace from 0-90.   Pt. stated he fell down front steps back in July 7th range.    Limitations Standing;Walking    Patient Stated Goals Reduce pain, get back to golf/tennis    Currently in Pain? Yes    Pain Score 0-No pain   at worst 3/10   Pain Location Knee    Pain Orientation Left    Pain Descriptors / Indicators Aching;Sharp    Pain Type Surgical pain    Pain Frequency Occasional    Aggravating Factors  Bending, transfers, stairs,squats, walking ( limited due to brace protocol)    Pain Relieving Factors avoid quick movement    Effect  of Pain on Daily Activities Limiting in standing/walking, bending, golf/tennis              [facility] PT Assessment - 09/28/19 0001      Assessment   Medical Diagnosis Lt quad tendon rupture c repair    Referring Provider (PT) Dr. Marlou Sa    Onset Date/Surgical Date 08/10/19      Precautions   Precaution Comments Locking brace, 0-90 per MD note      Balance Screen   Has the patient fallen in the past 6 months Yes    How many times? 1    Is the patient reluctant to leave their home because of a fear of falling?  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)    Leisure golf, walking, tennis      Cognition   Overall Cognitive Status Within Functional Limits for tasks  assessed      Observation/Other Assessments   Observations Localized edema Lt knee, pitting edema noted in distal LE/ankle Lt      Sensation   Light Touch Appears Intact      Functional Tests   Functional tests Single leg stance      Single Leg Stance   Comments unable to perform on Lt LE      ROM / Strength   AROM / PROM / Strength Strength;PROM;AROM      AROM   AROM Assessment Site Knee    Right/Left Knee Left;Right    Right Knee Extension 0    Right Knee Flexion 125    Left Knee Extension -10    Left Knee Flexion 90      PROM   Overall PROM Comments Lt knee -5 to 90 (brace allowed)    PROM Assessment Site Knee      Strength   Overall Strength Comments Isometric mid range resistive test Lt knee ext/flexion fair to good    Strength Assessment Site Ankle;Knee;Hip    Right/Left Hip Left;Right    Right Hip Flexion 5/5    Left Hip Flexion 5/5    Right/Left Knee Left;Right    Right Knee Flexion 5/5      Right Knee Extension 5/5    Right/Left Ankle Left;Right    Right Ankle Dorsiflexion 5/5    Left Ankle Dorsiflexion 5/5      Ambulation/Gait   Gait Comments Independent c bledsoe brace c limited in swing phase clear                      Objective measurements completed on examination: See above findings.       [facility] Adult PT Treatment/Exercise - 09/28/19 0001      Exercises   Exercises Knee/Hip;Other Exercises    Other Exercises  BFR LOP Lt LE 258 mmhHG cuff size 4      Knee/Hip Exercises: Seated   Other Seated Knee/Hip Exercises BFR 80% LAQ Lt, SLR x 10 each      Knee/Hip Exercises: Supine   Quad Sets Left   5 sec hold x 15 c BFR 80% LOP   Heel Slides Left;10 reps    Straight Leg Raises Left;10 reps   c BFR 80% LOP   Other Supine Knee/Hip Exercises cellular swelling BFR 80% LOP 5 mins                  PT Education - 09/28/19 1142    Education Details HEP, POC, DN    Person(s) Educated Patient    Methods Explanation;Demonstration;Verbal cues    Comprehension Verbalized understanding;Returned demonstration            PT Short Term Goals - 09/28/19 1244      PT SHORT TERM GOAL #1   Title Patient will demonstrate independent use of home exercise program to maintain progress from in clinic treatments.    Time 2    Period Weeks    Status New    Target Date 10/12/19             PT Long Term Goals - 09/28/19 1238      PT LONG TERM GOAL #1   Title Patient will demonstrate/report pain at worst less than or equal to 2/10 to facilitate minimal limitation in daily activity secondary to pain symptoms.    Time 8    Period Weeks    Status New  Target Date 11/23/19      PT LONG TERM GOAL #2   Title Patient will demonstrate independent use of home exercise program to facilitate ability to maintain/progress functional gains from skilled physical therapy services.    Time 8    Period Weeks    Status New    Target Date 11/23/19      PT LONG  TERM GOAL #3   Title Patient will demonstrate independent ambulation community distances > 300 ft to facilitate community integration at [facility].    Time 8    Period Weeks    Status New    Target Date 11/23/19      PT LONG TERM GOAL #4   Title Pt. will demonstrate Lt knee arom 0-120 degrees to facilitate ability to perform usual transfers, standing, walking at PLOF s limitation.    Time 8    Period Weeks    Status New    Target Date 11/23/19      PT LONG TERM GOAL #5   Title Pt. will demonstrate/report Lt LE MMT 5/5 throughout to facilitate ablity to perform stairs, squatting, walking at PLOF s limitation.    Time 8    Period Weeks    Status New    Target Date 11/23/19                  Plan - 09/28/19 1143    Clinical Impression Statement Patient is a 69 y.o. male who comes to clinic with complaints of Lt knee pain, s/p Lt quad tendon repair with mobility, strength and movement coordination deficits that impair his ability to perform usual daily and recreational functional activities without increase difficulty/symptoms at this time.  Patient to benefit from skilled PT services to address impairments and limitations to improve to previous level of function without restriction secondary to condition.    Examination-Activity Limitations Sit;Sleep;Squat;Stairs;Stand;Carry;Bend;Bed Mobility;Transfers;Dressing;Locomotion Level    Stability/Clinical Decision Making Stable/Uncomplicated    Clinical Decision Making Low    Rehab Potential Good    PT Frequency --   1-2x/week   PT Duration 8 weeks    PT Treatment/Interventions ADLs/Self Care Home Management;Cryotherapy;Functional mobility training;Electrical Stimulation;Iontophoresis 4mg /ml Dexamethasone;Moist Heat;Balance training;Therapeutic exercise;Therapeutic activities;Stair training;Gait training;Ultrasound;Neuromuscular re-education;Patient/family education;Manual techniques;Vasopneumatic Device;Taping;Dry needling;Passive range of  motion;Spinal Manipulations;Joint Manipulations    PT Home Exercise Plan [email or web address]    Consulted and Agree with Plan of Care Patient           Patient will benefit from skilled therapeutic intervention in order to improve the following deficits and impairments:  Abnormal gait, Decreased endurance, Hypomobility, Increased edema, Decreased activity tolerance, Decreased strength, Increased fascial restricitons, Pain, Increased muscle spasms, Difficulty walking, Decreased mobility, Decreased balance, Decreased range of motion, Impaired flexibility, Decreased coordination  Visit Diagnosis: Left knee pain, unspecified chronicity  Muscle weakness (generalized)  Stiffness of left knee, not elsewhere classified  Difficulty in walking, not elsewhere classified  Localized edema     Problem List Patient Active Problem List   Diagnosis Date Noted  . Rupture of left quadriceps tendon 08/09/2019  . Soft tissue mass 05/22/2017  . Gout 05/05/2016  . Hypertension 04/10/2011  . Colon polyp 04/10/2011    Scot Jun, PT, DPT, OCS, ATC 09/28/19  12:47 PM    [facility] [address] [place], [place], [address] Phone: [phone]   Fax:  [phone]  Name: Matthew Simon MRN: [id number] Date of Birth: [birth year]

## 2019-09-28 NOTE — Patient Instructions (Signed)
Access Code: [email or web address] URL: [email or web address] Date: 09/28/2019 Prepared by: Scot Jun  Exercises Supine Quad Set - 2 x daily - 7 x weekly - 1 sets - 15-20 reps - 5 hold Small Range Straight Leg Raise - 2 x daily - 7 x weekly - 10 reps - 3 sets Supine Heel Slide - 2 x daily - 7 x weekly - 10 reps - 3 sets - 2 hold Seated Long Arc Quad - 2 x daily - 7 x weekly - 10 reps - 3 sets - 2 hold Seated Straight Leg Heel Taps - 2 x daily - 7 x weekly - 3 sets - 10 reps Sidelying Hip Abduction - 2 x daily - 7 x weekly - 3 sets - 10 reps

## 2019-09-29 ENCOUNTER — Other Ambulatory Visit: Payer: Self-pay | Admitting: Family Medicine

## 2019-09-29 DIAGNOSIS — I10 Essential (primary) hypertension: Secondary | ICD-10-CM

## 2019-09-29 NOTE — Telephone Encounter (Signed)
Requested medication (s) are due for refill today: yes  Requested medication (s) are on the active medication list: yes  Last refill:  09/04/2019  Future visit scheduled: no  Notes to clinic:  overdue for follow up    Requested Prescriptions  Pending Prescriptions Disp Refills   amLODipine (NORVASC) 5 MG tablet [Pharmacy Med Name: AMLODIPINE BESYLATE 5 MG TAB] 30 tablet 0    Sig: TAKE 1 TABLET BY MOUTH EVERY DAY      Cardiovascular:  Calcium Channel Blockers Failed - 09/29/2019 12:33 PM      Failed - Last BP in normal range    BP Readings from Last 1 Encounters:  08/12/19 (!) 155/82          Failed - Valid encounter within last 6 months    Recent Outpatient Visits           8 months ago Urinary hesitancy   [facility], Ranell Patrick, MD   10 months ago Urinary hesitancy   [facility], Ranell Patrick, MD   11 months ago COVID-19   [facility], Ranell Patrick, MD   12 months ago Cough   [facility], Ranell Patrick, MD   1 year ago Cough   [facility], Ranell Patrick, MD       Future Appointments             In 3 weeks Marlou Sa, Tonna Corner, MD [place]              lisinopril (ZESTRIL) 20 MG tablet [Pharmacy Med Name: LISINOPRIL 20 MG TABLET] 30 tablet 0    Sig: TAKE 1 TABLET BY MOUTH EVERY DAY      Cardiovascular:  ACE Inhibitors Failed - 09/29/2019 12:33 PM      Failed - Cr in normal range and within 180 days    Creat  Date Value Ref Range Status  02/21/2015 1.28 (H) 0.70 - 1.25 mg/dL Final   Creatinine, Ser  Date Value Ref Range Status  08/10/2019 1.25 (H) 0.61 - 1.24 mg/dL Final          Failed - Last BP in normal range    BP Readings from Last 1 Encounters:  08/12/19 (!) 155/82          Failed - Valid encounter within last 6 months    Recent Outpatient Visits           8 months ago Urinary hesitancy   [facility] [place], MD   10 months  ago Urinary hesitancy   [facility], Ranell Patrick, MD   11 months ago COVID-19   [facility], Ranell Patrick, MD   12 months ago Cough   [facility], Ranell Patrick, MD   1 year ago Cough   [facility], Ranell Patrick, MD       Future Appointments             In 3 weeks Marlou Sa, Tonna Corner, MD [place] in normal range and within 180 days    Potassium  Date Value Ref Range Status  08/10/2019 3.7 3.5 - 5.1 mmol/L Final          Passed - Patient is not pregnant

## 2019-09-29 NOTE — Telephone Encounter (Signed)
09/29/2019 - PATIENT IS REQUESTING REFILLS ON HIS AMLODIPINE 5 mg AND LISINOPRIL 20 mg. I TRIED TO CALL AND SCHEDULE A OFFICE VISIT WITH DR. Carlota Raspberry, BUT I HAD TO LEAVE A MESSAGE ON HIS VOICE MAIL TO RETURN MY CALL. AFTER THIS APPOINTMENT IS MADE MEI WILL GIVE HIM COURTESY REFILLS UNTIL HE CAN COME IN. I WILL NOT ROUTE BACK TO THE CLINICAL STATION AT THIS TIME. [place]

## 2019-09-29 NOTE — Telephone Encounter (Signed)
Please schedule appointment then we can send in refill until he is scheduled to be seen.

## 2019-10-01 ENCOUNTER — Encounter: Payer: Self-pay | Admitting: Surgical

## 2019-10-01 NOTE — Progress Notes (Signed)
   Post-Op Visit Note   Patient: Matthew Simon           Date of Birth: Oct 02, 1950           MRN: [id number] Visit Date: 09/28/2019 PCP: Wendie Agreste, MD   Assessment & Plan:  Chief Complaint:  Chief Complaint  Patient presents with  . Left Leg - Follow-up    Quad tendon rupture   Visit Diagnoses:  1. Rupture of left quadriceps tendon, initial encounter     Plan: Patient is a 69 year old male who presents s/p left quadriceps tendon rupture repair.  He is about 6 weeks out from procedure.  He notes that he is improving slowly but steadily.  He has not started physical therapy yet.  He is still in Bledsoe brace that is set from 0-60.  He is taking methocarbamol but no narcotic pain medication.  On exam his incision is healed very well.  He has no calf tenderness and a negative Homans' sign.  No extensor lag with active extension of the left knee.  He is able to easily perform multiple straight leg raises without fatigue or any extensor lag.  He has 0 degrees of extension and 80 degrees of flexion passively.  Plan to increase range of motion of the Bledsoe brace from 0-60 to 0-90.  After he is at 90 degrees for 1 week he may wean out of the Bledsoe brace over the next week.  His first day of physical therapy is today.  Continue with PT and follow-up in 4 weeks for clinical recheck.  Patient agreed with plan.  Follow-Up Instructions: Return in about 4 weeks (around 10/26/2019).   Orders:  No orders of the defined types were placed in this encounter.  No orders of the defined types were placed in this encounter.   Imaging: No results found.  PMFS History: Patient Active Problem List   Diagnosis Date Noted  . Rupture of left quadriceps tendon 08/09/2019  . Soft tissue mass 05/22/2017  . Gout 05/05/2016  . Hypertension 04/10/2011  . Colon polyp 04/10/2011   Past Medical History:  Diagnosis Date  . Gout   . Hypertension     Family History  Problem Relation Age of Onset    . Cancer Mother        lymphoscarcoma  . Stroke Father   . Colon cancer Neg Hx   . Rectal cancer Neg Hx     Past Surgical History:  Procedure Laterality Date  . COLONOSCOPY    . EXCISION MASS NECK N/A 05/24/2017   Procedure: EXCISION POSTERIOR NECK MASS;  Surgeon: Armandina Gemma, MD;  Location: [place];  Service: General;  Laterality: N/A;  . QUADRICEPS TENDON REPAIR Left 08/10/2019   Procedure: REPAIR QUADRICEP TENDON RUPTURE;  Surgeon: Meredith Pel, MD;  Location: [place];  Service: Orthopedics;  Laterality: Left;  . WISDOM TOOTH EXTRACTION     Social History   Occupational History  . Occupation: business development  Tobacco Use  . Smoking status: Former Smoker    Quit date: 05/10/1964    Years since quitting: 55.4  . Smokeless tobacco: Never Used  Vaping Use  . Vaping Use: Never used  Substance and Sexual Activity  . Alcohol use: Yes    Alcohol/week: 21.0 standard drinks    Types: 21 Standard drinks or equivalent per week    Comment: wine with dinner  . Drug use: No  . Sexual activity: Not on file

## 2019-10-05 ENCOUNTER — Telehealth: Payer: Self-pay

## 2019-10-05 DIAGNOSIS — K8689 Other specified diseases of pancreas: Secondary | ICD-10-CM

## 2019-10-05 DIAGNOSIS — R935 Abnormal findings on diagnostic imaging of other abdominal regions, including retroperitoneum: Secondary | ICD-10-CM

## 2019-10-05 DIAGNOSIS — D7389 Other diseases of spleen: Secondary | ICD-10-CM

## 2019-10-05 NOTE — Telephone Encounter (Signed)
-----   Message from Roetta Sessions, [place] sent at 05/17/2019  9:28 AM EDT ----- Regarding: MRCP due in Sept Due for MRCP Abdomen  W WO around  10-26-2019  abnormal imaging

## 2019-10-05 NOTE — Telephone Encounter (Signed)
Order is in. Call to schedule MR MRCP at [facility] in mid to late Sept. Phone system down

## 2019-10-06 ENCOUNTER — Telehealth: Payer: Self-pay

## 2019-10-06 ENCOUNTER — Telehealth: Payer: Self-pay | Admitting: Orthopedic Surgery

## 2019-10-06 ENCOUNTER — Encounter: Payer: Self-pay | Admitting: Orthopedic Surgery

## 2019-10-06 ENCOUNTER — Ambulatory Visit (INDEPENDENT_AMBULATORY_CARE_PROVIDER_SITE_OTHER): Payer: Medicare HMO | Admitting: Orthopedic Surgery

## 2019-10-06 VITALS — Ht 70.0 in | Wt 194.0 lb

## 2019-10-06 DIAGNOSIS — S76112A Strain of left quadriceps muscle, fascia and tendon, initial encounter: Secondary | ICD-10-CM

## 2019-10-06 NOTE — Telephone Encounter (Signed)
Pt needs to go to the lab for BUN and Creatinine prior to MR MRCP on 9-20.  scheduled for an MRI at Monday, 10-23-19 at [facility], Radiology, 1st floor. appointment time is 9:00am. arrive 15 minutes prior, NPO 6 hours

## 2019-10-06 NOTE — Telephone Encounter (Signed)
I called and he came in

## 2019-10-06 NOTE — Progress Notes (Signed)
   Post-Op Visit Note   Patient: Matthew Simon           Date of Birth: 08-22-1950           MRN: [id number] Visit Date: 10/06/2019 PCP: Matthew Agreste, MD   Assessment & Plan:  Chief Complaint:  Chief Complaint  Patient presents with  . Left Knee - Follow-up    08/10/2019 Left Quad Tendon repair   Visit Diagnoses:  1. Rupture of left quadriceps tendon, initial encounter     Plan: [name] is a 70-year-old patient who is now about 6 weeks out left knee quad tendon repair.  He has been doing well but has had some swelling associated with brace use.  On exam he does have some swelling both above and below what appears to be the third strap of the Bledsoe brace.  I think this really represents window edema.  Negative calf tenderness and negative Homans today.  I will have him discontinue the Bledsoe brace and elevate the leg.  I think this should be a short-lived problem.  Keep his follow-up appointment and we will see how the edema is resolving.  He has full extension and flexion past 90.  Repair has no palpable gaps in looks strong.  I do not really want him changing his activity that he is doing now but I do want him to come out of the brace.  Follow-Up Instructions: No follow-ups on file.   Orders:  No orders of the defined types were placed in this encounter.  No orders of the defined types were placed in this encounter.   Imaging: No results found.  PMFS History: Patient Active Problem List   Diagnosis Date Noted  . Rupture of left quadriceps tendon 08/09/2019  . Soft tissue mass 05/22/2017  . Gout 05/05/2016  . Hypertension 04/10/2011  . Colon polyp 04/10/2011   Past Medical History:  Diagnosis Date  . Gout   . Hypertension     Family History  Problem Relation Age of Onset  . Cancer Mother        lymphoscarcoma  . Stroke Father   . Colon cancer Neg Hx   . Rectal cancer Neg Hx     Past Surgical History:  Procedure Laterality Date  . COLONOSCOPY    . EXCISION  MASS NECK N/A 05/24/2017   Procedure: EXCISION POSTERIOR NECK MASS;  Surgeon: Armandina Gemma, MD;  Location: [place];  Service: General;  Laterality: N/A;  . QUADRICEPS TENDON REPAIR Left 08/10/2019   Procedure: REPAIR QUADRICEP TENDON RUPTURE;  Surgeon: Meredith Pel, MD;  Location: [place];  Service: Orthopedics;  Laterality: Left;  . WISDOM TOOTH EXTRACTION     Social History   Occupational History  . Occupation: business development  Tobacco Use  . Smoking status: Former Smoker    Quit date: 05/10/1964    Years since quitting: 55.4  . Smokeless tobacco: Never Used  Vaping Use  . Vaping Use: Never used  Substance and Sexual Activity  . Alcohol use: Yes    Alcohol/week: 21.0 standard drinks    Types: 21 Standard drinks or equivalent per week    Comment: wine with dinner  . Drug use: No  . Sexual activity: Not on file

## 2019-10-06 NOTE — Telephone Encounter (Signed)
Pt would like a CB from Dr.Dean in regards to his swollen knee and ankle.  [phone]

## 2019-10-06 NOTE — Telephone Encounter (Signed)
Called and Left detailed message for pt with instructions for MR MRCP and for lab work in the next week or 10 days.  Will send MyChart message and letter to pt

## 2019-10-06 NOTE — Telephone Encounter (Signed)
Patient called stating that the swelling has increased in his left knee and left ankle.  Would like to know what could be causing the swelling?  Stated that he is wearing a brace and it is at 90 degrees.  Cb# [phone].  Please advise.  Thank you.

## 2019-10-06 NOTE — Telephone Encounter (Signed)
noted 

## 2019-10-06 NOTE — Telephone Encounter (Signed)
Please advise 

## 2019-10-06 NOTE — Telephone Encounter (Signed)
Duplicate message in chart sent to Dr. Marlou Sa.

## 2019-10-16 ENCOUNTER — Other Ambulatory Visit (INDEPENDENT_AMBULATORY_CARE_PROVIDER_SITE_OTHER): Payer: Medicare HMO

## 2019-10-16 DIAGNOSIS — D7389 Other diseases of spleen: Secondary | ICD-10-CM | POA: Diagnosis not present

## 2019-10-16 DIAGNOSIS — R935 Abnormal findings on diagnostic imaging of other abdominal regions, including retroperitoneum: Secondary | ICD-10-CM

## 2019-10-16 DIAGNOSIS — K8689 Other specified diseases of pancreas: Secondary | ICD-10-CM | POA: Diagnosis not present

## 2019-10-16 LAB — CREATININE, SERUM: Creatinine, Ser: 1.3 mg/dL (ref 0.40–1.50)

## 2019-10-16 LAB — BUN: BUN: 16 mg/dL (ref 6–23)

## 2019-10-21 ENCOUNTER — Other Ambulatory Visit: Payer: Self-pay | Admitting: Family Medicine

## 2019-10-21 DIAGNOSIS — R300 Dysuria: Secondary | ICD-10-CM

## 2019-10-21 DIAGNOSIS — I10 Essential (primary) hypertension: Secondary | ICD-10-CM

## 2019-10-21 NOTE — Telephone Encounter (Signed)
Requested  medications are  due for refill today yes  Requested medications are on the active medication list yes  Last refill 6/24  Last visit 01/2019  Future visit scheduled no  Notes to clinic Failed protocol of visit within 6 months, no scheduled visit.

## 2019-10-23 ENCOUNTER — Ambulatory Visit (HOSPITAL_COMMUNITY)
Admission: RE | Admit: 2019-10-23 | Discharge: 2019-10-23 | Disposition: A | Discharge status: Home / Self Care | Payer: Medicare HMO | Source: Ambulatory Visit | Attending: Gastroenterology | Admitting: Gastroenterology

## 2019-10-23 ENCOUNTER — Other Ambulatory Visit: Payer: Self-pay

## 2019-10-23 ENCOUNTER — Other Ambulatory Visit: Payer: Self-pay | Admitting: Gastroenterology

## 2019-10-23 DIAGNOSIS — D7389 Other diseases of spleen: Secondary | ICD-10-CM

## 2019-10-23 DIAGNOSIS — K8689 Other specified diseases of pancreas: Secondary | ICD-10-CM | POA: Insufficient documentation

## 2019-10-23 DIAGNOSIS — R935 Abnormal findings on diagnostic imaging of other abdominal regions, including retroperitoneum: Secondary | ICD-10-CM

## 2019-10-23 MED ORDER — GADOBUTROL 1 MMOL/ML IV SOLN
8.0000 mL | Freq: Once | INTRAVENOUS | Status: AC | PRN
  Administered 2019-10-23: 8 mL via INTRAVENOUS

## 2019-10-24 ENCOUNTER — Telehealth: Payer: Self-pay

## 2019-10-24 ENCOUNTER — Telehealth: Payer: Self-pay | Admitting: Family Medicine

## 2019-10-24 NOTE — Telephone Encounter (Signed)
10/24/2019 - PATIENT IS REQUESTING A REFILL ON HIS DOXAZOSIN MESYLATE 2 MG. I TRIED TO CALL AND SCHEDULE AN OFFICE VISIT WITH DR. Carlota Raspberry BUT I HAD TO LEAVE A VOICE MAIL TO RETURN MY CALL. I WILL NOT ROUTE MESSAGE BACK TO THE CLINICAL TEAM BECAUSE HE HAS ALREADY BEEN GIVEN A COURTESY REFILL AND NEEDS TO MAKE AN APPOINTMENT. [place]

## 2019-10-24 NOTE — Telephone Encounter (Signed)
Pt needs to have an appointment for additional refills and for a follow up with the provider.   We are not able to refill medications without office visit scheduled due to [place] refill has already been filled.

## 2019-10-24 NOTE — Telephone Encounter (Signed)
Pt is calling pt is needing a courtesy refill for pts Rx listed below until his med refill appt with his provider on 11/20/19. Please advise.   lisinopril (ZESTRIL) 20 MG tablet [[id number]]   doxazosin (CARDURA) 2 MG tablet [[id number]]   allopurinol (ZYLOPRIM) 100 MG tablet [[id number]]   amLODipine (NORVASC) 5 MG tablet [[id number]]

## 2019-10-24 NOTE — Telephone Encounter (Signed)
Patient called to discuss MRI results. Read result note from Dr. Havery Moros. Pt states that he is unable to access My Chart, requested that I mail the results, confirmed mailing address. Will mail letter today.

## 2019-10-25 ENCOUNTER — Other Ambulatory Visit: Payer: Self-pay

## 2019-10-25 DIAGNOSIS — R300 Dysuria: Secondary | ICD-10-CM

## 2019-10-25 DIAGNOSIS — M109 Gout, unspecified: Secondary | ICD-10-CM

## 2019-10-25 DIAGNOSIS — I10 Essential (primary) hypertension: Secondary | ICD-10-CM

## 2019-10-25 MED ORDER — ALLOPURINOL 100 MG PO TABS: 100.0000 mg | ORAL_TABLET | Freq: Every day | ORAL | 0 refills | Status: DC

## 2019-10-25 MED ORDER — LISINOPRIL 20 MG PO TABS: 20.0000 mg | ORAL_TABLET | Freq: Every day | ORAL | 0 refills | Status: DC

## 2019-10-25 MED ORDER — AMLODIPINE BESYLATE 5 MG PO TABS: 5.0000 mg | ORAL_TABLET | Freq: Every day | ORAL | 0 refills | Status: DC

## 2019-10-25 MED ORDER — DOXAZOSIN MESYLATE 2 MG PO TABS: 2.0000 mg | ORAL_TABLET | Freq: Every day | ORAL | 0 refills | Status: DC

## 2019-10-26 ENCOUNTER — Ambulatory Visit (INDEPENDENT_AMBULATORY_CARE_PROVIDER_SITE_OTHER): Payer: Medicare HMO | Admitting: Orthopedic Surgery

## 2019-10-26 ENCOUNTER — Encounter: Payer: Self-pay | Admitting: Orthopedic Surgery

## 2019-10-26 DIAGNOSIS — S76112A Strain of left quadriceps muscle, fascia and tendon, initial encounter: Secondary | ICD-10-CM

## 2019-10-26 NOTE — Progress Notes (Signed)
   Post-Op Visit Note   Patient: Matthew Simon           Date of Birth: [birth year]           MRN: [id number] Visit Date: 10/26/2019 PCP: Wendie Agreste, MD   Assessment & Plan:  Chief Complaint:  Chief Complaint  Patient presents with  . Left Leg - Routine Post Op   Visit Diagnoses:  1. Rupture of left quadriceps tendon, initial encounter     Plan: Matthew Simon is a 69 year old patient who is now about 2-1/2 months out left quad tendon repair.  Doing well.  On exam has about a 5 degree extensor lag.  Trace effusion.  Has excellent range of motion and strength.  Some atrophy to be expected in that left leg.  Diminished swelling in the calf with negative Homans.  Plan at this time is okay for stationary bike.  6-week return and we will likely be able to get him back to tennis and golf at that time.  Follow-Up Instructions: Return in about 6 weeks (around 12/07/2019).   Orders:  No orders of the defined types were placed in this encounter.  No orders of the defined types were placed in this encounter.   Imaging: No results found.  PMFS History: Patient Active Problem List   Diagnosis Date Noted  . Rupture of left quadriceps tendon 08/09/2019  . Soft tissue mass 05/22/2017  . Gout 05/05/2016  . Hypertension 04/10/2011  . Colon polyp 04/10/2011   Past Medical History:  Diagnosis Date  . Gout   . Hypertension     Family History  Problem Relation Age of Onset  . Cancer Mother        lymphoscarcoma  . Stroke Father   . Colon cancer Neg Hx   . Rectal cancer Neg Hx     Past Surgical History:  Procedure Laterality Date  . COLONOSCOPY    . EXCISION MASS NECK N/A 05/24/2017   Procedure: EXCISION POSTERIOR NECK MASS;  Surgeon: Armandina Gemma, MD;  Location: [place];  Service: General;  Laterality: N/A;  . QUADRICEPS TENDON REPAIR Left 08/10/2019   Procedure: REPAIR QUADRICEP TENDON RUPTURE;  Surgeon: Meredith Pel, MD;  Location: [place];  Service: Orthopedics;  Laterality:  Left;  . WISDOM TOOTH EXTRACTION     Social History   Occupational History  . Occupation: business development  Tobacco Use  . Smoking status: Former Smoker    Quit date: 05/10/1964    Years since quitting: 55.4  . Smokeless tobacco: Never Used  Vaping Use  . Vaping Use: Never used  Substance and Sexual Activity  . Alcohol use: Yes    Alcohol/week: 21.0 standard drinks    Types: 21 Standard drinks or equivalent per week    Comment: wine with dinner  . Drug use: No  . Sexual activity: Not on file

## 2019-11-16 ENCOUNTER — Telehealth: Payer: Self-pay | Admitting: Family Medicine

## 2019-11-16 NOTE — Telephone Encounter (Signed)
Pt called to say he missed a phone call from our office / was unable to find documentation

## 2019-11-17 ENCOUNTER — Other Ambulatory Visit: Payer: Self-pay | Admitting: Family Medicine

## 2019-11-17 DIAGNOSIS — I10 Essential (primary) hypertension: Secondary | ICD-10-CM

## 2019-11-17 NOTE — Telephone Encounter (Signed)
Requested Prescriptions  Pending Prescriptions Disp Refills   amLODipine (NORVASC) 5 MG tablet [Pharmacy Med Name: AMLODIPINE BESYLATE 5 MG TAB] 30 tablet 0    Sig: TAKE 1 TABLET BY MOUTH EVERY DAY     Cardiovascular:  Calcium Channel Blockers Failed - 11/17/2019  2:32 PM      Failed - Last BP in normal range    BP Readings from Last 1 Encounters:  08/12/19 (!) 155/82         Failed - Valid encounter within last 6 months    Recent Outpatient Visits          10 months ago Urinary hesitancy   [facility], Ranell Patrick, MD   11 months ago Urinary hesitancy   [facility], Ranell Patrick, MD   1 year ago COVID-19   [facility], Ranell Patrick, MD   1 year ago Cough   [facility], Ranell Patrick, MD   1 year ago Cough   [facility], Ranell Patrick, MD      Future Appointments            In 3 days Wendie Agreste, MD [facility] [place], [facility]   In 2 weeks Marlou Sa, Tonna Corner, MD [place]            lisinopril (ZESTRIL) 20 MG tablet [Pharmacy Med Name: LISINOPRIL 20 MG TABLET] 30 tablet 0    Sig: TAKE 1 TABLET BY MOUTH EVERY DAY     Cardiovascular:  ACE Inhibitors Failed - 11/17/2019  2:32 PM      Failed - Last BP in normal range    BP Readings from Last 1 Encounters:  08/12/19 (!) 155/82         Failed - Valid encounter within last 6 months    Recent Outpatient Visits          10 months ago Urinary hesitancy   [facility] [place], MD   11 months ago Urinary hesitancy   [facility] [place] [facility], Ranell Patrick, MD   1 year ago COVID-71   [facility], Ranell Patrick, MD   1 year ago Cough   [facility] [place] [facility], Ranell Patrick, MD   1 year ago Cough   [facility] [place] [facility], Ranell Patrick, MD      Future Appointments            In 3 days Wendie Agreste, MD [facility] [place], [facility]   In 2 weeks Marlou Sa, Tonna Corner, MD [place] in normal range and within 180 days    Creat  Date Value Ref Range Status  02/21/2015 1.28 (H) 0.70 - 1.25 mg/dL Final   Creatinine, Ser  Date Value Ref Range Status  10/16/2019 1.30 0.40 - 1.50 mg/dL Final         Passed - K in normal range and within 180 days    Potassium  Date Value Ref Range Status  08/10/2019 3.7 3.5 - 5.1 mmol/L Final         Passed - Patient is not pregnant

## 2019-11-20 ENCOUNTER — Encounter: Payer: Self-pay | Admitting: Family Medicine

## 2019-11-20 ENCOUNTER — Other Ambulatory Visit: Payer: Self-pay

## 2019-11-20 ENCOUNTER — Ambulatory Visit (INDEPENDENT_AMBULATORY_CARE_PROVIDER_SITE_OTHER): Payer: Medicare HMO | Admitting: Family Medicine

## 2019-11-20 VITALS — BP 117/67 | HR 85 | Temp 98.3°F | Ht 70.0 in | Wt 187.0 lb

## 2019-11-20 DIAGNOSIS — M109 Gout, unspecified: Secondary | ICD-10-CM | POA: Diagnosis not present

## 2019-11-20 DIAGNOSIS — E785 Hyperlipidemia, unspecified: Secondary | ICD-10-CM

## 2019-11-20 DIAGNOSIS — I10 Essential (primary) hypertension: Secondary | ICD-10-CM | POA: Diagnosis not present

## 2019-11-20 DIAGNOSIS — R739 Hyperglycemia, unspecified: Secondary | ICD-10-CM | POA: Diagnosis not present

## 2019-11-20 DIAGNOSIS — M545 Low back pain, unspecified: Secondary | ICD-10-CM | POA: Diagnosis not present

## 2019-11-20 DIAGNOSIS — R300 Dysuria: Secondary | ICD-10-CM | POA: Diagnosis not present

## 2019-11-20 DIAGNOSIS — Z23 Encounter for immunization: Secondary | ICD-10-CM

## 2019-11-20 MED ORDER — AMLODIPINE BESYLATE 5 MG PO TABS: 5.0000 mg | ORAL_TABLET | Freq: Every day | ORAL | 2 refills | Status: DC

## 2019-11-20 MED ORDER — LISINOPRIL 20 MG PO TABS: 20.0000 mg | ORAL_TABLET | Freq: Every day | ORAL | 2 refills | Status: DC

## 2019-11-20 MED ORDER — ALLOPURINOL 100 MG PO TABS: 100.0000 mg | ORAL_TABLET | Freq: Every day | ORAL | 2 refills | Status: DC

## 2019-11-20 MED ORDER — DOXAZOSIN MESYLATE 2 MG PO TABS: 2.0000 mg | ORAL_TABLET | Freq: Every day | ORAL | 2 refills | Status: DC

## 2019-11-20 NOTE — Progress Notes (Signed)
Subjective:  Patient ID: Matthew Simon, male    DOB: 1950-08-07  Age: 69 y.o. MRN: [id number]  CC:  Chief Complaint  Patient presents with  . Medication Refill    on amlodipine,Allopurinol,Doxazosin, and Lisinopril.  pt reports these medication work well for hims with no known side effects.  . Back Pain    Pt reports starteing a couple weeks ago he has had lower back pain in the morning. pt reports he thinks he maybe sleeping wrong and causeing the pain that way.     HPI Matthew Simon presents for   Hypertension: Addition of Cardura October 2020.  Continued amlodipine 5mg , lisinopril 20mg  qd.  Home readings: similar readings at home 120/70 range.  No new side effects of meds.  On aspirin after leg surgery.   Mild elevation last year.  Lab Results  Component Value Date   CHOL 204 (H) 03/21/2018   HDL 82 03/21/2018   LDLCALC 97 03/21/2018   TRIG 125 03/21/2018   CHOLHDL 2.5 03/21/2018    Hyperglycemia. Glucose 139 on 08/10/19.  Not fasting - ate 2 hrs ago.  History of prediabetes, normal in 2018.  Lab Results  Component Value Date   HGBA1C 5.0 05/04/2016    BP Readings from Last 3 Encounters:  11/20/19 117/67  08/12/19 (!) 155/82  05/17/19 126/80   Lab Results  Component Value Date   CREATININE 1.30 10/16/2019    Gout: Last flare: none recent.  Daily meds: Allopurinol 100 mg daily. Prn med: none needed.  Lab Results  Component Value Date   LABURIC 5.1 03/21/2018   BPH Hesitancy discussed in October 2020.  Doxazosin started. Urinating better. Followed by Dr. Junious Silk at [facility] urology. Discussed in February.  Pancreas concern on imaging, seen by GI - Dr Havery Moros, MRCP - 9mm pancreatic duct (not 19mm as seen on CT), no concerning pathology of pancreas. Repeat MRCP in 6 months. 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 6 month MRI/MRCP planned.   Low back pain: R low back few weeks ago. No fall/injury but may have been  compensating with left leg injury. Stiff in am, improves during day.  .No bowel or bladder incontinence, no saddle anesthesia, no lower extremity weakness.  Stable over past few weeks.   History Patient Active Problem List   Diagnosis Date Noted  . Rupture of left quadriceps tendon 08/09/2019  . Soft tissue mass 05/22/2017  . Gout 05/05/2016  . Hypertension 04/10/2011  . Colon polyp 04/10/2011   Past Medical History:  Diagnosis Date  . Gout   . Hypertension    Past Surgical History:  Procedure Laterality Date  . COLONOSCOPY    . EXCISION MASS NECK N/A 05/24/2017   Procedure: EXCISION POSTERIOR NECK MASS;  Surgeon: Armandina Gemma, MD;  Location: [facility];  Service: General;  Laterality: N/A;  . QUADRICEPS TENDON REPAIR Left 08/10/2019   Procedure: REPAIR QUADRICEP TENDON RUPTURE;  Surgeon: Meredith Pel, MD;  Location: [facility];  Service: Orthopedics;  Laterality: Left;  . WISDOM TOOTH EXTRACTION     No Known Allergies Prior to Admission medications   Medication Sig Start Date End Date Taking? Authorizing Provider  allopurinol (ZYLOPRIM) 100 MG tablet Take 1 tablet (100 mg total) by mouth daily. 10/25/19  Yes Wendie Agreste, MD  amLODipine (NORVASC) 5 MG tablet TAKE 1 TABLET BY MOUTH EVERY DAY 11/17/19  Yes Wendie Agreste, MD  aspirin 81 MG chewable tablet Chew 1 tablet (81  mg total) by mouth in the morning and at bedtime. 08/12/19  Yes Dean, Tonna Corner, MD  doxazosin (CARDURA) 2 MG tablet Take 1 tablet (2 mg total) by mouth daily. 10/25/19  Yes Wendie Agreste, MD  lisinopril (ZESTRIL) 20 MG tablet TAKE 1 TABLET BY MOUTH EVERY DAY 11/17/19  Yes Wendie Agreste, MD  acetaminophen (TYLENOL) 325 MG tablet Take 1-2 tablets (325-650 mg total) by mouth every 6 (six) hours as needed for mild pain (pain score 1-3 or temp > 100.5). Patient not taking: Reported on 11/20/2019 08/31/19   Meredith Pel, MD  albuterol (VENTOLIN HFA) 108 (90 Base) MCG/ACT inhaler INHALE 1-2 PUFFS INTO  THE LUNGS EVERY 6 (SIX) HOURS AS NEEDED FOR WHEEZING OR SHORTNESS OF BREATH. Patient not taking: Reported on 11/20/2019 03/10/19   Wendie Agreste, MD  methocarbamol (ROBAXIN) 500 MG tablet Take 1 tablet (500 mg total) by mouth every 8 (eight) hours as needed for muscle spasms. Patient not taking: Reported on 11/20/2019 08/31/19   Meredith Pel, MD  oxyCODONE (OXY IR/ROXICODONE) 5 MG immediate release tablet Take 1 tablet (5 mg total) by mouth every 6 (six) hours as needed for moderate pain (pain score 4-6). Patient not taking: Reported on 11/20/2019 08/21/19   Magnant, Gerrianne Scale, PA-C  tamsulosin (FLOMAX) 0.4 MG CAPS capsule Take 0.4 mg by mouth at bedtime. Patient not taking: Reported on 11/20/2019 06/30/19   [provider]  vitamin B-12 (CYANOCOBALAMIN) 1000 MCG tablet Take 1,000 mcg by mouth daily. Patient not taking: Reported on 11/20/2019    [provider]   Social History   Socioeconomic History  . Marital status: Married    Spouse name: Not on file  . Number of children: Not on file  . Years of education: Not on file  . Highest education level: Not on file  Occupational History  . Occupation: business development  Tobacco Use  . Smoking status: Former Smoker    Quit date: 05/10/1964    Years since quitting: 55.5  . Smokeless tobacco: Never Used  Vaping Use  . Vaping Use: Never used  Substance and Sexual Activity  . Alcohol use: Yes    Alcohol/week: 21.0 standard drinks    Types: 21 Standard drinks or equivalent per week    Comment: wine with dinner  . Drug use: No  . Sexual activity: Not on file  Other Topics Concern  . Not on file  Social History Narrative  . Not on file   Social Determinants of Health   Financial Resource Strain:   . Difficulty of Paying Living Expenses: Not on file  Food Insecurity:   . Worried About Charity fundraiser in the Last Year: Not on file  . Ran Out of Food in the Last Year: Not on file  Transportation Needs:     . Lack of Transportation (Medical): Not on file  . Lack of Transportation (Non-Medical): Not on file  Physical Activity:   . Days of Exercise per Week: Not on file  . Minutes of Exercise per Session: Not on file  Stress:   . Feeling of Stress : Not on file  Social Connections:   . Frequency of Communication with Friends and Family: Not on file  . Frequency of Social Gatherings with Friends and Family: Not on file  . Attends Religious Services: Not on file  . Active Member of Clubs or Organizations: Not on file  . Attends Archivist Meetings: Not on file  .  Marital Status: Not on file  Intimate Partner Violence:   . Fear of Current or Ex-Partner: Not on file  . Emotionally Abused: Not on file  . Physically Abused: Not on file  . Sexually Abused: Not on file    Review of Systems  Constitutional: Negative for fatigue and unexpected weight change.  Eyes: Negative for visual disturbance.  Respiratory: Negative for cough, chest tightness and shortness of breath.   Cardiovascular: Negative for chest pain, palpitations and leg swelling.  Gastrointestinal: Negative for abdominal pain and blood in stool.  Neurological: Negative for dizziness, light-headedness and headaches.     Objective:   Vitals:   11/20/19 1446  BP: 117/67  Pulse: 85  Temp: 98.3 F (36.8 C)  TempSrc: Temporal  SpO2: 96%  Weight: 187 lb (84.8 kg)  Height: 5\' 10"  (1.778 m)     Physical Exam Vitals reviewed.  Constitutional:      Appearance: He is well-developed.  HENT:     Head: Normocephalic and atraumatic.  Eyes:     Pupils: Pupils are equal, round, and reactive to light.  Neck:     Vascular: No carotid bruit or JVD.  Cardiovascular:     Rate and Rhythm: Normal rate and regular rhythm.     Heart sounds: Normal heart sounds. No murmur heard.   Pulmonary:     Effort: Pulmonary effort is normal.     Breath sounds: Normal breath sounds. No rales.  Musculoskeletal:     Comments: Lumbar  spine: Full range of motion, pain at right paraspinals only with seated straight leg raise.  Able ambulate without difficulty, no midline bony tenderness.   Skin:    General: Skin is warm and dry.  Neurological:     General: No focal deficit present.     Mental Status: He is alert and oriented to person, place, and time.    34 minutes spent during visit, greater than 50% counseling and assimilation of information, chart review, and discussion of plan.    Assessment & Plan:  Matthew Simon is a 69 y.o. male . Essential hypertension - Plan: amLODipine (NORVASC) 5 MG tablet, doxazosin (CARDURA) 2 MG tablet, lisinopril (ZESTRIL) 20 MG tablet, Hemoglobin A1c, Comprehensive metabolic panel  -  Stable, tolerating current regimen. Medications refilled. Labs pending as above.   Gout, unspecified cause, unspecified chronicity, unspecified site - Plan: allopurinol (ZYLOPRIM) 100 MG tablet  - stable, continue allopurinol.  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  Need for vaccination - Plan: Flu Vaccine QUAD High Dose(Fluad)  Acute right-sided low back pain without sciatica  -Possible strain/overuse favoring left leg, no red flags on exam or history, imaging deferred at this time, symptomatic care with Tylenol, has Robaxin at home, RTC precautions.  Hyperglycemia - Plan: Hemoglobin A1c  -Previous prediabetes, updated A1c ordered with recent hyperglycemia but likely not fasting at that time.  Hyperlipidemia, unspecified hyperlipidemia type - Plan: Lipid panel  -Mild hyperlipidemia, repeat testing.  No current meds.  Meds ordered this encounter  Medications  . allopurinol (ZYLOPRIM) 100 MG tablet    Sig: Take 1 tablet (100 mg total) by mouth daily.    Dispense:  90 tablet    Refill:  2  . amLODipine  (NORVASC) 5 MG tablet    Sig: Take 1 tablet (5 mg total) by mouth daily.  Dispense:  90 tablet    Refill:  2  . doxazosin (CARDURA) 2 MG tablet    Sig: Take 1 tablet (2 mg total) by mouth daily.    Dispense:  90 tablet    Refill:  2  . lisinopril (ZESTRIL) 20 MG tablet    Sig: Take 1 tablet (20 mg total) by mouth daily.    Dispense:  90 tablet    Refill:  2   Patient Instructions   No med changes for now.  Can try robaxin if needed for back pain/spasm, tylenol over the counter is fine for now. If back not improving in 2 weeks - return for possible xray, other treatment.   Return to the clinic or go to the nearest emergency room if any of your symptoms worsen or new symptoms occur.   Acute Back Pain, Adult Acute back pain is sudden and usually short-lived. It is often caused by an injury to the muscles and tissues in the back. The injury may result from:  A muscle or ligament getting overstretched or torn (strained). Ligaments are tissues that connect bones to each other. Lifting something improperly can cause a back strain.  Wear and tear (degeneration) of the spinal disks. Spinal disks are circular tissue that provides cushioning between the bones of the spine (vertebrae).  Twisting motions, such as while playing sports or doing yard work.  A hit to the back.  Arthritis. You may have a physical exam, lab tests, and imaging tests to find the cause of your pain. Acute back pain usually goes away with rest and home care. Follow these instructions at home: Managing pain, stiffness, and swelling  Take over-the-counter and prescription medicines only as told by your health care provider.  Your health care provider may recommend applying ice during the first 24-48 hours after your pain starts. To do this: ? Put ice in a plastic bag. ? Place a towel between your skin and the bag. ? Leave the ice on for 20 minutes, 2-3 times a day.  If directed, apply heat to the affected area  as often as told by your health care provider. Use the heat source that your health care provider recommends, such as a moist heat pack or a heating pad. ? Place a towel between your skin and the heat source. ? Leave the heat on for 20-30 minutes. ? Remove the heat if your skin turns bright red. This is especially important if you are unable to feel pain, heat, or cold. You have a greater risk of getting burned. Activity   Do not stay in bed. Staying in bed for more than 1-2 days can delay your recovery.  Sit up and stand up straight. Avoid leaning forward when you sit, or hunching over when you stand. ? If you work at a desk, sit close to it so you do not need to lean over. Keep your chin tucked in. Keep your neck drawn back, and keep your elbows bent at a right angle. Your arms should look like the letter "L." ? Sit high and close to the steering wheel when you drive. Add lower back (lumbar) support to your car seat, if needed.  Take short walks on even surfaces as soon as you are able. Try to increase the length of time you walk each day.  Do not sit, drive, or stand in one place for more than 30 minutes at a time. Sitting or standing for long periods of time can put stress  on your back.  Do not drive or use heavy machinery while taking prescription pain medicine.  Use proper lifting techniques. When you bend and lift, use positions that put less stress on your back: ? [place] your knees. ? Keep the load close to your body. ? Avoid twisting.  Exercise regularly as told by your health care provider. Exercising helps your back heal faster and helps prevent back injuries by keeping muscles strong and flexible.  Work with a physical therapist to make a safe exercise program, as recommended by your health care provider. Do any exercises as told by your physical therapist. Lifestyle  Maintain a healthy weight. Extra weight puts stress on your back and makes it difficult to have good  posture.  Avoid activities or situations that make you feel anxious or stressed. Stress and anxiety increase muscle tension and can make back pain worse. Learn ways to manage anxiety and stress, such as through exercise. General instructions  Sleep on a firm mattress in a comfortable position. Try lying on your side with your knees slightly bent. If you lie on your back, put a pillow under your knees.  Follow your treatment plan as told by your health care provider. This may include: ? Cognitive or behavioral therapy. ? Acupuncture or massage therapy. ? Meditation or yoga. Contact a health care provider if:  You have pain that is not relieved with rest or medicine.  You have increasing pain going down into your legs or buttocks.  Your pain does not improve after 2 weeks.  You have pain at night.  You lose weight without trying.  You have a fever or chills. Get help right away if:  You develop new bowel or bladder control problems.  You have unusual weakness or numbness in your arms or legs.  You develop nausea or vomiting.  You develop abdominal pain.  You feel faint. Summary  Acute back pain is sudden and usually short-lived.  Use proper lifting techniques. When you bend and lift, use positions that put less stress on your back.  Take over-the-counter and prescription medicines and apply heat or ice as directed by your health care provider. This information is not intended to replace advice given to you by your health care provider. Make sure you discuss any questions you have with your health care provider. Document Revised: 05/10/2018 Document Reviewed: 09/02/2016 Elsevier Patient Education  [employer].     If you have lab work done today you will be contacted with your lab results within the next 2 weeks.  If you have not heard from [place] then please contact us. The fastest way to get your results is to register for My Chart.   IF you received an x-ray  today, you will receive an invoice from [facility]. Please contact [facility] at [phone] with questions or concerns regarding your invoice.   IF you received labwork today, you will receive an invoice from [facility]. Please contact [facility] at [phone] with questions or concerns regarding your invoice.   Our billing staff will not be able to assist you with questions regarding bills from these companies.  You will be contacted with the lab results as soon as they are available. The fastest way to get your results is to activate your My Chart account. Instructions are located on the last page of this paperwork. If you have not heard from [place] regarding the results in 2 weeks, please contact this office.  Signed, Merri Ray, MD [facility]

## 2019-11-20 NOTE — Patient Instructions (Addendum)
No med changes for now.  Can try robaxin if needed for back pain/spasm, tylenol over the counter is fine for now. If back not improving in 2 weeks - return for possible xray, other treatment.   Return to the clinic or go to the nearest emergency room if any of your symptoms worsen or new symptoms occur.   Acute Back Pain, Adult Acute back pain is sudden and usually short-lived. It is often caused by an injury to the muscles and tissues in the back. The injury may result from:  A muscle or ligament getting overstretched or torn (strained). Ligaments are tissues that connect bones to each other. Lifting something improperly can cause a back strain.  Wear and tear (degeneration) of the spinal disks. Spinal disks are circular tissue that provides cushioning between the bones of the spine (vertebrae).  Twisting motions, such as while playing sports or doing yard work.  A hit to the back.  Arthritis. You may have a physical exam, lab tests, and imaging tests to find the cause of your pain. Acute back pain usually goes away with rest and home care. Follow these instructions at home: Managing pain, stiffness, and swelling  Take over-the-counter and prescription medicines only as told by your health care provider.  Your health care provider may recommend applying ice during the first 24-48 hours after your pain starts. To do this: ? Put ice in a plastic bag. ? Place a towel between your skin and the bag. ? Leave the ice on for 20 minutes, 2-3 times a day.  If directed, apply heat to the affected area as often as told by your health care provider. Use the heat source that your health care provider recommends, such as a moist heat pack or a heating pad. ? Place a towel between your skin and the heat source. ? Leave the heat on for 20-30 minutes. ? Remove the heat if your skin turns bright red. This is especially important if you are unable to feel pain, heat, or cold. You have a greater risk of  getting burned. Activity   Do not stay in bed. Staying in bed for more than 1-2 days can delay your recovery.  Sit up and stand up straight. Avoid leaning forward when you sit, or hunching over when you stand. ? If you work at a desk, sit close to it so you do not need to lean over. Keep your chin tucked in. Keep your neck drawn back, and keep your elbows bent at a right angle. Your arms should look like the letter "L." ? Sit high and close to the steering wheel when you drive. Add lower back (lumbar) support to your car seat, if needed.  Take short walks on even surfaces as soon as you are able. Try to increase the length of time you walk each day.  Do not sit, drive, or stand in one place for more than 30 minutes at a time. Sitting or standing for long periods of time can put stress on your back.  Do not drive or use heavy machinery while taking prescription pain medicine.  Use proper lifting techniques. When you bend and lift, use positions that put less stress on your back: ? [place] your knees. ? Keep the load close to your body. ? Avoid twisting.  Exercise regularly as told by your health care provider. Exercising helps your back heal faster and helps prevent back injuries by keeping muscles strong and flexible.  Work with a physical  therapist to make a safe exercise program, as recommended by your health care provider. Do any exercises as told by your physical therapist. Lifestyle  Maintain a healthy weight. Extra weight puts stress on your back and makes it difficult to have good posture.  Avoid activities or situations that make you feel anxious or stressed. Stress and anxiety increase muscle tension and can make back pain worse. Learn ways to manage anxiety and stress, such as through exercise. General instructions  Sleep on a firm mattress in a comfortable position. Try lying on your side with your knees slightly bent. If you lie on your back, put a pillow under your  knees.  Follow your treatment plan as told by your health care provider. This may include: ? Cognitive or behavioral therapy. ? Acupuncture or massage therapy. ? Meditation or yoga. Contact a health care provider if:  You have pain that is not relieved with rest or medicine.  You have increasing pain going down into your legs or buttocks.  Your pain does not improve after 2 weeks.  You have pain at night.  You lose weight without trying.  You have a fever or chills. Get help right away if:  You develop new bowel or bladder control problems.  You have unusual weakness or numbness in your arms or legs.  You develop nausea or vomiting.  You develop abdominal pain.  You feel faint. Summary  Acute back pain is sudden and usually short-lived.  Use proper lifting techniques. When you bend and lift, use positions that put less stress on your back.  Take over-the-counter and prescription medicines and apply heat or ice as directed by your health care provider. This information is not intended to replace advice given to you by your health care provider. Make sure you discuss any questions you have with your health care provider. Document Revised: 05/10/2018 Document Reviewed: 09/02/2016 Elsevier Patient Education  [employer].     If you have lab work done today you will be contacted with your lab results within the next 2 weeks.  If you have not heard from [place] then please contact us. The fastest way to get your results is to register for My Chart.   IF you received an x-ray today, you will receive an invoice from [facility]. Please contact [facility] at [phone] with questions or concerns regarding your invoice.   IF you received labwork today, you will receive an invoice from [place]. Please contact [facility] at [phone] with questions or concerns regarding your invoice.   Our billing staff will not be able to assist you with questions  regarding bills from these companies.  You will be contacted with the lab results as soon as they are available. The fastest way to get your results is to activate your My Chart account. Instructions are located on the last page of this paperwork. If you have not heard from [place] regarding the results in 2 weeks, please contact this office.

## 2019-11-21 LAB — HEMOGLOBIN A1C
Est. average glucose Bld gHb Est-mCnc: 91 mg/dL
Hgb A1c MFr Bld: 4.8 % (ref 4.8–5.6)

## 2019-11-21 LAB — LIPID PANEL
Chol/HDL Ratio: 2.8 ratio (ref 0.0–5.0)
Cholesterol, Total: 223 mg/dL — ABNORMAL HIGH (ref 100–199)
HDL: 81 mg/dL (ref >39)
LDL Chol Calc (NIH): 125 mg/dL — ABNORMAL HIGH (ref 0–99)
Triglycerides: 96 mg/dL (ref 0–149)
VLDL Cholesterol Cal: 17 mg/dL (ref 5–40)

## 2019-11-21 LAB — COMPREHENSIVE METABOLIC PANEL
ALT: 13 IU/L (ref 0–44)
AST: 19 IU/L (ref 0–40)
Albumin/Globulin Ratio: 1.9 (ref 1.2–2.2)
Albumin: 4.6 g/dL (ref 3.8–4.8)
Alkaline Phosphatase: 49 IU/L (ref 44–121)
BUN/Creatinine Ratio: 17 (ref 10–24)
BUN: 19 mg/dL (ref 8–27)
Bilirubin Total: 0.5 mg/dL (ref 0.0–1.2)
CO2: 27 mmol/L (ref 20–29)
Calcium: 9.7 mg/dL (ref 8.6–10.2)
Chloride: 105 mmol/L (ref 96–106)
Creatinine, Ser: 1.11 mg/dL (ref 0.76–1.27)
GFR calc Af Amer: 77 mL/min/{1.73_m2} (ref >59)
GFR calc non Af Amer: 67 mL/min/{1.73_m2} (ref >59)
Globulin, Total: 2.4 g/dL (ref 1.5–4.5)
Glucose: 96 mg/dL (ref 65–99)
Potassium: 3.9 mmol/L (ref 3.5–5.2)
Sodium: 142 mmol/L (ref 134–144)
Total Protein: 7 g/dL (ref 6.0–8.5)

## 2019-12-07 ENCOUNTER — Ambulatory Visit (INDEPENDENT_AMBULATORY_CARE_PROVIDER_SITE_OTHER): Payer: Medicare HMO | Admitting: Orthopedic Surgery

## 2019-12-07 DIAGNOSIS — S76112A Strain of left quadriceps muscle, fascia and tendon, initial encounter: Secondary | ICD-10-CM

## 2019-12-10 ENCOUNTER — Encounter: Payer: Self-pay | Admitting: Orthopedic Surgery

## 2019-12-10 NOTE — Progress Notes (Signed)
   Post-Op Visit Note   Patient: Matthew Simon           Date of Birth: 1950-04-24           MRN: [id number] Visit Date: 12/07/2019 PCP: Wendie Agreste, MD   Assessment & Plan:  Chief Complaint:  Chief Complaint  Patient presents with  . Post-op Follow-up   Visit Diagnoses:  1. Rupture of left quadriceps tendon, initial encounter     Plan: Matthew Simon is now about 3 to 4 months out from quad tendon repair. On exam he is got about a 10 degree extensor lag but excellent strength. Quad atrophy is present. Plan to continue with physical therapy to start working on some lateral movement. Anticipate return to tennis in the spring. Come back in 2 months for final check before returning to tennis.  Follow-Up Instructions: No follow-ups on file.   Orders:  No orders of the defined types were placed in this encounter.  No orders of the defined types were placed in this encounter.   Imaging: No results found.  PMFS History: Patient Active Problem List   Diagnosis Date Noted  . Rupture of left quadriceps tendon 08/09/2019  . Soft tissue mass 05/22/2017  . Gout 05/05/2016  . Hypertension 04/10/2011  . Colon polyp 04/10/2011   Past Medical History:  Diagnosis Date  . Gout   . Hypertension     Family History  Problem Relation Age of Onset  . Cancer Mother        lymphoscarcoma  . Stroke Father   . Colon cancer Neg Hx   . Rectal cancer Neg Hx     Past Surgical History:  Procedure Laterality Date  . COLONOSCOPY    . EXCISION MASS NECK N/A 05/24/2017   Procedure: EXCISION POSTERIOR NECK MASS;  Surgeon: Armandina Gemma, MD;  Location: [place];  Service: General;  Laterality: N/A;  . QUADRICEPS TENDON REPAIR Left 08/10/2019   Procedure: REPAIR QUADRICEP TENDON RUPTURE;  Surgeon: Meredith Pel, MD;  Location: [place];  Service: Orthopedics;  Laterality: Left;  . WISDOM TOOTH EXTRACTION     Social History   Occupational History  . Occupation: business development  Tobacco Use    . Smoking status: Former Smoker    Quit date: 05/10/1964    Years since quitting: 55.6  . Smokeless tobacco: Never Used  Vaping Use  . Vaping Use: Never used  Substance and Sexual Activity  . Alcohol use: Yes    Alcohol/week: 21.0 standard drinks    Types: 21 Standard drinks or equivalent per week    Comment: wine with dinner  . Drug use: No  . Sexual activity: Not on file

## 2020-01-19 ENCOUNTER — Other Ambulatory Visit: Payer: Self-pay | Admitting: Family Medicine

## 2020-01-19 DIAGNOSIS — M109 Gout, unspecified: Secondary | ICD-10-CM

## 2020-04-10 ENCOUNTER — Telehealth: Payer: Self-pay

## 2020-04-10 DIAGNOSIS — K8689 Other specified diseases of pancreas: Secondary | ICD-10-CM

## 2020-04-10 DIAGNOSIS — R935 Abnormal findings on diagnostic imaging of other abdominal regions, including retroperitoneum: Secondary | ICD-10-CM

## 2020-04-10 DIAGNOSIS — D7389 Other diseases of spleen: Secondary | ICD-10-CM

## 2020-04-10 NOTE — Telephone Encounter (Signed)
Patient due for MR MRCP ABD W WO. Scheduled at [facility] on Tuesday 3-22 at 10:00am to arrive at 9:30, NPO 4 hours. MyChart message sent to patient and letter mailed to patient.

## 2020-04-10 NOTE — Telephone Encounter (Signed)
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 Last one 10-23-19

## 2020-04-23 ENCOUNTER — Other Ambulatory Visit: Payer: Self-pay

## 2020-04-23 ENCOUNTER — Other Ambulatory Visit: Payer: Self-pay | Admitting: Gastroenterology

## 2020-04-23 ENCOUNTER — Ambulatory Visit (HOSPITAL_COMMUNITY)
Admission: RE | Admit: 2020-04-23 | Discharge: 2020-04-23 | Disposition: A | Discharge status: Home / Self Care | Payer: Medicare HMO | Source: Ambulatory Visit | Attending: Gastroenterology | Admitting: Gastroenterology

## 2020-04-23 DIAGNOSIS — D7389 Other diseases of spleen: Secondary | ICD-10-CM

## 2020-04-23 DIAGNOSIS — K8689 Other specified diseases of pancreas: Secondary | ICD-10-CM

## 2020-04-23 DIAGNOSIS — D734 Cyst of spleen: Secondary | ICD-10-CM | POA: Diagnosis not present

## 2020-04-23 DIAGNOSIS — R935 Abnormal findings on diagnostic imaging of other abdominal regions, including retroperitoneum: Secondary | ICD-10-CM | POA: Diagnosis not present

## 2020-04-23 MED ORDER — GADOBUTROL 1 MMOL/ML IV SOLN
8.0000 mL | Freq: Once | INTRAVENOUS | Status: AC | PRN
  Administered 2020-04-23: 8 mL via INTRAVENOUS

## 2020-05-16 NOTE — Telephone Encounter (Signed)
Opened in error

## 2020-05-20 ENCOUNTER — Ambulatory Visit: Payer: Self-pay | Admitting: Family Medicine

## 2020-05-30 ENCOUNTER — Encounter: Payer: Self-pay | Admitting: Gastroenterology

## 2020-05-31 ENCOUNTER — Other Ambulatory Visit: Payer: Self-pay

## 2020-05-31 ENCOUNTER — Ambulatory Visit (INDEPENDENT_AMBULATORY_CARE_PROVIDER_SITE_OTHER): Payer: Medicare HMO | Admitting: Family Medicine

## 2020-05-31 ENCOUNTER — Encounter: Payer: Self-pay | Admitting: Family Medicine

## 2020-05-31 VITALS — BP 128/74 | HR 58 | Temp 98.1°F | Resp 17 | Ht 70.0 in | Wt 183.6 lb

## 2020-05-31 DIAGNOSIS — I10 Essential (primary) hypertension: Secondary | ICD-10-CM

## 2020-05-31 DIAGNOSIS — R739 Hyperglycemia, unspecified: Secondary | ICD-10-CM | POA: Diagnosis not present

## 2020-05-31 DIAGNOSIS — E785 Hyperlipidemia, unspecified: Secondary | ICD-10-CM | POA: Diagnosis not present

## 2020-05-31 DIAGNOSIS — K429 Umbilical hernia without obstruction or gangrene: Secondary | ICD-10-CM

## 2020-05-31 DIAGNOSIS — Z23 Encounter for immunization: Secondary | ICD-10-CM

## 2020-05-31 DIAGNOSIS — M109 Gout, unspecified: Secondary | ICD-10-CM

## 2020-05-31 DIAGNOSIS — Z0000 Encounter for general adult medical examination without abnormal findings: Secondary | ICD-10-CM | POA: Diagnosis not present

## 2020-05-31 DIAGNOSIS — R300 Dysuria: Secondary | ICD-10-CM

## 2020-05-31 LAB — CBC WITH DIFFERENTIAL/PLATELET
Basophils Absolute: 0 10*3/uL (ref 0.0–0.1)
Basophils Relative: 1 % (ref 0.0–3.0)
Eosinophils Absolute: 0 10*3/uL (ref 0.0–0.7)
Eosinophils Relative: 0.9 % (ref 0.0–5.0)
HCT: 39.2 % (ref 39.0–52.0)
Hemoglobin: 12.8 g/dL — ABNORMAL LOW (ref 13.0–17.0)
Lymphocytes Relative: 28.7 % (ref 12.0–46.0)
Lymphs Abs: 1.4 10*3/uL (ref 0.7–4.0)
MCHC: 32.5 g/dL (ref 30.0–36.0)
MCV: 89.6 fl (ref 78.0–100.0)
Monocytes Absolute: 0.3 10*3/uL (ref 0.1–1.0)
Monocytes Relative: 6.9 % (ref 3.0–12.0)
Neutro Abs: 3 10*3/uL (ref 1.4–7.7)
Neutrophils Relative %: 62.5 % (ref 43.0–77.0)
Platelets: 256 10*3/uL (ref 150.0–400.0)
RBC: 4.38 Mil/uL (ref 4.22–5.81)
RDW: 12.8 % (ref 11.5–15.5)
WBC: 4.8 10*3/uL (ref 4.0–10.5)

## 2020-05-31 LAB — HEMOGLOBIN A1C: Hgb A1c MFr Bld: 4.8 % (ref 4.6–6.5)

## 2020-05-31 LAB — LIPID PANEL
Cholesterol: 183 mg/dL (ref 0–200)
HDL: 76.2 mg/dL (ref >39.00)
LDL Cholesterol: 97 mg/dL (ref 0–99)
NonHDL: 107.08
Total CHOL/HDL Ratio: 2
Triglycerides: 50 mg/dL (ref 0.0–149.0)
VLDL: 10 mg/dL (ref 0.0–40.0)

## 2020-05-31 LAB — COMPREHENSIVE METABOLIC PANEL
ALT: 16 U/L (ref 0–53)
AST: 21 U/L (ref 0–37)
Albumin: 4.6 g/dL (ref 3.5–5.2)
Alkaline Phosphatase: 41 U/L (ref 39–117)
BUN: 16 mg/dL (ref 6–23)
CO2: 30 mEq/L (ref 19–32)
Calcium: 9.8 mg/dL (ref 8.4–10.5)
Chloride: 103 mEq/L (ref 96–112)
Creatinine, Ser: 1.25 mg/dL (ref 0.40–1.50)
GFR: 58.19 mL/min — ABNORMAL LOW (ref >60.00)
Glucose, Bld: 75 mg/dL (ref 70–99)
Potassium: 3.6 mEq/L (ref 3.5–5.1)
Sodium: 141 mEq/L (ref 135–145)
Total Bilirubin: 0.8 mg/dL (ref 0.2–1.2)
Total Protein: 7.2 g/dL (ref 6.0–8.3)

## 2020-05-31 MED ORDER — ALLOPURINOL 100 MG PO TABS: 100.0000 mg | ORAL_TABLET | Freq: Every day | ORAL | 3 refills | Status: DC

## 2020-05-31 MED ORDER — DOXAZOSIN MESYLATE 2 MG PO TABS: 2.0000 mg | ORAL_TABLET | Freq: Every day | ORAL | 3 refills | Status: DC

## 2020-05-31 MED ORDER — AMLODIPINE BESYLATE 5 MG PO TABS: 5.0000 mg | ORAL_TABLET | Freq: Every day | ORAL | 3 refills | Status: DC

## 2020-05-31 MED ORDER — LISINOPRIL 20 MG PO TABS: 20.0000 mg | ORAL_TABLET | Freq: Every day | ORAL | 3 refills | Status: DC

## 2020-05-31 NOTE — Progress Notes (Signed)
Subjective:  Patient ID: Matthew Simon, male    DOB: 1950-12-27  Age: 70 y.o. MRN: [id number]  CC:  Chief Complaint  Patient presents with  . Annual Exam    Pt reports an umbilical hernia sts has discussed prev. But would like to revisit this as hes having some more pain. Previously sent to GI for this issue pt had seen them once but has not been back     HPI Matthew Simon presents for   Annual physical/wellness exam, concerns about hernia, med review.   Care team: PCP - me GI: Dr. Havery Moros Ortho: Dr. Marlou Sa Urology: Junious Silk  Hyperlipidemia: No current meds.  Lab Results  Component Value Date   CHOL 223 (H) 11/20/2019   HDL 81 11/20/2019   LDLCALC 125 (H) 11/20/2019   TRIG 96 11/20/2019   CHOLHDL 2.8 11/20/2019   Lab Results  Component Value Date   ALT 13 11/20/2019   AST 19 11/20/2019   ALKPHOS 49 11/20/2019   BILITOT 0.5 11/20/2019      Hypertension: amlodipine 5 mg daily, lisinopril 20 mg daily, also on doxazosin 2 mg daily Home readings: usually 120/70 range. No new side effects with meds.  Hx of hyperglycemia (glucose 139), last A1c normal.  Lab Results  Component Value Date   HGBA1C 4.8 11/20/2019    BP Readings from Last 3 Encounters:  05/31/20 128/74  11/20/19 117/67  08/12/19 (!) 155/82   Lab Results  Component Value Date   CREATININE 1.11 11/20/2019    Gout: Last flare: no recent flare Daily meds: Allopurinol 100 mg daily, no new side effects.  Prn med: none.  Lab Results  Component Value Date   LABURIC 5.1 03/21/2018    Benign prostatic hypertrophy Followed by urology, Dr. Junious Silk, treated with doxazosin. Urinating ok. Stable. Nocturia - less frequent. Once per night.   Followed by gastroenterology, Dr. 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  MRCP indicated 4 mm pancreatic duct, not 9 mm as initially seen on CT.  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  No additional imaging follow-up given 1 year stability.  Similar mild pancreatic ductal dilatation without pancreatic mass or other obstructive etiology visualized. Possible repeat OV in 6 months per pt.   Possible hernia: Midline of abdomen. Present for years, sometimes hard or sore past month or sore. No n/v/bowel changes. No redness or skin changes.  Has not seen surgery for this in past.   Cancer screening: Colonoscopy in 05/2017 - repeat in 3 years - he will discuss with gastroenterology.  Prostate: followed by urology. Dr Junious Silk  Lab Results  Component Value Date   PSA1 1.6 11/23/2018   PSA1 1.3 03/21/2018   PSA1 1.5 05/05/2016   PSA 20.47 (H) 12/24/2014   PSA 1.09 02/12/2012   Immunization History  Administered Date(s) Administered  . Fluad Quad(high Dose 65+) 11/20/2019  . Influenza, Seasonal, Injecte, Preservative Fre 02/12/2012  . Influenza,inj,Quad PF,6+ Mos 12/24/2014  . PFIZER(Purple Top)SARS-COV-2 Vaccination 03/01/2019, 03/22/2019  . Pneumococcal Conjugate-13 02/21/2015  . Pneumococcal Polysaccharide-23 02/26/2010, 05/05/2016, 08/12/2019  . Zoster Recombinat (Shingrix) 05/31/2020  shingrix: agrees to today.    Fall Risk  05/31/2020 11/20/2019 01/04/2019 11/23/2018 10/06/2018  Falls in the past year? 0 1 0 0 0  Number falls in past yr: - 0 0 0 0  Injury with Fall? - 1 0 0 0  Follow up Falls  evaluation completed Falls evaluation completed Falls evaluation completed Falls evaluation completed -   Adequate lighting in home.  Stairs: 1 flight, has handrail.  Loose rugs - has area rugs. Fall risk discussed.  Bathroom - no grab bars. Has shower mats.   Depression screen [facility] 2/9 05/31/2020 11/20/2019 01/04/2019 11/23/2018 10/06/2018  Decreased Interest 0 0 0 0 0  Down, Depressed, Hopeless 0 0 0 0 0  PHQ - 2 Score 0 0 0 0 0   Functional Status Survey: Is the patient deaf or have difficulty hearing?: No Does the patient have difficulty  seeing, even when wearing glasses/contacts?: No Does the patient have difficulty concentrating, remembering, or making decisions?: No Does the patient have difficulty walking or climbing stairs?: No (knee surgery) Does the patient have difficulty dressing or bathing?: No Does the patient have difficulty doing errands alone such as visiting a doctor's office or shopping?: No  6CIT Screen 05/31/2020  What Year? 0 points  What month? 0 points  What time? 0 points  Count back from 20 0 points  Months in reverse 0 points  Repeat phrase 0 points  Total Score 0   [facility] Office Visit from 05/31/2020 in [facility]  AUDIT-C Score 4    denies alcohol addiction, DUI or problem drinking.    Hearing Screening   125Hz  250Hz  500Hz  1000Hz  2000Hz  3000Hz  4000Hz  6000Hz  8000Hz   Right ear:           Left ear:             Visual Acuity Screening   Right eye Left eye Both eyes  Without correction:     With correction: 20/20-1 20/20-2 20/20  optho - overdue - plans to schedule.   Dental: every 6 months, cleaning pending.   Exercise: walking - at least 3 days per week, 2 miles.   advanced directives: does not have. Info provided.    History Patient Active Problem List   Diagnosis Date Noted  . Rupture of left quadriceps tendon 08/09/2019  . Soft tissue mass 05/22/2017  . Gout 05/05/2016  . Hypertension 04/10/2011  . Colon polyp 04/10/2011   Past Medical History:  Diagnosis Date  . Gout   . Hypertension    Past Surgical History:  Procedure Laterality Date  . ARTHROSCOPIC REPAIR ACL Left    apx July   . COLONOSCOPY    . EXCISION MASS NECK N/A 05/24/2017   Procedure: EXCISION POSTERIOR NECK MASS;  Surgeon: Armandina Gemma, MD;  Location: [facility];  Service: General;  Laterality: N/A;  . QUADRICEPS TENDON REPAIR Left 08/10/2019   Procedure: REPAIR QUADRICEP TENDON RUPTURE;  Surgeon: Meredith Pel, MD;  Location: [facility];  Service: Orthopedics;   Laterality: Left;  . WISDOM TOOTH EXTRACTION     No Known Allergies Prior to Admission medications   Medication Sig Start Date End Date Taking? Authorizing Provider  allopurinol (ZYLOPRIM) 100 MG tablet TAKE 1 TABLET BY MOUTH EVERY DAY 01/19/20  Yes Wendie Agreste, MD  amLODipine (NORVASC) 5 MG tablet Take 1 tablet (5 mg total) by mouth daily. 11/20/19  Yes Wendie Agreste, MD  doxazosin (CARDURA) 2 MG tablet Take 1 tablet (2 mg total) by mouth daily. 11/20/19  Yes Wendie Agreste, MD  lisinopril (ZESTRIL) 20 MG tablet Take 1 tablet (20 mg total) by mouth daily. 11/20/19  Yes Wendie Agreste, MD   Social History   Socioeconomic History  . Marital status: Married  Spouse name: Not on file  . Number of children: Not on file  . Years of education: Not on file  . Highest education level: Not on file  Occupational History  . Occupation: business development  Tobacco Use  . Smoking status: Former Smoker    Quit date: 05/10/1964    Years since quitting: 56.0  . Smokeless tobacco: Never Used  Vaping Use  . Vaping Use: Never used  Substance and Sexual Activity  . Alcohol use: Yes    Alcohol/week: 20.0 standard drinks    Types: 10 Glasses of wine, 10 Standard drinks or equivalent per week    Comment: wine with dinner  . Drug use: No  . Sexual activity: Not Currently  Other Topics Concern  . Not on file  Social History Narrative  . Not on file   Social Determinants of Health   Financial Resource Strain: Not on file  Food Insecurity: Not on file  Transportation Needs: Not on file  Physical Activity: Not on file  Stress: Not on file  Social Connections: Not on file  Intimate Partner Violence: Not on file    Review of Systems 13 point review of systems per patient health survey noted.  Negative other than as indicated above or in HPI.    Objective:   Vitals:   05/31/20 0913  BP: 128/74  Pulse: (!) 58  Resp: 17  Temp: 98.1 F (36.7 C)  TempSrc: Temporal   SpO2: 100%  Weight: 183 lb 9.6 oz (83.3 kg)  Height: 5\' 10"  (1.778 m)     Physical Exam Vitals reviewed.  Constitutional:      Appearance: He is well-developed.  HENT:     Head: Normocephalic and atraumatic.     Right Ear: External ear normal.     Left Ear: External ear normal.  Eyes:     Conjunctiva/sclera: Conjunctivae normal.     Pupils: Pupils are equal, round, and reactive to light.  Neck:     Thyroid: No thyromegaly.  Cardiovascular:     Rate and Rhythm: Normal rate and regular rhythm.     Heart sounds: Normal heart sounds.  Pulmonary:     Effort: Pulmonary effort is normal. No respiratory distress.     Breath sounds: Normal breath sounds. No wheezing.  Abdominal:     General: There is no distension.     Palpations: Abdomen is soft.     Tenderness: There is no abdominal tenderness.     Hernia: A hernia is present. Hernia is present in the umbilical area (reducible, nontender. no skin changes. ).  Musculoskeletal:        General: No tenderness. Normal range of motion.     Cervical back: Normal range of motion and neck supple.  Lymphadenopathy:     Cervical: No cervical adenopathy.  Skin:    General: Skin is warm and dry.  Neurological:     Mental Status: He is alert and oriented to person, place, and time.     Deep Tendon Reflexes: Reflexes are normal and symmetric.  Psychiatric:        Behavior: Behavior normal.        Assessment & Plan:  Matthew Simon is a 70 y.o. male . Encounter for Medicare annual wellness exam  - - anticipatory guidance as below in AVS, screening labs if needed. Health maintenance items as above in HPI discussed/recommended as applicable.  - no concerning responses on depression, fall, or functional status screening. Any positive responses noted  as above. Advanced directives discussed as in [facility].   Gout, unspecified cause, unspecified chronicity, unspecified site - Plan: CBC with Differential/Platelet, allopurinol (ZYLOPRIM) 100 MG  tablet  -Stable, continue allopurinol  Essential hypertension - Plan: CBC with Differential/Platelet, Comprehensive metabolic panel, amLODipine (NORVASC) 5 MG tablet, doxazosin (CARDURA) 2 MG tablet, lisinopril (ZESTRIL) 20 MG tablet  -Stable control with current regimen, continue same.  Labs as above  Dysuria - Plan: doxazosin (CARDURA) 2 MG tablet  -BPH overall stable with doxazosin, continue follow-up with urology as needed.  Hyperglycemia - Plan: Hemoglobin A1c  -Previous hyperglycemia, repeat A1c but most recent testing normal.  Hyperlipidemia, unspecified hyperlipidemia type - Plan: CBC with Differential/Platelet, Lipid panel  -Check labs to decide on need for treatment.  No current statin.  Need for shingles vaccine - Plan: Varicella-zoster vaccine IM  Umbilical hernia without obstruction and without gangrene - Plan: Ambulatory referral to General Surgery  -Episodic discomfort but no sign of incarcerated or strangulated hernia at this time.  Hernia precautions, ER precautions given.  Refer to general surgery.  Meds ordered this encounter  Medications  . allopurinol (ZYLOPRIM) 100 MG tablet    Sig: Take 1 tablet (100 mg total) by mouth daily.    Dispense:  90 tablet    Refill:  3    DX Code Needed  .  Marland Kitchen amLODipine (NORVASC) 5 MG tablet    Sig: Take 1 tablet (5 mg total) by mouth daily.    Dispense:  90 tablet    Refill:  3  . doxazosin (CARDURA) 2 MG tablet    Sig: Take 1 tablet (2 mg total) by mouth daily.    Dispense:  90 tablet    Refill:  3  . lisinopril (ZESTRIL) 20 MG tablet    Sig: Take 1 tablet (20 mg total) by mouth daily.    Dispense:  90 tablet    Refill:  3   Patient Instructions   I recommend calling gastroenterology about timing of colonoscopy as you appear to be due. Repeat shingles vaccine in 2-6 months.    I will refer you for the hernia to a general surgeon. See precautions below, but if acute worsening be seen in urgent care or the ER if needed. I  do not expect that to happen.   No med changes today. Thanks for coming in today.    Umbilical Hernia, Adult  A hernia is a bulge of tissue that pushes through an opening between muscles. An umbilical hernia happens in the abdomen, near the belly button (umbilicus). The hernia may contain tissues from the small intestine, large intestine, or fatty tissue covering the intestines (omentum). Umbilical hernias in adults tend to get worse over time, and they require surgical treatment. There are several types of umbilical hernias. You may have:  A hernia located just above or below the umbilicus (indirect hernia). This is the most common type of umbilical hernia in adults.  A hernia that forms through an opening formed by the umbilicus (direct hernia).  A hernia that comes and goes (reducible hernia). A reducible hernia may be visible only when you strain, lift something heavy, or cough. This type of hernia can be pushed back into the abdomen (reduced).  A hernia that traps abdominal tissue inside the hernia (incarcerated hernia). This type of hernia cannot be reduced.  A hernia that cuts off blood flow to the tissues inside the hernia (strangulated hernia). The tissues can start to die if this happens. This type  of hernia requires emergency treatment. What are the causes? An umbilical hernia happens when tissue inside the abdomen presses on a weak area of the abdominal muscles. What increases the risk? You may have a greater risk of this condition if you:  Are obese.  Have had several pregnancies.  Have a buildup of fluid inside your abdomen (ascites).  Have had surgery that weakens the abdominal muscles. What are the signs or symptoms? The main symptom of this condition is a painless bulge at or near the belly button. A reducible hernia may be visible only when you strain, lift something heavy, or cough. Other symptoms may include:  Dull pain.  A feeling of pressure. Symptoms of a  strangulated hernia may include:  Pain that gets increasingly worse.  Nausea and vomiting.  Pain when pressing on the hernia.  Skin over the hernia becoming red or purple.  Constipation.  Blood in the stool. How is this diagnosed? This condition may be diagnosed based on:  A physical exam. You may be asked to cough or strain while standing. These actions increase the pressure inside your abdomen and force the hernia through the opening in your muscles. Your health care provider may try to reduce the hernia by pressing on it.  Your symptoms and medical history. How is this treated? Surgery is the only treatment for an umbilical hernia. Surgery for a strangulated hernia is done as soon as possible. If you have a small hernia that is not incarcerated, you may need to lose weight before having surgery. Follow these instructions at home:  Lose weight, if told by your health care provider.  Do not try to push the hernia back in.  Watch your hernia for any changes in color or size. Tell your health care provider if any changes occur.  You may need to avoid activities that increase pressure on your hernia.  Do not lift anything that is heavier than 10 lb (4.5 kg) until your health care provider says that this is safe.  Take over-the-counter and prescription medicines only as told by your health care provider.  Keep all follow-up visits as told by your health care provider. This is important. Contact a health care provider if:  Your hernia gets larger.  Your hernia becomes painful. Get help right away if:  You develop sudden, severe pain near the area of your hernia.  You have pain as well as nausea or vomiting.  You have pain and the skin over your hernia changes color.  You develop a fever. This information is not intended to replace advice given to you by your health care provider. Make sure you discuss any questions you have with your health care provider. Document  Revised: 03/03/2017 Document Reviewed: 07/20/2016 Elsevier Patient Education  [place] 65 Years and Older, Male Preventive care refers to lifestyle choices and visits with your health care provider that can promote health and wellness. This includes:  A yearly physical exam. This is also called an annual wellness visit.  Regular dental and eye exams.  Immunizations.  Screening for certain conditions.  Healthy lifestyle choices, such as: ? Eating a healthy diet. ? Getting regular exercise. ? Not using drugs or products that contain nicotine and tobacco. ? Limiting alcohol use. What can I expect for my preventive care visit? Physical exam Your health care provider will check your:  Height and weight. These may be used to calculate your BMI (body mass index). BMI is a measurement  that tells if you are at a healthy weight.  Heart rate and blood pressure.  Body temperature.  Skin for abnormal spots. Counseling Your health care provider may ask you questions about your:  Past medical problems.  Family's medical history.  Alcohol, tobacco, and drug use.  Emotional well-being.  Home life and relationship well-being.  Sexual activity.  Diet, exercise, and sleep habits.  History of falls.  Memory and ability to understand (cognition).  Work and work Statistician.  Access to firearms. What immunizations do I need? Vaccines are usually given at various ages, according to a schedule. Your health care provider will recommend vaccines for you based on your age, medical history, and lifestyle or other factors, such as travel or where you work.   What tests do I need? Blood tests  Lipid and cholesterol levels. These may be checked every 5 years, or more often depending on your overall health.  Hepatitis C test.  Hepatitis B test. Screening  Lung cancer screening. You may have this screening every year starting at age 83 if you have a  30-pack-year history of smoking and currently smoke or have quit within the past 15 years.  Colorectal cancer screening. ? All adults should have this screening starting at age 22 and continuing until age 81. ? Your health care provider may recommend screening at age 3 if you are at increased risk. ? You will have tests every 1-10 years, depending on your results and the type of screening test.  Prostate cancer screening. Recommendations will vary depending on your family history and other risks.  Genital exam to check for testicular cancer or hernias.  Diabetes screening. ? This is done by checking your blood sugar (glucose) after you have not eaten for a while (fasting). ? You may have this done every 1-3 years.  Abdominal aortic aneurysm (AAA) screening. You may need this if you are a current or former smoker.  STD (sexually transmitted disease) testing, if you are at risk. Follow these instructions at home: Eating and drinking  Eat a diet that includes fresh fruits and vegetables, whole grains, lean protein, and low-fat dairy products. Limit your intake of foods with high amounts of sugar, saturated fats, and salt.  Take vitamin and mineral supplements as recommended by your health care provider.  Do not drink alcohol if your health care provider tells you not to drink.  If you drink alcohol: ? Limit how much you have to 0-2 drinks a day. ? Be aware of how much alcohol is in your drink. In the [place] one drink equals one 12 oz bottle of beer (355 mL), one 5 oz glass of wine (148 mL), or one 1 oz glass of hard liquor (44 mL).   Lifestyle  Take daily care of your teeth and gums. Brush your teeth every morning and night with fluoride toothpaste. Floss one time each day.  Stay active. Exercise for at least 30 minutes 5 or more days each week.  Do not use any products that contain nicotine or tobacco, such as cigarettes, e-cigarettes, and chewing tobacco. If you need help  quitting, ask your health care provider.  Do not use drugs.  If you are sexually active, practice safe sex. Use a condom or other form of protection to prevent STIs (sexually transmitted infections).  Talk with your health care provider about taking a low-dose aspirin or statin.  Find healthy ways to cope with stress, such as: ? Meditation, yoga, or listening to music. ?  Journaling. ? Talking to a trusted person. ? Spending time with friends and family. Safety  Always wear your seat belt while driving or riding in a vehicle.  Do not drive: ? If you have been drinking alcohol. Do not ride with someone who has been drinking. ? When you are tired or distracted. ? While texting.  Wear a helmet and other protective equipment during sports activities.  If you have firearms in your house, make sure you follow all gun safety procedures. What's next?  Visit your health care provider once a year for an annual wellness visit.  Ask your health care provider how often you should have your eyes and teeth checked.  Stay up to date on all vaccines. This information is not intended to replace advice given to you by your health care provider. Make sure you discuss any questions you have with your health care provider. Document Revised: 10/18/2018 Document Reviewed: 01/13/2018 Elsevier Patient Education  2021 [place].      Signed, Merri Ray, MD [facility]

## 2020-05-31 NOTE — Patient Instructions (Addendum)
I recommend calling gastroenterology about timing of colonoscopy as you appear to be due. Repeat shingles vaccine in 2-6 months.    I will refer you for the hernia to a general surgeon. See precautions below, but if acute worsening be seen in urgent care or the ER if needed. I do not expect that to happen.   No med changes today. Thanks for coming in today.    Umbilical Hernia, Adult  A hernia is a bulge of tissue that pushes through an opening between muscles. An umbilical hernia happens in the abdomen, near the belly button (umbilicus). The hernia may contain tissues from the small intestine, large intestine, or fatty tissue covering the intestines (omentum). Umbilical hernias in adults tend to get worse over time, and they require surgical treatment. There are several types of umbilical hernias. You may have:  A hernia located just above or below the umbilicus (indirect hernia). This is the most common type of umbilical hernia in adults.  A hernia that forms through an opening formed by the umbilicus (direct hernia).  A hernia that comes and goes (reducible hernia). A reducible hernia may be visible only when you strain, lift something heavy, or cough. This type of hernia can be pushed back into the abdomen (reduced).  A hernia that traps abdominal tissue inside the hernia (incarcerated hernia). This type of hernia cannot be reduced.  A hernia that cuts off blood flow to the tissues inside the hernia (strangulated hernia). The tissues can start to die if this happens. This type of hernia requires emergency treatment. What are the causes? An umbilical hernia happens when tissue inside the abdomen presses on a weak area of the abdominal muscles. What increases the risk? You may have a greater risk of this condition if you:  Are obese.  Have had several pregnancies.  Have a buildup of fluid inside your abdomen (ascites).  Have had surgery that weakens the abdominal muscles. What are  the signs or symptoms? The main symptom of this condition is a painless bulge at or near the belly button. A reducible hernia may be visible only when you strain, lift something heavy, or cough. Other symptoms may include:  Dull pain.  A feeling of pressure. Symptoms of a strangulated hernia may include:  Pain that gets increasingly worse.  Nausea and vomiting.  Pain when pressing on the hernia.  Skin over the hernia becoming red or purple.  Constipation.  Blood in the stool. How is this diagnosed? This condition may be diagnosed based on:  A physical exam. You may be asked to cough or strain while standing. These actions increase the pressure inside your abdomen and force the hernia through the opening in your muscles. Your health care provider may try to reduce the hernia by pressing on it.  Your symptoms and medical history. How is this treated? Surgery is the only treatment for an umbilical hernia. Surgery for a strangulated hernia is done as soon as possible. If you have a small hernia that is not incarcerated, you may need to lose weight before having surgery. Follow these instructions at home:  Lose weight, if told by your health care provider.  Do not try to push the hernia back in.  Watch your hernia for any changes in color or size. Tell your health care provider if any changes occur.  You may need to avoid activities that increase pressure on your hernia.  Do not lift anything that is heavier than 10 lb (4.5 kg)  until your health care provider says that this is safe.  Take over-the-counter and prescription medicines only as told by your health care provider.  Keep all follow-up visits as told by your health care provider. This is important. Contact a health care provider if:  Your hernia gets larger.  Your hernia becomes painful. Get help right away if:  You develop sudden, severe pain near the area of your hernia.  You have pain as well as nausea or  vomiting.  You have pain and the skin over your hernia changes color.  You develop a fever. This information is not intended to replace advice given to you by your health care provider. Make sure you discuss any questions you have with your health care provider. Document Revised: 03/03/2017 Document Reviewed: 07/20/2016 Elsevier Patient Education  [place] 65 Years and Older, Male Preventive care refers to lifestyle choices and visits with your health care provider that can promote health and wellness. This includes:  A yearly physical exam. This is also called an annual wellness visit.  Regular dental and eye exams.  Immunizations.  Screening for certain conditions.  Healthy lifestyle choices, such as: ? Eating a healthy diet. ? Getting regular exercise. ? Not using drugs or products that contain nicotine and tobacco. ? Limiting alcohol use. What can I expect for my preventive care visit? Physical exam Your health care provider will check your:  Height and weight. These may be used to calculate your BMI (body mass index). BMI is a measurement that tells if you are at a healthy weight.  Heart rate and blood pressure.  Body temperature.  Skin for abnormal spots. Counseling Your health care provider may ask you questions about your:  Past medical problems.  Family's medical history.  Alcohol, tobacco, and drug use.  Emotional well-being.  Home life and relationship well-being.  Sexual activity.  Diet, exercise, and sleep habits.  History of falls.  Memory and ability to understand (cognition).  Work and work Statistician.  Access to firearms. What immunizations do I need? Vaccines are usually given at various ages, according to a schedule. Your health care provider will recommend vaccines for you based on your age, medical history, and lifestyle or other factors, such as travel or where you work.   What tests do I need? Blood  tests  Lipid and cholesterol levels. These may be checked every 5 years, or more often depending on your overall health.  Hepatitis C test.  Hepatitis B test. Screening  Lung cancer screening. You may have this screening every year starting at age 58 if you have a 30-pack-year history of smoking and currently smoke or have quit within the past 15 years.  Colorectal cancer screening. ? All adults should have this screening starting at age 87 and continuing until age 29. ? Your health care provider may recommend screening at age 59 if you are at increased risk. ? You will have tests every 1-10 years, depending on your results and the type of screening test.  Prostate cancer screening. Recommendations will vary depending on your family history and other risks.  Genital exam to check for testicular cancer or hernias.  Diabetes screening. ? This is done by checking your blood sugar (glucose) after you have not eaten for a while (fasting). ? You may have this done every 1-3 years.  Abdominal aortic aneurysm (AAA) screening. You may need this if you are a current or former smoker.  STD (sexually transmitted disease)  testing, if you are at risk. Follow these instructions at home: Eating and drinking  Eat a diet that includes fresh fruits and vegetables, whole grains, lean protein, and low-fat dairy products. Limit your intake of foods with high amounts of sugar, saturated fats, and salt.  Take vitamin and mineral supplements as recommended by your health care provider.  Do not drink alcohol if your health care provider tells you not to drink.  If you drink alcohol: ? Limit how much you have to 0-2 drinks a day. ? Be aware of how much alcohol is in your drink. In the [place] one drink equals one 12 oz bottle of beer (355 mL), one 5 oz glass of wine (148 mL), or one 1 oz glass of hard liquor (44 mL).   Lifestyle  Take daily care of your teeth and gums. Brush your teeth every morning and  night with fluoride toothpaste. Floss one time each day.  Stay active. Exercise for at least 30 minutes 5 or more days each week.  Do not use any products that contain nicotine or tobacco, such as cigarettes, e-cigarettes, and chewing tobacco. If you need help quitting, ask your health care provider.  Do not use drugs.  If you are sexually active, practice safe sex. Use a condom or other form of protection to prevent STIs (sexually transmitted infections).  Talk with your health care provider about taking a low-dose aspirin or statin.  Find healthy ways to cope with stress, such as: ? Meditation, yoga, or listening to music. ? Journaling. ? Talking to a trusted person. ? Spending time with friends and family. Safety  Always wear your seat belt while driving or riding in a vehicle.  Do not drive: ? If you have been drinking alcohol. Do not ride with someone who has been drinking. ? When you are tired or distracted. ? While texting.  Wear a helmet and other protective equipment during sports activities.  If you have firearms in your house, make sure you follow all gun safety procedures. What's next?  Visit your health care provider once a year for an annual wellness visit.  Ask your health care provider how often you should have your eyes and teeth checked.  Stay up to date on all vaccines. This information is not intended to replace advice given to you by your health care provider. Make sure you discuss any questions you have with your health care provider. Document Revised: 10/18/2018 Document Reviewed: 01/13/2018 Elsevier Patient Education  2021 [employer].

## 2020-07-10 DIAGNOSIS — K429 Umbilical hernia without obstruction or gangrene: Secondary | ICD-10-CM | POA: Diagnosis not present

## 2020-08-12 ENCOUNTER — Other Ambulatory Visit: Payer: Self-pay

## 2020-08-12 ENCOUNTER — Encounter: Payer: Self-pay | Admitting: Surgical

## 2020-08-12 ENCOUNTER — Ambulatory Visit (INDEPENDENT_AMBULATORY_CARE_PROVIDER_SITE_OTHER): Payer: Medicare HMO | Admitting: Surgical

## 2020-08-12 DIAGNOSIS — M25462 Effusion, left knee: Secondary | ICD-10-CM | POA: Diagnosis not present

## 2020-08-12 MED ORDER — METHYLPREDNISOLONE ACETATE 40 MG/ML IJ SUSP
40.0000 mg | INTRAMUSCULAR | Status: AC | PRN
  Administered 2020-08-12: 40 mg via INTRA_ARTICULAR

## 2020-08-12 MED ORDER — LIDOCAINE HCL 1 % IJ SOLN
5.0000 mL | INTRAMUSCULAR | Status: AC | PRN
  Administered 2020-08-12: 5 mL

## 2020-08-12 MED ORDER — BUPIVACAINE HCL 0.25 % IJ SOLN
4.0000 mL | INTRAMUSCULAR | Status: AC | PRN
  Administered 2020-08-12: 4 mL via INTRA_ARTICULAR

## 2020-08-12 NOTE — Progress Notes (Signed)
Office Visit Note   Patient: Matthew Simon           Date of Birth: 1950/04/06           MRN: [id number] Visit Date: 08/12/2020 Requested by: Wendie Agreste, MD [address] [place],  [place] [address] PCP: Wendie Agreste, MD  Subjective: Chief Complaint  Patient presents with   Left Knee - Edema    HPI: Matthew Simon is a 70 y.o. male who presents to the office s/p left quadricep tendon repair on 08/10/2019.  He reports that he has been doing very well up until last Friday when he had acute onset of left knee swelling without any injury.  He went to bed Thursday night without any symptoms and awoke Friday morning with large left knee effusion with increased warmth and some increased pain.  Denies any fevers, chills, night sweats, malaise.  No drainage.  No change in appearance of the incision.  He has not returned to playing tennis yet and mostly just walks for exercise..                ROS: All systems reviewed are negative as they relate to the chief complaint within the history of present illness.  Patient denies fevers or chills.  Assessment & Plan: Visit Diagnoses:  1. Effusion, left knee     Plan: Patient is a 70 year old male who presents complaining of left knee swelling.  He had acute onset of left knee effusion without injury Friday morning.  He does have history of gout and is compliant with taking his allopurinol.  Has not had a gout attack in several years.  No sign of infection and patient is not ill-appearing.  Aspirated left knee joint of approximately 80 cc of serosanguineous fluid which was sent off for synovial fluid analysis including cell count, gram stain, crystal analysis, anaerobic/aerobic culture.  Initial impression is acute gout attack but plan to call patient with results and further plan based on synovial fluid analysis.  No concern for reinjury to the quad tendon at this time.  Follow-Up Instructions: No follow-ups on file.   Orders:  Orders  Placed This Encounter  Procedures   Gram stain   Anaerobic and Aerobic Culture   Cell count + diff,  w/ cryst-synvl fld   Synovial Fluid Analysis, Complete   No orders of the defined types were placed in this encounter.     Procedures: Large Joint Inj: L knee on 08/12/2020 4:22 PM Indications: diagnostic evaluation, joint swelling and pain Details: 18 G 1.5 in needle, superolateral approach  Arthrogram: No  Medications: 5 mL lidocaine 1 %; 40 mg methylPREDNISolone acetate 40 MG/ML; 4 mL bupivacaine 0.25 % Aspirate: 80 mL serous, blood-tinged and yellow Outcome: tolerated well, no immediate complications Procedure, treatment alternatives, risks and benefits explained, specific risks discussed. Consent was given by the patient. Immediately prior to procedure a time out was called to verify the correct patient, procedure, equipment, support staff and site/side marked as required. Patient was prepped and draped in the usual sterile fashion.      Clinical Data: No additional findings.  Objective: Vital Signs: There were no vitals taken for this visit.  Physical Exam:  Constitutional: Patient appears well-developed HEENT:  Head: Normocephalic Eyes:EOM are normal Neck: Normal range of motion Cardiovascular: Normal rate Pulmonary/chest: Effort normal Neurologic: Patient is alert Skin: Skin is warm Psychiatric: Patient has normal mood and affect  Ortho Exam: Ortho exam demonstrates  left knee with 0 degrees extension and flexion to 120 degrees.  No pain with passive range of motion of the shoulder.  Increased warmth over the left knee with large effusion.  No calf tenderness.  Negative Homans' sign.  Able to perform straight leg raise.  Quadricep and patellar tendons are palpable and intact by palpation.  Excellent quadricep strength with no extensor lag while performing straight leg raise.  No medial or lateral joint line tenderness.  No tenderness or defect over the quad  tendon  Specialty Comments:  No specialty comments available.  Imaging: No results found.   PMFS History: Patient Active Problem List   Diagnosis Date Noted   Rupture of left quadriceps tendon 08/09/2019   Soft tissue mass 05/22/2017   Gout 05/05/2016   Hypertension 04/10/2011   Colon polyp 04/10/2011   Past Medical History:  Diagnosis Date   Gout    Hypertension     Family History  Problem Relation Age of Onset   Cancer Mother        lymphoscarcoma   Stroke Father    Colon cancer Neg Hx    Rectal cancer Neg Hx     Past Surgical History:  Procedure Laterality Date   ARTHROSCOPIC REPAIR ACL Left    apx July    COLONOSCOPY     EXCISION MASS NECK N/A 05/24/2017   Procedure: EXCISION POSTERIOR NECK MASS;  Surgeon: Armandina Gemma, MD;  Location: [facility];  Service: General;  Laterality: N/A;   QUADRICEPS TENDON REPAIR Left 08/10/2019   Procedure: REPAIR QUADRICEP TENDON RUPTURE;  Surgeon: Meredith Pel, MD;  Location: [facility];  Service: Orthopedics;  Laterality: Left;   WISDOM TOOTH EXTRACTION     Social History   Occupational History   Occupation: business development  Tobacco Use   Smoking status: Former    Pack years: 0.00    Types: Cigarettes    Quit date: 05/10/1964    Years since quitting: 56.2   Smokeless tobacco: Never  Vaping Use   Vaping Use: Never used  Substance and Sexual Activity   Alcohol use: Yes    Alcohol/week: 20.0 standard drinks    Types: 10 Glasses of wine, 10 Standard drinks or equivalent per week    Comment: wine with dinner   Drug use: No   Sexual activity: Not Currently

## 2020-08-18 LAB — SYNOVIAL FLUID ANALYSIS, COMPLETE
Basophils, %: 0 %
Eosinophils-Synovial: 0 % (ref 0–2)
Lymphocytes-Synovial Fld: 38 % (ref 0–74)
Monocyte/Macrophage: 28 % (ref 0–69)
Neutrophil, Synovial: 34 % — ABNORMAL HIGH (ref 0–24)
Synoviocytes, %: 0 % (ref 0–15)
WBC, Synovial: 128 cells/uL (ref <150)

## 2020-08-18 LAB — ANAEROBIC AND AEROBIC CULTURE
AER RESULT:: NO GROWTH
GRAM STAIN:: NONE SEEN
MICRO NUMBER:: 12103322
MICRO NUMBER:: 12103323
SPECIMEN QUALITY:: ADEQUATE
SPECIMEN QUALITY:: ADEQUATE

## 2020-08-21 ENCOUNTER — Telehealth: Payer: Self-pay

## 2020-08-21 NOTE — Telephone Encounter (Signed)
Patient called he is requesting a call back regarding his results from labs he completed call [phone]

## 2020-08-21 NOTE — Telephone Encounter (Signed)
Called and left VM detailing results

## 2020-11-25 ENCOUNTER — Ambulatory Visit: Payer: Medicare HMO | Admitting: Family Medicine

## 2020-11-25 DIAGNOSIS — I10 Essential (primary) hypertension: Secondary | ICD-10-CM

## 2020-11-25 DIAGNOSIS — E785 Hyperlipidemia, unspecified: Secondary | ICD-10-CM

## 2020-11-25 DIAGNOSIS — M109 Gout, unspecified: Secondary | ICD-10-CM

## 2020-11-25 DIAGNOSIS — R300 Dysuria: Secondary | ICD-10-CM

## 2020-11-25 DIAGNOSIS — R739 Hyperglycemia, unspecified: Secondary | ICD-10-CM

## 2020-12-16 ENCOUNTER — Telehealth: Payer: Self-pay | Admitting: *Deleted

## 2020-12-16 NOTE — Telephone Encounter (Signed)
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 Please schedule , has not been seen since 2020(Dr Stover).

## 2020-12-17 ENCOUNTER — Telehealth: Payer: Self-pay | Admitting: Family Medicine

## 2020-12-17 NOTE — Telephone Encounter (Signed)
Lvm for pt to call back. 

## 2021-02-05 IMAGING — MR MR 3D RECON AT [facility]
18 of 21 series · 40 of 48 positions shown · IV contrast (gadavist)
Comparison: 04/25/2019

CLINICAL DATA: Pancreatic ductal dilatation and indeterminate
splenic lesion.

EXAM:
MRI ABDOMEN WITHOUT AND WITH CONTRAST (INCLUDING MRCP)
TECHNIQUE: Multiplanar multisequence MR imaging of the abdomen was performed
both before and after the administration of intravenous contrast.
Heavily T2-weighted images of the biliary and pancreatic ducts were
obtained, and three-dimensional MRCP images were rendered by post
processing.
CONTRAST:  8mL GADAVIST GADOBUTROL 1 MMOL/ML IV SOLN

[Series 4: T2 fat-sat · axial · 6.0mm · 1.17mm/px · 1 of 42 slices shown]
[im 1/42]
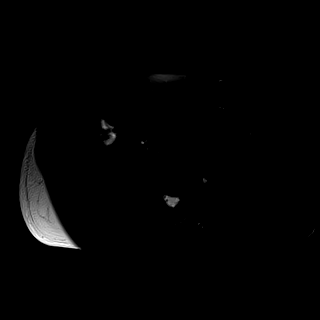

[Series 5: DWI · axial · 6.0mm · 1.40mm/px · z∈[-120,+154]mm · 2 of 78 slices shown (1 of 4)]
[im 1/78]
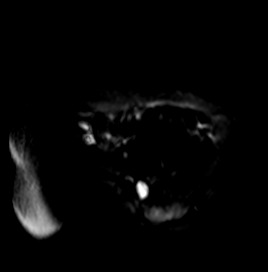
[im 78/78]
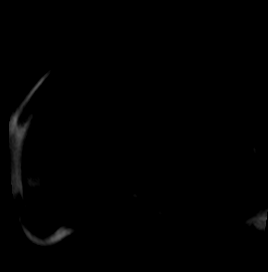

[Series 6: DWI · axial · 6.0mm · 1.40mm/px · 1 of 39 slices shown (2 of 4)]
[im 1/39]
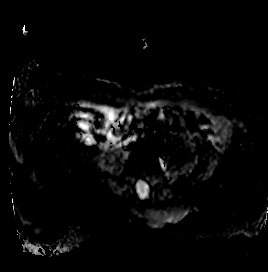

[Series 7: T1 · axial · 3.0mm · 1.17mm/px · z∈[-136,+125]mm · 2 of 88 slices shown (1 of 2)]
[im 1/88]
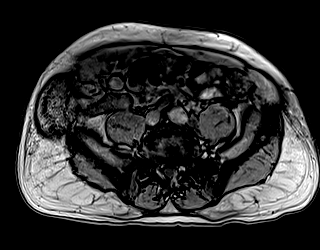
[im 88/88]
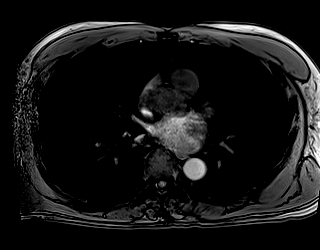

[Series 8: T1 · axial · 3.0mm · 1.17mm/px · z∈[-136,+125]mm · 3 of 88 slices shown (2 of 2)]
[im 1/88]
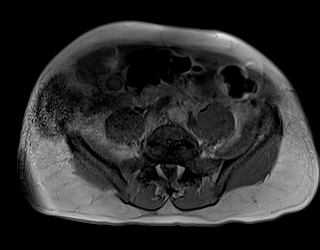
[im 44/88]
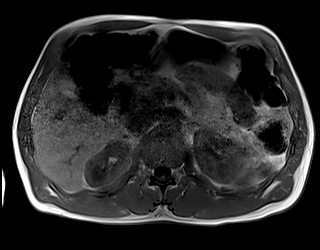
[im 88/88]
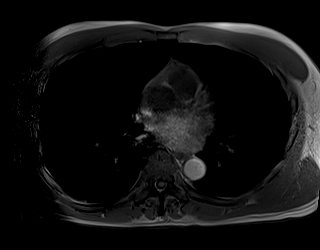

[Series 9: T2 · axial · 6.0mm · 1.46mm/px · 1 of 38 slices shown (1 of 2)]
[im 1/38]
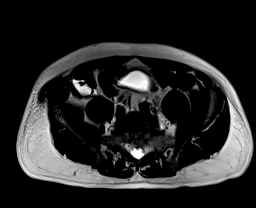

[Series 11: T2 · coronal · 6.0mm · 1.46mm/px · 1 of 32 slices shown (2 of 2)]
[im 1/32]
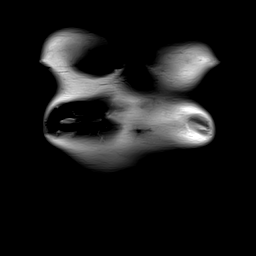

[Series 12: cor obl thk · sagittal · 50.0mm · 0.78mm/px · 1 of 9 slices shown]
[im 1/9]
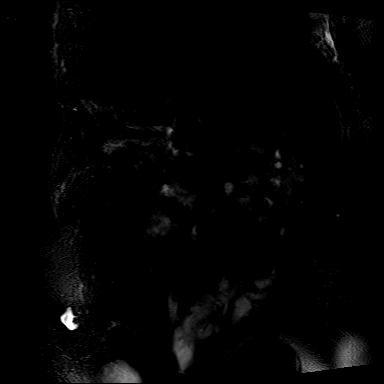

[Series 14: cor_3d_spc_trig · coronal · 1.0mm · 0.49mm/px · 3 of 88 slices shown]
[im 1/88]
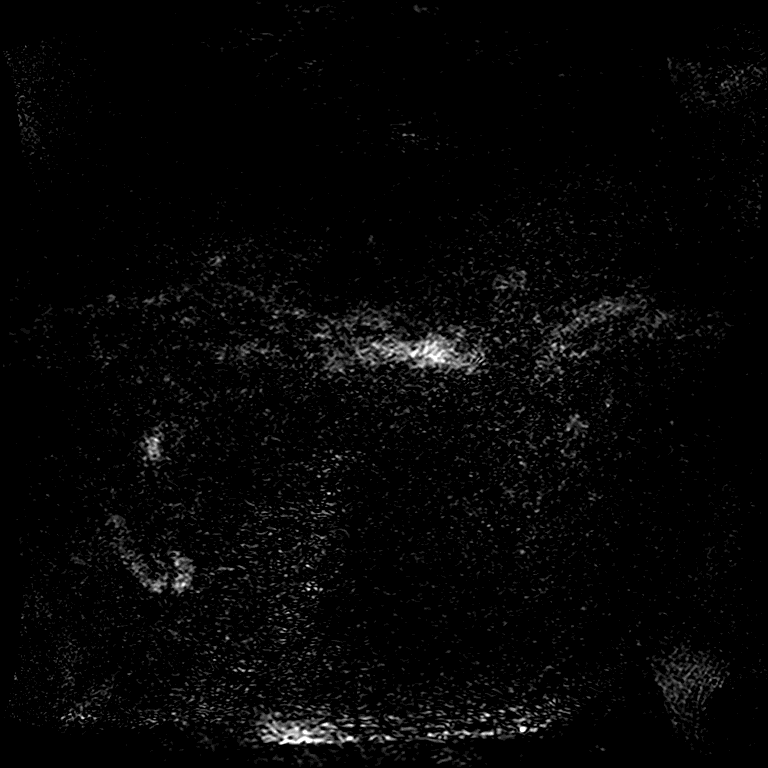
[im 44/88]
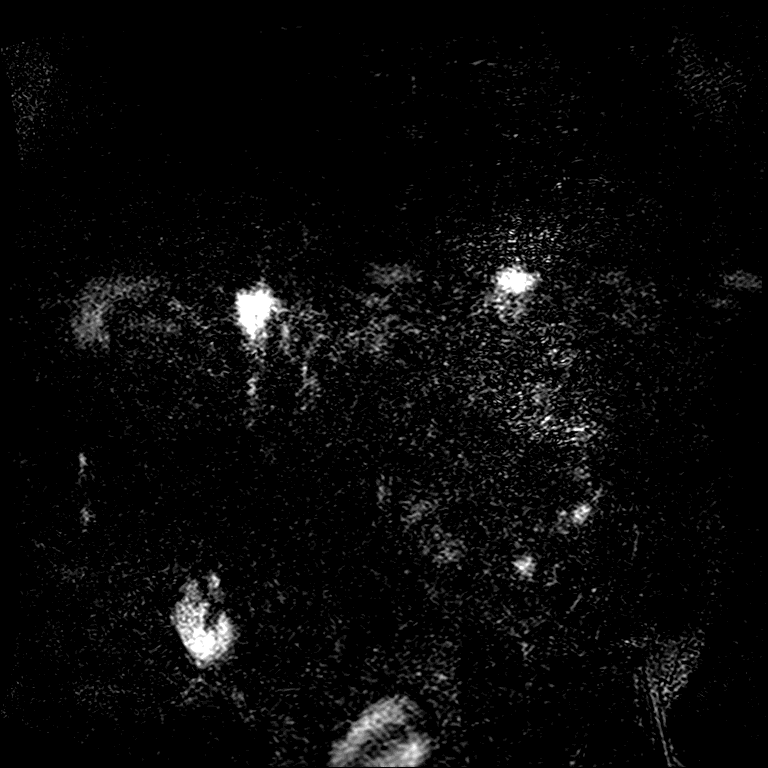
[im 88/88]
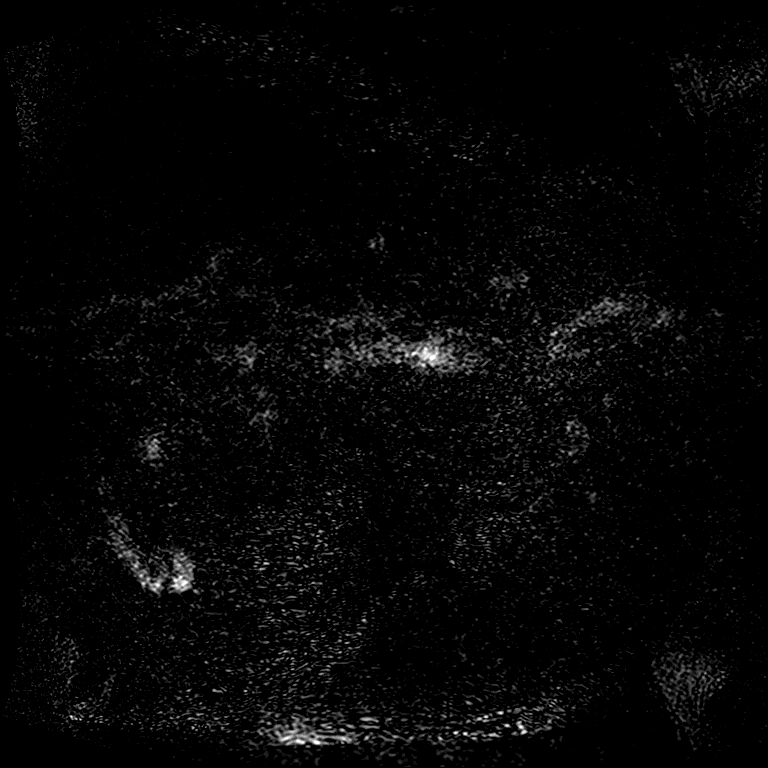

[Series 17: T1 dynamic · axial · 3.0mm · 1.17mm/px · z∈[-146,+115]mm · 3 of 88 slices shown (1 of 6)]
[im 1/88]
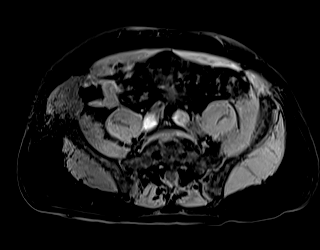
[im 44/88]
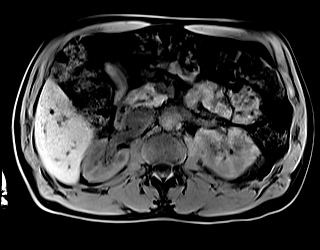
[im 88/88]
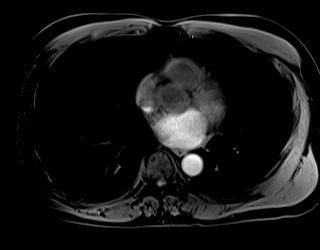

[Series 18: DWI · axial · 6.0mm · 1.40mm/px · z∈[-120,+154]mm · 3 of 78 slices shown (3 of 4)]
[im 1/78]
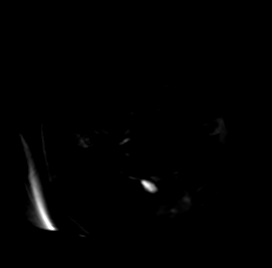
[im 39/78]
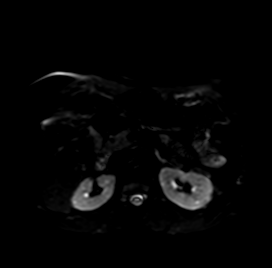
[im 78/78]
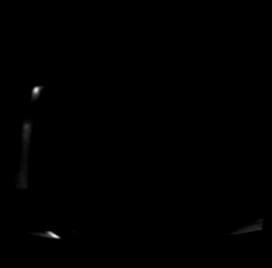

[Series 19: DWI · axial · 6.0mm · 1.40mm/px · 1 of 39 slices shown (4 of 4)]
[im 1/39]
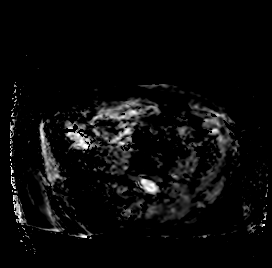

[Series 22: T1 dynamic · axial · 3.0mm · 1.17mm/px · z∈[-146,+115]mm · 3 of 88 slices shown (2 of 6)]
[im 1/88]
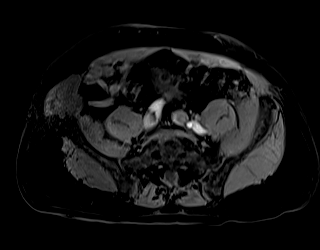
[im 44/88]
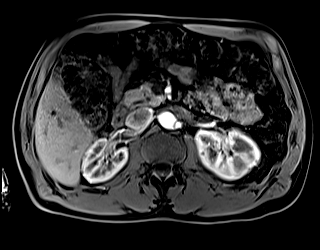
[im 88/88]
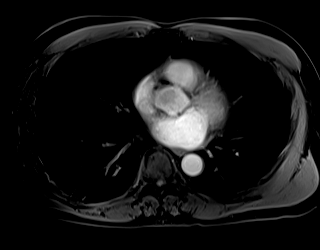

[Series 24: T1 dynamic · axial · 3.0mm · 1.17mm/px · z∈[-146,+115]mm · 3 of 88 slices shown (3 of 6)]
[im 1/88]
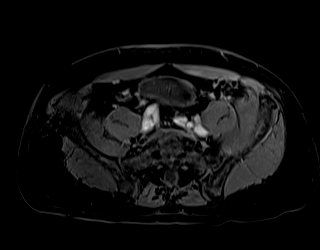
[im 44/88]
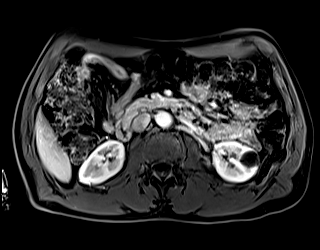
[im 88/88]
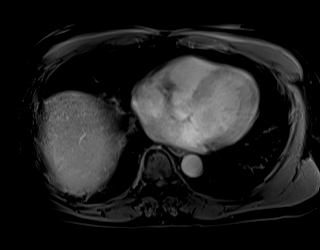

[Series 26: T1 dynamic · axial · 3.0mm · 1.17mm/px · z∈[-146,+115]mm · 3 of 88 slices shown (4 of 6)]
[im 1/88]
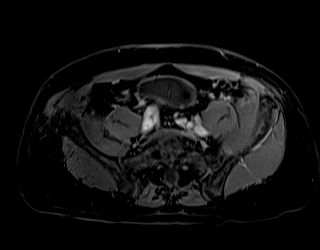
[im 44/88]
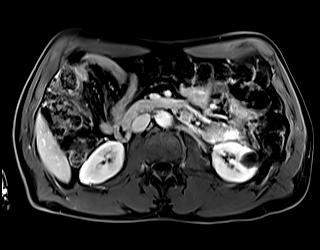
[im 88/88]
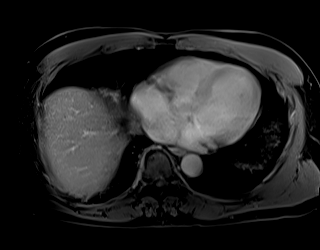

[Series 28: T1 dynamic · coronal · 3.0mm · 1.17mm/px · 3 of 80 slices shown (5 of 6)]
[im 1/80]
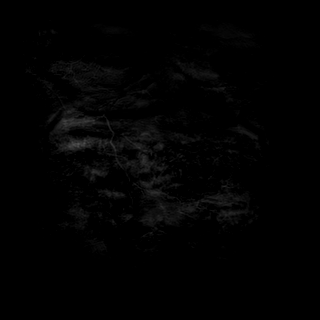
[im 40/80]
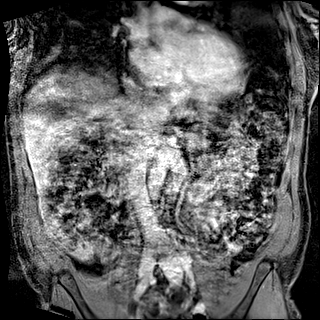
[im 80/80]
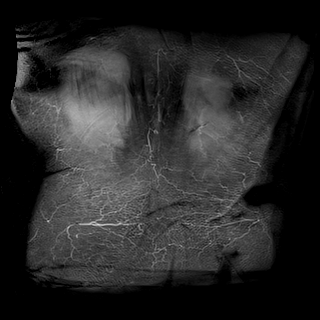

[Series 30: T1 dynamic · axial · 3.0mm · 1.17mm/px · z∈[-146,+115]mm · 3 of 88 slices shown (6 of 6)]
[im 1/88]
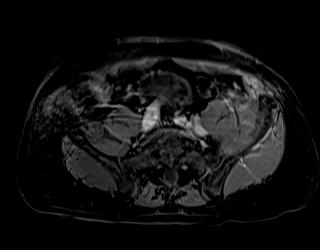
[im 44/88]
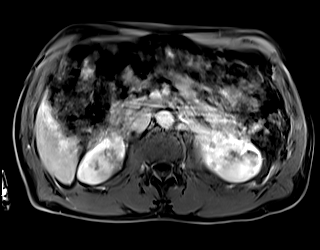
[im 88/88]
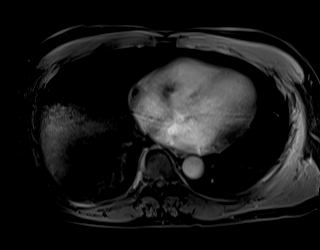

[Series 100: 20 sec sub · axial · 3.0mm · 1.17mm/px · z∈[-146,+115]mm · 3 of 88 slices shown]
[im 1/88]
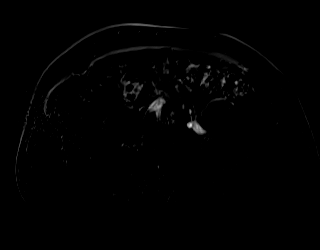
[im 44/88]
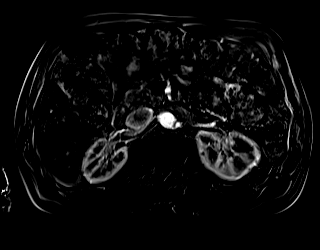
[im 88/88]
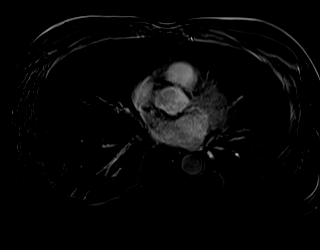

[40 of 48 positions shown; findings below may reference images not displayed]

FINDINGS: Lower chest: No acute findings.

Hepatobiliary: No hepatic masses identified. Gallbladder is
unremarkable. No evidence of biliary ductal dilatation.

Pancreas: Mild pancreatic ductal dilatation is unchanged, measuring
approximately 4 mm. No evidence of pancreas divisum. No evidence of
pancreatic mass or inflammatory changes.

Spleen: No evidence of splenomegaly. A complex cystic lesion
containing numerous thin internal septations is seen measuring 2.8 x
2.3 cm on image 17/26. A similar adjacent 1.0 cm lesion is also
seen. Both of these are stable since previous study and most likely
represent benign lesions such as lymphangiomas.

Adrenals/Urinary Tract: Multiple small benign-appearing cysts are
again seen in both kidneys. No masses identified. No evidence of
hydronephrosis.

Stomach/Bowel: Visualized portion unremarkable.

Vascular/Lymphatic: No pathologically enlarged lymph nodes
identified. No abdominal aortic aneurysm.

Other:  None.

Musculoskeletal:  No suspicious bone lesions identified.
IMPRESSION: Stable mild pancreatic ductal dilatation, without evidence of
pancreatic mass or other significant abnormality.

Stable complex cystic lesions in the spleen, likely representing
benign lesions such as lymphangiomas. Recommend continued follow-up
by MRI in 6 months. This recommendation follows ACR consensus
guidelines: White Paper of the ACR Incidental Findings Committee II
on Splenic and Nodal Findings. [HOSPITAL] 1731;[DATE].

## 2021-06-17 ENCOUNTER — Other Ambulatory Visit: Payer: Self-pay | Admitting: Family Medicine

## 2021-06-17 DIAGNOSIS — I10 Essential (primary) hypertension: Secondary | ICD-10-CM

## 2021-06-17 DIAGNOSIS — R300 Dysuria: Secondary | ICD-10-CM

## 2021-07-07 ENCOUNTER — Other Ambulatory Visit: Payer: Self-pay | Admitting: Family Medicine

## 2021-07-07 DIAGNOSIS — I10 Essential (primary) hypertension: Secondary | ICD-10-CM

## 2021-07-08 ENCOUNTER — Other Ambulatory Visit: Payer: Self-pay | Admitting: Family Medicine

## 2021-07-08 DIAGNOSIS — R300 Dysuria: Secondary | ICD-10-CM

## 2021-07-08 DIAGNOSIS — I10 Essential (primary) hypertension: Secondary | ICD-10-CM

## 2021-07-30 ENCOUNTER — Other Ambulatory Visit: Payer: Self-pay | Admitting: Family Medicine

## 2021-07-30 DIAGNOSIS — I10 Essential (primary) hypertension: Secondary | ICD-10-CM

## 2021-08-19 ENCOUNTER — Telehealth: Payer: Self-pay

## 2021-08-19 NOTE — Telephone Encounter (Signed)
Called pt to get scheduled for an appointment as has not been seen since 05/31/20 lm to call back

## 2021-09-04 ENCOUNTER — Ambulatory Visit (INDEPENDENT_AMBULATORY_CARE_PROVIDER_SITE_OTHER)
Admission: RE | Admit: 2021-09-04 | Discharge: 2021-09-04 | Disposition: A | Discharge status: Home / Self Care | Payer: Medicare HMO | Source: Ambulatory Visit | Attending: Family Medicine | Admitting: Family Medicine

## 2021-09-04 ENCOUNTER — Encounter: Payer: Self-pay | Admitting: Family Medicine

## 2021-09-04 ENCOUNTER — Ambulatory Visit (INDEPENDENT_AMBULATORY_CARE_PROVIDER_SITE_OTHER): Payer: Medicare HMO | Admitting: Family Medicine

## 2021-09-04 DIAGNOSIS — M25562 Pain in left knee: Secondary | ICD-10-CM

## 2021-09-04 DIAGNOSIS — H539 Unspecified visual disturbance: Secondary | ICD-10-CM | POA: Diagnosis not present

## 2021-09-04 DIAGNOSIS — M542 Cervicalgia: Secondary | ICD-10-CM

## 2021-09-04 DIAGNOSIS — M25462 Effusion, left knee: Secondary | ICD-10-CM | POA: Diagnosis not present

## 2021-09-04 DIAGNOSIS — M62838 Other muscle spasm: Secondary | ICD-10-CM | POA: Diagnosis not present

## 2021-09-04 DIAGNOSIS — M47812 Spondylosis without myelopathy or radiculopathy, cervical region: Secondary | ICD-10-CM | POA: Diagnosis not present

## 2021-09-04 DIAGNOSIS — M1712 Unilateral primary osteoarthritis, left knee: Secondary | ICD-10-CM | POA: Diagnosis not present

## 2021-09-04 DIAGNOSIS — M7989 Other specified soft tissue disorders: Secondary | ICD-10-CM | POA: Diagnosis not present

## 2021-09-04 MED ORDER — TIZANIDINE HCL 4 MG PO TABS: 4.0000 mg | ORAL_TABLET | Freq: Four times a day (QID) | ORAL | 0 refills | Status: DC | PRN
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
## 2021-09-08 ENCOUNTER — Ambulatory Visit: Payer: Medicare HMO | Admitting: Orthopedic Surgery

## 2021-09-13 ENCOUNTER — Other Ambulatory Visit: Payer: Self-pay | Admitting: Family Medicine

## 2021-09-13 DIAGNOSIS — M109 Gout, unspecified: Secondary | ICD-10-CM

## 2021-09-19 ENCOUNTER — Telehealth: Payer: Self-pay | Admitting: Family Medicine

## 2021-09-19 DIAGNOSIS — H524 Presbyopia: Secondary | ICD-10-CM | POA: Diagnosis not present

## 2021-09-19 NOTE — Telephone Encounter (Signed)
Left message for patient to call back and schedule Medicare Annual Wellness Visit (AWV).   Please offer to do virtually or by telephone.  Left office number and my jabber [phone].  Last AWV:05/31/2020  Please schedule at anytime with Nurse Health Advisor.

## 2021-09-22 ENCOUNTER — Ambulatory Visit (INDEPENDENT_AMBULATORY_CARE_PROVIDER_SITE_OTHER): Payer: Medicare HMO

## 2021-09-22 ENCOUNTER — Ambulatory Visit: Payer: Medicare HMO | Admitting: Surgical

## 2021-09-22 DIAGNOSIS — M25462 Effusion, left knee: Secondary | ICD-10-CM

## 2021-09-23 ENCOUNTER — Encounter: Payer: Self-pay | Admitting: Orthopedic Surgery

## 2021-09-23 MED ORDER — BUPIVACAINE HCL 0.25 % IJ SOLN
4.0000 mL | INTRAMUSCULAR | Status: AC | PRN
  Administered 2021-09-22: 4 mL via INTRA_ARTICULAR

## 2021-09-23 MED ORDER — LIDOCAINE HCL 1 % IJ SOLN
5.0000 mL | INTRAMUSCULAR | Status: AC | PRN
  Administered 2021-09-22: 5 mL

## 2021-09-23 MED ORDER — METHYLPREDNISOLONE ACETATE 40 MG/ML IJ SUSP
40.0000 mg | INTRAMUSCULAR | Status: AC | PRN
  Administered 2021-09-22: 40 mg via INTRA_ARTICULAR
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
## 2021-10-14 ENCOUNTER — Other Ambulatory Visit: Payer: Self-pay | Admitting: Family Medicine

## 2021-10-14 DIAGNOSIS — I10 Essential (primary) hypertension: Secondary | ICD-10-CM

## 2021-10-24 ENCOUNTER — Other Ambulatory Visit: Payer: Self-pay | Admitting: Family Medicine

## 2021-10-24 DIAGNOSIS — I10 Essential (primary) hypertension: Secondary | ICD-10-CM

## 2021-11-25 ENCOUNTER — Telehealth: Payer: Self-pay | Admitting: Family Medicine

## 2021-11-25 NOTE — Telephone Encounter (Signed)
Left message for patient to call back and schedule Medicare Annual Wellness Visit (AWV).   Please offer to do virtually or by telephone.  Left office number and my jabber [phone].  Last AWV: 05/31/2020  Please schedule at anytime with Nurse Health Advisor.

## 2021-12-04 DIAGNOSIS — H524 Presbyopia: Secondary | ICD-10-CM | POA: Diagnosis not present

## 2021-12-04 DIAGNOSIS — H52223 Regular astigmatism, bilateral: Secondary | ICD-10-CM | POA: Diagnosis not present

## 2021-12-08 ENCOUNTER — Ambulatory Visit: Payer: Medicare HMO | Admitting: Family

## 2021-12-08 ENCOUNTER — Ambulatory Visit (INDEPENDENT_AMBULATORY_CARE_PROVIDER_SITE_OTHER): Payer: Medicare HMO | Admitting: Family Medicine

## 2021-12-08 ENCOUNTER — Encounter: Payer: Self-pay | Admitting: Family Medicine

## 2021-12-08 VITALS — BP 132/60 | HR 105 | Temp 98.5°F | Ht 70.0 in | Wt 191.8 lb

## 2021-12-08 DIAGNOSIS — R350 Frequency of micturition: Secondary | ICD-10-CM | POA: Diagnosis not present

## 2021-12-08 DIAGNOSIS — N419 Inflammatory disease of prostate, unspecified: Secondary | ICD-10-CM | POA: Diagnosis not present

## 2021-12-08 DIAGNOSIS — Z23 Encounter for immunization: Secondary | ICD-10-CM

## 2021-12-08 LAB — POCT URINALYSIS DIPSTICK
Bilirubin, UA: NEGATIVE
Blood, UA: NEGATIVE
Glucose, UA: NEGATIVE
Ketones, UA: NEGATIVE
Nitrite, UA: NEGATIVE
Protein, UA: NEGATIVE
Spec Grav, UA: 1.025 (ref 1.010–1.025)
Urobilinogen, UA: 0.2 E.U./dL
pH, UA: 7.5 (ref 5.0–8.0)

## 2021-12-08 MED ORDER — SULFAMETHOXAZOLE-TRIMETHOPRIM 800-160 MG PO TABS: 1.0000 | ORAL_TABLET | Freq: Two times a day (BID) | ORAL | 0 refills | Status: DC

## 2021-12-08 NOTE — Progress Notes (Unsigned)
Subjective:  Patient ID: Matthew Simon, male    DOB: November 07, 1950  Age: 71 y.o. MRN: [id number]  CC:  Chief Complaint  Patient presents with   Urinary Frequency    Urinary frequency with pain that started Friday     HPI Linkon P Wynns presents for   Urinary frequency: Started 3 days ago. Urgency, frequency. No dysuria, slight decreased flow, no retention. Some difficulty initiating, no fever, abd pain or back pain. Slight better today.  Nocturia 2 times per night.  Hx of BPH, followed by urology in 2021, PSA 2.06.  on doxazosin. Tried double dose for 1 day - min change.  Took leftover keflex - 2 pills last night and this am. Feeling a little better today.   Immunization History  Administered Date(s) Administered   Fluad Quad(high Dose 65+) 11/20/2019   Influenza, Seasonal, Injecte, Preservative Fre 02/12/2012   Influenza,inj,Quad PF,6+ Mos 12/24/2014   PFIZER Comirnaty(Gray Top)Covid-19 Tri-Sucrose Vaccine 03/01/2019, 03/22/2019   Pneumococcal Conjugate-13 02/21/2015   Pneumococcal Polysaccharide-23 02/26/2010, 05/05/2016, 08/12/2019   Zoster Recombinat (Shingrix) 05/31/2020     History Patient Active Problem List   Diagnosis Date Noted   Rupture of left quadriceps tendon 08/09/2019   Soft tissue mass 05/22/2017   Gout 05/05/2016   Hypertension 04/10/2011   Colon polyp 04/10/2011   Past Medical History:  Diagnosis Date   Gout    Hypertension    Past Surgical History:  Procedure Laterality Date   ARTHROSCOPIC REPAIR ACL Left    apx July    COLONOSCOPY     EXCISION MASS NECK N/A 05/24/2017   Procedure: EXCISION POSTERIOR NECK MASS;  Surgeon: Armandina Gemma, MD;  Location: [facility];  Service: General;  Laterality: N/A;   QUADRICEPS TENDON REPAIR Left 08/10/2019   Procedure: REPAIR QUADRICEP TENDON RUPTURE;  Surgeon: Meredith Pel, MD;  Location: [facility];  Service: Orthopedics;  Laterality: Left;   WISDOM TOOTH EXTRACTION     No Known Allergies Prior to Admission  medications   Medication Sig Start Date End Date Taking? Authorizing Provider  allopurinol (ZYLOPRIM) 100 MG tablet TAKE 1 TABLET BY MOUTH EVERY DAY 09/15/21  Yes Wendie Agreste, MD  amLODipine (NORVASC) 5 MG tablet TAKE 1 TABLET (5 MG TOTAL) BY MOUTH DAILY. 10/24/21  Yes Wendie Agreste, MD  doxazosin (CARDURA) 2 MG tablet TAKE 1 TABLET BY MOUTH EVERY DAY 07/08/21  Yes Wendie Agreste, MD  lisinopril (ZESTRIL) 20 MG tablet TAKE 1 TABLET BY MOUTH EVERY DAY 07/30/21  Yes Wendie Agreste, MD  tiZANidine (ZANAFLEX) 4 MG tablet Take 1 tablet (4 mg total) by mouth every 6 (six) hours as needed for muscle spasms. Patient not taking: Reported on 12/08/2021 09/04/21   Wendie Agreste, MD   Social History   Socioeconomic History   Marital status: Married    Spouse name: Not on file   Number of children: Not on file   Years of education: Not on file   Highest education level: Not on file  Occupational History   Occupation: business development  Tobacco Use   Smoking status: Former    Types: Cigarettes    Quit date: 05/10/1964    Years since quitting: 57.6   Smokeless tobacco: Never  Vaping Use   Vaping Use: Never used  Substance and Sexual Activity   Alcohol use: Yes    Alcohol/week: 20.0 standard drinks of alcohol    Types: 10 Glasses of wine, 10 Standard drinks or equivalent per week  Comment: wine with dinner   Drug use: No   Sexual activity: Not Currently  Other Topics Concern   Not on file  Social History Narrative   Not on file   Social Determinants of Health   Financial Resource Strain: Not on file  Food Insecurity: Not on file  Transportation Needs: Not on file  Physical Activity: Not on file  Stress: Not on file  Social Connections: Not on file  Intimate Partner Violence: Not on file    Review of Systems   Objective:   Vitals:   12/08/21 1216  BP: 132/60  Pulse: (!) 105  Temp: 98.5 F (36.9 C)  SpO2: 98%  Weight: 191 lb 12.8 oz (87 kg)  Height: '5\' 10"'$   (1.778 m)     Physical Exam Vitals reviewed.  Constitutional:      Appearance: He is well-developed.  HENT:     Head: Normocephalic and atraumatic.  Neck:     Vascular: No carotid bruit or JVD.  Cardiovascular:     Rate and Rhythm: Normal rate and regular rhythm.     Heart sounds: Normal heart sounds. No murmur heard. Pulmonary:     Effort: Pulmonary effort is normal.     Breath sounds: Normal breath sounds. No rales.  Abdominal:     General: There is no distension.     Tenderness: There is no abdominal tenderness. There is no right CVA tenderness, left CVA tenderness or guarding.  Musculoskeletal:     Right lower leg: No edema.     Left lower leg: No edema.  Skin:    General: Skin is warm and dry.  Neurological:     Mental Status: He is alert and oriented to person, place, and time.  Psychiatric:        Mood and Affect: Mood normal.      Results for orders placed or performed in visit on 12/08/21  POCT Urinalysis Dipstick  Result Value Ref Range   Color, UA yellow    Clarity, UA     Glucose, UA Negative Negative   Bilirubin, UA negative    Ketones, UA negative    Spec Grav, UA 1.025 1.010 - 1.025   Blood, UA Negative    pH, UA 7.5 5.0 - 8.0   Protein, UA Negative Negative   Urobilinogen, UA 0.2 0.2 or 1.0 E.U./dL   Nitrite, UA negative    Leukocytes, UA Trace (A) Negative   Appearance     Odor       Assessment & Plan:  TRAVON Simon is a 71 y.o. male . Prostatitis, unspecified prostatitis type - Plan: Urine Culture, PSA, sulfamethoxazole-trimethoprim (BACTRIM DS) 800-160 MG tablet  Need for shingles vaccine - Plan: CANCELED: Varicella-zoster vaccine subcutaneous  Frequent urination - Plan: POCT Urinalysis Dipstick, Urine Culture, PSA, sulfamethoxazole-trimethoprim (BACTRIM DS) 800-160 MG tablet  Suspected prostatitis with underlying BPH.  Check PSA, urine culture.  No concerning symptoms in office, afebrile, no signs of retention currently but ER  precautions were given if that were to occur.  Start Septra, potential side effects of meds discussed.  Initial 10-day course as some improvement, but may need to extend to longer course depending on symptom resolution and to watch for recurrence.  RTC precautions given.  Meds ordered this encounter  Medications   sulfamethoxazole-trimethoprim (BACTRIM DS) 800-160 MG tablet    Sig: Take 1 tablet by mouth 2 (two) times daily.    Dispense:  20 tablet    Refill:  0  Patient Instructions  Start antibiotic for possible prostate infection.  Return to the clinic or go to the nearest emergency room if any of your symptoms worsen or new symptoms occur. Prostatitis  Prostatitis is swelling or inflammation of the prostate gland, also called the prostate. This gland is about 1.5 inches wide and 1 inch high, and it is involved in making semen. The prostate is located below a man's bladder, in front of the rectum. There are four types of prostatitis: Chronic prostatitis (CP), also called chronic pelvic pain syndrome (CPPS). This is the most common type of prostatitis. It is associated with increased muscle tone in the area between the hip bones (pelvic area), around the prostate. This type is also known as a pelvic floor disorder. Chronic bacterial prostatitis. This type usually results from an acute bacterial infection in the prostate gland that keeps coming back or has not been treated properly. The symptoms are less severe than those caused by acute bacterial prostatitis, which lasts a shorter time. Asymptomatic inflammatory prostatitis. This type does not have symptoms and does not need treatment. This is diagnosed when tests are done for other disorders of the urinary tract or reproductive tract. Acute bacterial prostatitis. This type starts quickly and results from an acute bacterial infection in the prostate gland. It is usually associated with a bladder infection, high fever, and chills. This is the  least common type of prostatitis. What are the causes? Bacterial prostatitis is caused by an infection from bacteria. Chronic nonbacterial prostatitis may be caused by: Factors related to the nervous system. This system includes thebrain, spinal cord, and nerves. An autoimmune response. This happens when the body's disease-fighting system attacks healthy tissue in the body by mistake. Psychological factors. These have to do with how the mind works. The causes of the other types of prostatitis are usually not known. What are the signs or symptoms? Symptoms of this condition depend on the type of prostatitis you have. Acute bacterial prostatitis Symptoms may include: Pain or burning during urination. Frequent and sudden urges to urinate. Trouble starting to urinate. Fever. Chills. Pain in your muscles or joints, lower back, or lower abdomen. Other types of prostatitis Symptoms may include: Sudden urges to urinate, or urinating often. Trouble starting to urinate. Weak urine stream. Dribbling after urination. Discharge coming from the penis. Pain in the testicles, the penis, or the tip of the penis. Pain in the area in front of the rectum and below the scrotum (perineum). Pain when ejaculating. How is this diagnosed? This condition may be diagnosed based on: A physical and medical exam. A digital rectal exam. For this, the health care provider may use a finger to feel the prostate. A urine test to check for bacteria. A semen sample or blood tests. Ultrasound. Urodynamic tests to check how your body handles urine. Cystoscopy to look inside your bladder or inside the part of your body that drains urine from the bladder (urethra). How is this treated? Treatment for this condition depends on the type of prostatitis. Treatment may involve: Medicines to relieve pain or inflammation, or to help relax your muscles. Physical therapy. Heat therapy. Biofeedback. These techniques help you  control certain body functions. Relaxation exercises. Antibiotic medicine, if your condition is caused by bacteria. Sitz baths. These warm water baths help to relax your pelvic floor muscles, which helps to relieve pressure on the prostate. Follow these instructions at home: Medicines Take over-the-counter and prescription medicines only as told by your health care provider. If  you were prescribed an antibiotic medicine, take it as told by your health care provider. Do not stop using the antibiotic even if you start to feel better. Managing pain and swelling  Take sitz baths as directed by your health care provider. For a sitz bath, sit in warm water that is deep enough to cover your hips and buttocks. If directed, apply heat to the affected area as often as told by your health care provider. Use the heat source that your health care provider recommends, such as a moist heat pack or a heating pad. Place a towel between your skin and the heat source. Leave the heat on for 20-30 minutes. Remove the heat if your skin turns bright red. This is especially important if you are unable to feel pain, heat, or cold. You may have a greater risk of getting burned. General instructions Do exercises as told by your health care provider, if you were prescribed physical therapy, biofeedback, or relaxation exercises. Keep all follow-up visits as told by your health care provider. This is important. Where to find more information [employer] of Diabetes and Digestive and Kidney Diseases: http://www.bass.com/ Contact a health care provider if: Your symptoms get worse. You have a fever. Get help right away if: You have chills. You feel light-headed or feel like you may faint. You cannot urinate. You have blood or blood clots in your urine. Summary Prostatitis is swelling or inflammation of the prostate gland. Treatment for this condition depends on the type of prostatitis. Take  over-the-counter and prescription medicines only as told by your health care provider. Get help right away of you have chills, feel light-headed, feel like you may faint, cannot urinate, or have blood or blood clots in your urine. This information is not intended to replace advice given to you by your health care provider. Make sure you discuss any questions you have with your health care provider. Document Revised: 02/24/2019 Document Reviewed: 02/24/2019 Elsevier Patient Education  2023 [place],   Merri Ray, MD [place], [facility] 12/08/21 1:08 PM

## 2021-12-08 NOTE — Patient Instructions (Signed)
Start antibiotic for possible prostate infection.  Return to the clinic or go to the nearest emergency room if any of your symptoms worsen or new symptoms occur. Prostatitis  Prostatitis is swelling or inflammation of the prostate gland, also called the prostate. This gland is about 1.5 inches wide and 1 inch high, and it is involved in making semen. The prostate is located below a man's bladder, in front of the rectum. There are four types of prostatitis: Chronic prostatitis (CP), also called chronic pelvic pain syndrome (CPPS). This is the most common type of prostatitis. It is associated with increased muscle tone in the area between the hip bones (pelvic area), around the prostate. This type is also known as a pelvic floor disorder. Chronic bacterial prostatitis. This type usually results from an acute bacterial infection in the prostate gland that keeps coming back or has not been treated properly. The symptoms are less severe than those caused by acute bacterial prostatitis, which lasts a shorter time. Asymptomatic inflammatory prostatitis. This type does not have symptoms and does not need treatment. This is diagnosed when tests are done for other disorders of the urinary tract or reproductive tract. Acute bacterial prostatitis. This type starts quickly and results from an acute bacterial infection in the prostate gland. It is usually associated with a bladder infection, high fever, and chills. This is the least common type of prostatitis. What are the causes? Bacterial prostatitis is caused by an infection from bacteria. Chronic nonbacterial prostatitis may be caused by: Factors related to the nervous system. This system includes thebrain, spinal cord, and nerves. An autoimmune response. This happens when the body's disease-fighting system attacks healthy tissue in the body by mistake. Psychological factors. These have to do with how the mind works. The causes of the other types of  prostatitis are usually not known. What are the signs or symptoms? Symptoms of this condition depend on the type of prostatitis you have. Acute bacterial prostatitis Symptoms may include: Pain or burning during urination. Frequent and sudden urges to urinate. Trouble starting to urinate. Fever. Chills. Pain in your muscles or joints, lower back, or lower abdomen. Other types of prostatitis Symptoms may include: Sudden urges to urinate, or urinating often. Trouble starting to urinate. Weak urine stream. Dribbling after urination. Discharge coming from the penis. Pain in the testicles, the penis, or the tip of the penis. Pain in the area in front of the rectum and below the scrotum (perineum). Pain when ejaculating. How is this diagnosed? This condition may be diagnosed based on: A physical and medical exam. A digital rectal exam. For this, the health care provider may use a finger to feel the prostate. A urine test to check for bacteria. A semen sample or blood tests. Ultrasound. Urodynamic tests to check how your body handles urine. Cystoscopy to look inside your bladder or inside the part of your body that drains urine from the bladder (urethra). How is this treated? Treatment for this condition depends on the type of prostatitis. Treatment may involve: Medicines to relieve pain or inflammation, or to help relax your muscles. Physical therapy. Heat therapy. Biofeedback. These techniques help you control certain body functions. Relaxation exercises. Antibiotic medicine, if your condition is caused by bacteria. Sitz baths. These warm water baths help to relax your pelvic floor muscles, which helps to relieve pressure on the prostate. Follow these instructions at home: Medicines Take over-the-counter and prescription medicines only as told by your health care provider. If you were prescribed an  antibiotic medicine, take it as told by your health care provider. Do not stop  using the antibiotic even if you start to feel better. Managing pain and swelling  Take sitz baths as directed by your health care provider. For a sitz bath, sit in warm water that is deep enough to cover your hips and buttocks. If directed, apply heat to the affected area as often as told by your health care provider. Use the heat source that your health care provider recommends, such as a moist heat pack or a heating pad. Place a towel between your skin and the heat source. Leave the heat on for 20-30 minutes. Remove the heat if your skin turns bright red. This is especially important if you are unable to feel pain, heat, or cold. You may have a greater risk of getting burned. General instructions Do exercises as told by your health care provider, if you were prescribed physical therapy, biofeedback, or relaxation exercises. Keep all follow-up visits as told by your health care provider. This is important. Where to find more information [employer] of Diabetes and Digestive and Kidney Diseases: http://www.bass.com/ Contact a health care provider if: Your symptoms get worse. You have a fever. Get help right away if: You have chills. You feel light-headed or feel like you may faint. You cannot urinate. You have blood or blood clots in your urine. Summary Prostatitis is swelling or inflammation of the prostate gland. Treatment for this condition depends on the type of prostatitis. Take over-the-counter and prescription medicines only as told by your health care provider. Get help right away of you have chills, feel light-headed, feel like you may faint, cannot urinate, or have blood or blood clots in your urine. This information is not intended to replace advice given to you by your health care provider. Make sure you discuss any questions you have with your health care provider. Document Revised: 02/24/2019 Document Reviewed: 02/24/2019 Elsevier Patient Education  [place].

## 2021-12-10 LAB — URINE CULTURE
MICRO NUMBER:: 14148931
Result:: NO GROWTH
SPECIMEN QUALITY:: ADEQUATE

## 2021-12-18 ENCOUNTER — Ambulatory Visit (INDEPENDENT_AMBULATORY_CARE_PROVIDER_SITE_OTHER): Payer: Medicare HMO | Admitting: *Deleted

## 2021-12-18 ENCOUNTER — Telehealth: Payer: Self-pay | Admitting: *Deleted

## 2021-12-18 DIAGNOSIS — Z0000 Encounter for general adult medical examination without abnormal findings: Secondary | ICD-10-CM

## 2021-12-18 DIAGNOSIS — N419 Inflammatory disease of prostate, unspecified: Secondary | ICD-10-CM

## 2021-12-18 DIAGNOSIS — R350 Frequency of micturition: Secondary | ICD-10-CM

## 2021-12-18 NOTE — Telephone Encounter (Signed)
[name] was seen on 86- he is still having symptoms and has finished his  sulfamethoxazole-trimethoprim (BACTRIM DS) 800-160 MG tablet. He wanted another round, I did advise him he may have to come back in for an appointment. But wanted me to send a message first. He does not use my chart would rather a phone call.

## 2021-12-18 NOTE — Telephone Encounter (Signed)
Will refill once, as sometimes prostate infections do require a more prolonged course of antibiotics.  If he requires further treatment after this test, I do recommend he return for visit to discuss but I do not see that his PSA test was run.  Any chance he can have that done as a lab visit here or at [facility]?  New order placed.

## 2021-12-18 NOTE — Patient Instructions (Signed)
Matthew Simon , Thank you for taking time to come for your Medicare Wellness Visit. I appreciate your ongoing commitment to your health goals. Please review the following plan we discussed and let me know if I can assist you in the future.   These are the goals we discussed:  Goals   None     This is a list of the screening recommended for you and due dates:  Health Maintenance  Topic Date Due   Yearly kidney health urinalysis for diabetes  Never done   Zoster (Shingles) Vaccine (2 of 2) 07/26/2020   Hemoglobin A1C  11/30/2020   Yearly kidney function blood test for diabetes  05/31/2021   Complete foot exam   05/31/2021   COVID-19 Vaccine (4 - Pfizer series) 12/24/2021*   Flu Shot  05/03/2022*   Colon Cancer Screening  09/05/2022*   Tetanus Vaccine  09/05/2022*   Eye exam for diabetics  10/30/2022   Medicare Annual Wellness Visit  12/19/2022   Pneumonia Vaccine  Completed   Hepatitis C Screening: USPSTF Recommendation to screen - Ages 18-79 yo.  Completed   HPV Vaccine  Aged Out  *Topic was postponed. The date shown is not the original due date.    Advanced directives: education provided  Conditions/risks identified:    Preventive Care 13 Years and Older, Male  Preventive care refers to lifestyle choices and visits with your health care provider that can promote health and wellness. What does preventive care include? A yearly physical exam. This is also called an annual well check. Dental exams once or twice a year. Routine eye exams. Ask your health care provider how often you should have your eyes checked. Personal lifestyle choices, including: Daily care of your teeth and gums. Regular physical activity. Eating a healthy diet. Avoiding tobacco and drug use. Limiting alcohol use. Practicing safe sex. Taking low doses of aspirin every day. Taking vitamin and mineral supplements as recommended by your health care provider. What happens during an annual well check? The  services and screenings done by your health care provider during your annual well check will depend on your age, overall health, lifestyle risk factors, and family history of disease. Counseling  Your health care provider may ask you questions about your: Alcohol use. Tobacco use. Drug use. Emotional well-being. Home and relationship well-being. Sexual activity. Eating habits. History of falls. Memory and ability to understand (cognition). Work and work Statistician. Screening  You may have the following tests or measurements: Height, weight, and BMI. Blood pressure. Lipid and cholesterol levels. These may be checked every 5 years, or more frequently if you are over 24 years old. Skin check. Lung cancer screening. You may have this screening every year starting at age 77 if you have a 30-pack-year history of smoking and currently smoke or have quit within the past 15 years. Fecal occult blood test (FOBT) of the stool. You may have this test every year starting at age 5. Flexible sigmoidoscopy or colonoscopy. You may have a sigmoidoscopy every 5 years or a colonoscopy every 10 years starting at age 74. Prostate cancer screening. Recommendations will vary depending on your family history and other risks. Hepatitis C blood test. Hepatitis B blood test. Sexually transmitted disease (STD) testing. Diabetes screening. This is done by checking your blood sugar (glucose) after you have not eaten for a while (fasting). You may have this done every 1-3 years. Abdominal aortic aneurysm (AAA) screening. You may need this if you are a current or  former smoker. Osteoporosis. You may be screened starting at age 19 if you are at high risk. Talk with your health care provider about your test results, treatment options, and if necessary, the need for more tests. Vaccines  Your health care provider may recommend certain vaccines, such as: Influenza vaccine. This is recommended every year. Tetanus,  diphtheria, and acellular pertussis (Tdap, Td) vaccine. You may need a Td booster every 10 years. Zoster vaccine. You may need this after age 35. Pneumococcal 13-valent conjugate (PCV13) vaccine. One dose is recommended after age 82. Pneumococcal polysaccharide (PPSV23) vaccine. One dose is recommended after age 54. Talk to your health care provider about which screenings and vaccines you need and how often you need them. This information is not intended to replace advice given to you by your health care provider. Make sure you discuss any questions you have with your health care provider. Document Released: 02/15/2015 Document Revised: 10/09/2015 Document Reviewed: 11/20/2014 Elsevier Interactive Patient Education  2017 [place] Prevention in the Home Falls can cause injuries. They can happen to people of all ages. There are many things you can do to make your home safe and to help prevent falls. What can I do on the outside of my home? Regularly fix the edges of walkways and driveways and fix any cracks. Remove anything that might make you trip as you walk through a door, such as a raised step or threshold. Trim any bushes or trees on the path to your home. Use bright outdoor lighting. Clear any walking paths of anything that might make someone trip, such as rocks or tools. Regularly check to see if handrails are loose or broken. Make sure that both sides of any steps have handrails. Any raised decks and porches should have guardrails on the edges. Have any leaves, snow, or ice cleared regularly. Use sand or salt on walking paths during winter. Clean up any spills in your garage right away. This includes oil or grease spills. What can I do in the bathroom? Use night lights. Install grab bars by the toilet and in the tub and shower. Do not use towel bars as grab bars. Use non-skid mats or decals in the tub or shower. If you need to sit down in the shower, use a plastic,  non-slip stool. Keep the floor dry. Clean up any water that spills on the floor as soon as it happens. Remove soap buildup in the tub or shower regularly. Attach bath mats securely with double-sided non-slip rug tape. Do not have throw rugs and other things on the floor that can make you trip. What can I do in the bedroom? Use night lights. Make sure that you have a light by your bed that is easy to reach. Do not use any sheets or blankets that are too big for your bed. They should not hang down onto the floor. Have a firm chair that has side arms. You can use this for support while you get dressed. Do not have throw rugs and other things on the floor that can make you trip. What can I do in the kitchen? Clean up any spills right away. Avoid walking on wet floors. Keep items that you use a lot in easy-to-reach places. If you need to reach something above you, use a strong step stool that has a grab bar. Keep electrical cords out of the way. Do not use floor polish or wax that makes floors slippery. If you must use wax, use  non-skid floor wax. Do not have throw rugs and other things on the floor that can make you trip. What can I do with my stairs? Do not leave any items on the stairs. Make sure that there are handrails on both sides of the stairs and use them. Fix handrails that are broken or loose. Make sure that handrails are as long as the stairways. Check any carpeting to make sure that it is firmly attached to the stairs. Fix any carpet that is loose or worn. Avoid having throw rugs at the top or bottom of the stairs. If you do have throw rugs, attach them to the floor with carpet tape. Make sure that you have a light switch at the top of the stairs and the bottom of the stairs. If you do not have them, ask someone to add them for you. What else can I do to help prevent falls? Wear shoes that: Do not have high heels. Have rubber bottoms. Are comfortable and fit you well. Are closed  at the toe. Do not wear sandals. If you use a stepladder: Make sure that it is fully opened. Do not climb a closed stepladder. Make sure that both sides of the stepladder are locked into place. Ask someone to hold it for you, if possible. Clearly mark and make sure that you can see: Any grab bars or handrails. First and last steps. Where the edge of each step is. Use tools that help you move around (mobility aids) if they are needed. These include: Canes. Walkers. Scooters. Crutches. Turn on the lights when you go into a dark area. Replace any light bulbs as soon as they burn out. Set up your furniture so you have a clear path. Avoid moving your furniture around. If any of your floors are uneven, fix them. If there are any pets around you, be aware of where they are. Review your medicines with your doctor. Some medicines can make you feel dizzy. This can increase your chance of falling. Ask your doctor what other things that you can do to help prevent falls. This information is not intended to replace advice given to you by your health care provider. Make sure you discuss any questions you have with your health care provider. Document Released: 11/15/2008 Document Revised: 06/27/2015 Document Reviewed: 02/23/2014 Elsevier Interactive Patient Education  2017 [employer].

## 2021-12-18 NOTE — Progress Notes (Signed)
Subjective:   Matthew Simon is a 71 y.o. male who presents for Medicare Annual/Subsequent preventive examination.  I connected with  Matthew Simon on 12/18/21 by a telephone enabled telemedicine application and verified that I am speaking with the correct person using two identifiers.   I discussed the limitations of evaluation and management by telemedicine. The patient expressed understanding and agreed to proceed.  Patient location: home  Provider location: Tele-health-home    Review of Systems     Cardiac Risk Factors include: advanced age (>62mn, >>65women);hypertension;male gender;obesity (BMI >30kg/m2)     Objective:    Today's Vitals   There is no height or weight on file to calculate BMI.     05/31/2020   10:07 AM 08/10/2019    3:39 PM 08/10/2019   12:31 PM 05/18/2017    1:38 PM 05/12/2017    9:03 AM  Advanced Directives  Does Patient Have a Medical Advance Directive? No No No No Yes  Type of Advance Directive     Living will  Would patient like information on creating a medical advance directive? Yes (MAU/Ambulatory/Procedural Areas - Information given) No - Patient declined No - Patient declined No - Patient declined     Current Medications (verified) Outpatient Encounter Medications as of 12/18/2021  Medication Sig   allopurinol (ZYLOPRIM) 100 MG tablet TAKE 1 TABLET BY MOUTH EVERY DAY   amLODipine (NORVASC) 5 MG tablet TAKE 1 TABLET (5 MG TOTAL) BY MOUTH DAILY.   doxazosin (CARDURA) 2 MG tablet TAKE 1 TABLET BY MOUTH EVERY DAY   lisinopril (ZESTRIL) 20 MG tablet TAKE 1 TABLET BY MOUTH EVERY DAY   sulfamethoxazole-trimethoprim (BACTRIM DS) 800-160 MG tablet Take 1 tablet by mouth 2 (two) times daily. (Patient not taking: Reported on 12/18/2021)   tiZANidine (ZANAFLEX) 4 MG tablet Take 1 tablet (4 mg total) by mouth every 6 (six) hours as needed for muscle spasms. (Patient not taking: Reported on 12/08/2021)   No facility-administered encounter medications on  file as of 12/18/2021.    Allergies (verified) Patient has no known allergies.   History: Past Medical History:  Diagnosis Date   Gout    Hypertension    Past Surgical History:  Procedure Laterality Date   ARTHROSCOPIC REPAIR ACL Left    apx July    COLONOSCOPY     EXCISION MASS NECK N/A 05/24/2017   Procedure: EXCISION POSTERIOR NECK MASS;  Surgeon: GArmandina Gemma MD;  Location: [facility]  Service: General;  Laterality: N/A;   QUADRICEPS TENDON REPAIR Left 08/10/2019   Procedure: REPAIR QUADRICEP TENDON RUPTURE;  Surgeon: DMeredith Pel MD;  [facility]  Service: Orthopedics;  Laterality: Left;   WISDOM TOOTH EXTRACTION     Family History  Problem Relation Age of Onset   Cancer Mother        lymphoscarcoma   Stroke Father    Colon cancer Neg Hx    Rectal cancer Neg Hx    Social History   Socioeconomic History   Marital status: Married    Spouse name: Not on file   Number of children: Not on file   Years of education: Not on file   Highest education level: Not on file  Occupational History   Occupation: business development  Tobacco Use   Smoking status: Former    Types: Cigarettes    Quit date: 05/10/1964    Years since quitting: 57.6   Smokeless tobacco: Never  Vaping Use   Vaping Use: Never  used  Substance and Sexual Activity   Alcohol use: Yes    Alcohol/week: 20.0 standard drinks of alcohol    Types: 10 Glasses of wine, 10 Standard drinks or equivalent per week    Comment: wine with dinner   Drug use: No   Sexual activity: Not Currently  Other Topics Concern   Not on file  Social History Narrative   Not on file   Social Determinants of Health   Financial Resource Strain: Low Risk  (12/18/2021)   Overall Financial Resource Strain (CARDIA)    Difficulty of Paying Living Expenses: Not hard at all  Food Insecurity: No Food Insecurity (12/18/2021)   Hunger Vital Sign    Worried About Running Out of Food in the Last Year: Never true    Ran Out of  Food in the Last Year: Never true  Transportation Needs: No Transportation Needs (12/18/2021)   PRAPARE - Hydrologist (Medical): No    Lack of Transportation (Non-Medical): No  Physical Activity: Insufficiently Active (12/18/2021)   Exercise Vital Sign    Days of Exercise per Week: 4 days    Minutes of Exercise per Session: 30 min  Stress: No Stress Concern Present (12/18/2021)   [place]    Feeling of Stress : Not at all  Social Connections: Moderately Integrated (12/18/2021)   Social Connection and Isolation Panel [NHANES]    Frequency of Communication with Friends and Family: More than three times a week    Frequency of Social Gatherings with Friends and Family: Never    Attends Religious Services: Never    Marine scientist or Organizations: Yes    Attends Music therapist: More than 4 times per year    Marital Status: Married    Tobacco Counseling Counseling given: Not Answered   Clinical Intake:  Pre-visit preparation completed: Yes  Pain : No/denies pain     Diabetes: No  How often do you need to have someone help you when you read instructions, pamphlets, or other written materials from your doctor or pharmacy?: 1 - Never  Diabetic?  no  Interpreter Needed?: No  Information entered by :: Leroy Kennedy LPN   Activities of Daily Living    12/18/2021   12:10 PM  In your present state of health, do you have any difficulty performing the following activities:  Hearing? 0  Vision? 0  Difficulty concentrating or making decisions? 0  Walking or climbing stairs? 0  Dressing or bathing? 0  Doing errands, shopping? 0  Preparing Food and eating ? N  Using the Toilet? N  In the past six months, have you accidently leaked urine? Y  Do you have problems with loss of bowel control? N  Managing your Medications? N  Managing your Finances? N   Housekeeping or managing your Housekeeping? N    Patient Care Team: Wendie Agreste, MD as PCP - General (Family Medicine)  Indicate any recent Medical Services you may have received from other than [facility] providers in the past year (date may be approximate).     Assessment:   This is a routine wellness examination for Matthew Simon.  Hearing/Vision screen Hearing Screening - Comments:: No trouble hearing Vision Screening - Comments:: Up to date [facility]  Dietary issues and exercise activities discussed: Current Exercise Habits: Home exercise routine, Type of exercise: walking;strength training/weights, Time (Minutes): 30, Frequency (Times/Week): 4, Weekly Exercise (Minutes/Week): 120, Intensity:  Mild   Goals Addressed   None    Depression Screen    12/18/2021   12:14 PM 12/08/2021   12:11 PM 05/31/2020    9:20 AM 11/20/2019    2:48 PM 01/04/2019    8:50 AM 11/23/2018    9:21 AM 10/06/2018    4:51 PM  PHQ 2/9 Scores  PHQ - 2 Score 0 1 0 0 0 0 0  PHQ- 9 Score 0 3         Fall Risk    12/18/2021   12:09 PM 12/08/2021   12:12 PM 05/31/2020    9:20 AM 11/20/2019    2:48 PM 01/04/2019    8:50 AM  [place] in the past year? 0 0 0 1 0  Number falls in past yr: 0 0  0 0  Injury with Fall? 0 0  1 0  Risk for fall due to :  No Fall Risks     Follow up Falls evaluation completed;Education provided;Falls prevention discussed Falls evaluation completed Falls evaluation completed Falls evaluation completed Falls evaluation completed    FALL RISK PREVENTION PERTAINING TO THE HOME:  Any stairs in or around the home? Yes  If so, are there any without handrails? No  Home free of loose throw rugs in walkways, pet beds, electrical cords, etc? Yes  Adequate lighting in your home to reduce risk of falls? Yes   ASSISTIVE DEVICES UTILIZED TO PREVENT FALLS:  Life alert? No  Use of a cane, walker or w/c? No  Grab bars in the bathroom? No  Shower chair or bench in shower? No   Elevated toilet seat or a handicapped toilet? No   TIMED UP AND GO:  Was the test performed? No .    Cognitive Function:        12/18/2021   12:11 PM 05/31/2020    9:18 AM  6CIT Screen  What Year? 0 points 0 points  What month? 0 points 0 points  What time? 0 points 0 points  Count back from 20 0 points 0 points  Months in reverse 0 points 0 points  Repeat phrase 0 points 0 points  Total Score 0 points 0 points    Immunizations Immunization History  Administered Date(s) Administered   Fluad Quad(high Dose 65+) 11/20/2019   Influenza, Seasonal, Injecte, Preservative Fre 02/12/2012   Influenza,inj,Quad PF,6+ Mos 12/24/2014   PFIZER Comirnaty(Gray Top)Covid-19 Tri-Sucrose Vaccine 03/01/2019, 03/22/2019   Pneumococcal Conjugate-13 02/21/2015   Pneumococcal Polysaccharide-23 02/26/2010, 05/05/2016, 08/12/2019   Zoster Recombinat (Shingrix) 05/31/2020    TDAP status: Due, Education has been provided regarding the importance of this vaccine. Advised may receive this vaccine at local pharmacy or Health Dept. Aware to provide a copy of the vaccination record if obtained from local pharmacy or Health Dept. Verbalized acceptance and understanding.  Flu Vaccine status: Due, Education has been provided regarding the importance of this vaccine. Advised may receive this vaccine at local pharmacy or Health Dept. Aware to provide a copy of the vaccination record if obtained from local pharmacy or Health Dept. Verbalized acceptance and understanding.  Pneumococcal vaccine status: Up to date  Covid-19 vaccine status: Information provided on how to obtain vaccines.   Qualifies for Shingles Vaccine? Yes   Zostavax completed No   Shingrix Completed?: No.    Education has been provided regarding the importance of this vaccine. Patient has been advised to call insurance company to determine out of pocket expense if they have not  yet received this vaccine. Advised may also receive vaccine at  local pharmacy or Health Dept. Verbalized acceptance and understanding.  Screening Tests Health Maintenance  Topic Date Due   Diabetic kidney evaluation - Urine ACR  Never done   Zoster Vaccines- Shingrix (2 of 2) 07/26/2020   HEMOGLOBIN A1C  11/30/2020   Diabetic kidney evaluation - GFR measurement  05/31/2021   FOOT EXAM  05/31/2021   COVID-19 Vaccine (4 - Pfizer series) 12/24/2021 (Originally 05/21/2020)   INFLUENZA VACCINE  05/03/2022 (Originally 09/02/2021)   COLONOSCOPY (Pts 45-30yr Insurance coverage will need to be confirmed)  09/05/2022 (Originally 05/12/2020)   TETANUS/TDAP  09/05/2022 (Originally 07/17/1969)   OPHTHALMOLOGY EXAM  10/30/2022   Medicare Annual Wellness (AWV)  12/19/2022   Pneumonia Vaccine 71 Years old  Completed   Hepatitis C Screening  Completed   HPV VACCINES  Aged Out    Health Maintenance  Health Maintenance Due  Topic Date Due   Diabetic kidney evaluation - Urine ACR  Never done   Zoster Vaccines- Shingrix (2 of 2) 07/26/2020   HEMOGLOBIN A1C  11/30/2020   Diabetic kidney evaluation - GFR measurement  05/31/2021   FOOT EXAM  05/31/2021    Colorectal cancer screening: Referral to GI placed  . Pt aware the office will call re: appt.  Lung Cancer Screening: (Low Dose CT Chest recommended if Age 72-80years, 30 pack-year currently smoking OR have quit w/in 15years.) does not qualify.   Lung Cancer Screening Referral:   Additional Screening:  Hepatitis C Screening: does not qualify; Completed   Vision Screening: Recommended annual ophthalmology exams for early detection of glaucoma and other disorders of the eye. Is the patient up to date with their annual eye exam?  Yes  Who is the provider or what is the name of the office in which the patient attends annual eye exams? My eye doctor If pt is not established with a provider, would they like to be referred to a provider to establish care? No .   Dental Screening: Recommended annual dental exams  for proper oral hygiene  Community Resource Referral / Chronic Care Management: CRR required this visit?  No   CCM required this visit?  No      Plan:     I have personally reviewed and noted the following in the patient's chart:   Medical and social history Use of alcohol, tobacco or illicit drugs  Current medications and supplements including opioid prescriptions. Patient is not currently taking opioid prescriptions. Functional ability and status Nutritional status Physical activity Advanced directives List of other physicians Hospitalizations, surgeries, and ER visits in previous 12 months Vitals Screenings to include cognitive, depression, and falls Referrals and appointments  In addition, I have reviewed and discussed with patient certain preventive protocols, quality metrics, and best practice recommendations. A written personalized care plan for preventive services as well as general preventive health recommendations were provided to patient.     JLeroy Kennedy LPN   [id number]  Nurse Notes:

## 2021-12-19 ENCOUNTER — Other Ambulatory Visit: Payer: Self-pay

## 2021-12-19 DIAGNOSIS — N419 Inflammatory disease of prostate, unspecified: Secondary | ICD-10-CM

## 2021-12-19 DIAGNOSIS — R350 Frequency of micturition: Secondary | ICD-10-CM

## 2021-12-19 MED ORDER — SULFAMETHOXAZOLE-TRIMETHOPRIM 800-160 MG PO TABS: 1.0000 | ORAL_TABLET | Freq: Two times a day (BID) | ORAL | 0 refills | Status: DC

## 2021-12-19 NOTE — Telephone Encounter (Signed)
I spoke to the pt and advised we will refill the Bactrim DS one time . If symptoms does not improve he will need an apt to be seen . I advised him that he will need to go to [facility] on [address] to have his PSA drew and that order is in . Pt expressed verbal understanding

## 2022-03-27 DIAGNOSIS — H524 Presbyopia: Secondary | ICD-10-CM | POA: Diagnosis not present

## 2022-03-27 DIAGNOSIS — H52223 Regular astigmatism, bilateral: Secondary | ICD-10-CM | POA: Diagnosis not present

## 2022-05-28 ENCOUNTER — Other Ambulatory Visit: Payer: Self-pay | Admitting: Family Medicine

## 2022-05-28 DIAGNOSIS — I10 Essential (primary) hypertension: Secondary | ICD-10-CM

## 2022-05-28 DIAGNOSIS — R300 Dysuria: Secondary | ICD-10-CM

## 2022-05-28 DIAGNOSIS — M109 Gout, unspecified: Secondary | ICD-10-CM

## 2022-05-28 MED ORDER — AMLODIPINE BESYLATE 5 MG PO TABS: 5.0000 mg | ORAL_TABLET | Freq: Every day | ORAL | 0 refills | Status: DC

## 2022-05-28 MED ORDER — ALLOPURINOL 100 MG PO TABS: 100.0000 mg | ORAL_TABLET | Freq: Every day | ORAL | 0 refills | Status: DC

## 2022-05-28 MED ORDER — LISINOPRIL 20 MG PO TABS: 20.0000 mg | ORAL_TABLET | Freq: Every day | ORAL | 0 refills | Status: DC

## 2022-05-28 MED ORDER — DOXAZOSIN MESYLATE 2 MG PO TABS: 2.0000 mg | ORAL_TABLET | Freq: Every day | ORAL | 0 refills | Status: DC

## 2022-05-28 NOTE — Telephone Encounter (Signed)
Pt requesting refill all meds due to them being misplaced

## 2022-05-28 NOTE — Addendum Note (Signed)
Addended by: Eldred Manges on: 05/28/2022 02:29 PM   Modules accepted: Orders

## 2022-05-28 NOTE — Addendum Note (Signed)
Addended by: Meredith Staggers R on: 05/28/2022 06:03 PM   Modules accepted: Orders

## 2022-05-28 NOTE — Telephone Encounter (Signed)
Sure.  He is overdue for appointment and labs but I do see the appointment on April 29.  Keep that appointment.  Will refill meds temporarily.

## 2022-05-28 NOTE — Telephone Encounter (Signed)
Caller name: [name]  On DPR?: Yes  Call back number: [phone] (mobile)  Provider they see: Shade Flood, MD  Reason for call:  Patient called stating that he can't find his medications. Patient hasn't had his medications in three days. Patient wants to know if Dr.Greene could refill his medications allopurinol (ZYLOPRIM) 100 MG,doxazosin (CARDURA) 2 MG,amLODipine (NORVASC) 5 MG and lisinopril (ZESTRIL) 20 MG. Patient pharmacy is  [id number] - [place], [place] - [address] .  Patient has a up coming appt with Dr.Greene on Monday.

## 2022-06-01 ENCOUNTER — Encounter: Payer: Self-pay | Admitting: Family Medicine

## 2022-06-01 ENCOUNTER — Ambulatory Visit (INDEPENDENT_AMBULATORY_CARE_PROVIDER_SITE_OTHER): Payer: Medicare HMO | Admitting: Family Medicine

## 2022-06-01 VITALS — BP 152/74 | HR 60 | Temp 99.2°F | Ht 70.0 in | Wt 181.4 lb

## 2022-06-01 DIAGNOSIS — E785 Hyperlipidemia, unspecified: Secondary | ICD-10-CM | POA: Diagnosis not present

## 2022-06-01 DIAGNOSIS — R300 Dysuria: Secondary | ICD-10-CM | POA: Diagnosis not present

## 2022-06-01 DIAGNOSIS — I10 Essential (primary) hypertension: Secondary | ICD-10-CM | POA: Diagnosis not present

## 2022-06-01 DIAGNOSIS — M109 Gout, unspecified: Secondary | ICD-10-CM

## 2022-06-01 DIAGNOSIS — R739 Hyperglycemia, unspecified: Secondary | ICD-10-CM | POA: Diagnosis not present

## 2022-06-01 DIAGNOSIS — Z87898 Personal history of other specified conditions: Secondary | ICD-10-CM

## 2022-06-01 MED ORDER — DOXAZOSIN MESYLATE 2 MG PO TABS: 2.0000 mg | ORAL_TABLET | Freq: Every day | ORAL | 1 refills | Status: DC

## 2022-06-01 MED ORDER — AMLODIPINE BESYLATE 5 MG PO TABS: 5.0000 mg | ORAL_TABLET | Freq: Every day | ORAL | 1 refills | Status: DC

## 2022-06-01 MED ORDER — ALLOPURINOL 100 MG PO TABS: 100.0000 mg | ORAL_TABLET | Freq: Every day | ORAL | 1 refills | Status: DC

## 2022-06-01 MED ORDER — LISINOPRIL 20 MG PO TABS: 20.0000 mg | ORAL_TABLET | Freq: Every day | ORAL | 0 refills | Status: DC

## 2022-06-01 NOTE — Patient Instructions (Signed)
Thanks for coming in today.  I will check labs, no med changes for now.  4-month follow-up for physical.  Check your blood pressure once you are back on your medications consistently and if that is running above 140/90, let me know.  I suspect that we will be back in controlled level.  Watch for signs of urinary retention or difficulty producing urine and if that occurs be seen right away.  I think that will also improve back on doxazosin.  Take care

## 2022-06-01 NOTE — Progress Notes (Signed)
Subjective:  Patient ID: Matthew Simon, male    DOB: 08/01/1950  Age: 72 y.o. MRN: [id number]  CC:  Chief Complaint  Patient presents with   Medication Refill    Lost meds in move no meds since 05/25/2022 Pt requesting at least 10 tablets of each Rx to "hold him over"    HPI Matthew Simon presents for   Hypertension: Previously treated with amlodipine 5 mg daily, lisinopril 20 mg daily, doxazosin 2 mg daily.  Overdue for follow-up/labs. Off meds past week, - new Rx ordered 4/25 - Recently moved - meds lost during move. Prior house for 34 years, downsizing.  Some trouble initiating urination off meds. No abd pain, able to urinate but more difficult to onset.  Home readings on meds - 120/70's.  No new side effects.  BP Readings from Last 3 Encounters:  06/01/22 (!) 152/74  12/08/21 132/60  09/04/21 128/72   Lab Results  Component Value Date   CREATININE 1.25 05/31/2020   Gout: Last flare:none. Some knee swelling with getting up and down stairs with move.  Daily meds: Allopurinol 100 mg daily. No new side effects.  Prn med:  Lab Results  Component Value Date   LABURIC 5.1 03/21/2018   Hyperlipidemia: The 10-year ASCVD risk score (Arnett DK, et al., 2019) is: 40%   Values used to calculate the score:     Age: 37 years     Sex: Male     Is Non-Hispanic African American: Yes     Diabetic: Yes     Tobacco smoker: No     Systolic Blood Pressure: 152 mmHg     Is BP treated: Yes     HDL Cholesterol: 76.2 mg/dL     Total Cholesterol: 183 mg/dL  Lab Results  Component Value Date   CHOL 183 05/31/2020   HDL 76.20 05/31/2020   LDLCALC 97 05/31/2020   TRIG 50.0 05/31/2020   CHOLHDL 2 05/31/2020   Lab Results  Component Value Date   ALT 16 05/31/2020   AST 21 05/31/2020   ALKPHOS 41 05/31/2020   BILITOT 0.8 05/31/2020      History Patient Active Problem List   Diagnosis Date Noted   Rupture of left quadriceps tendon 08/09/2019   Soft tissue mass 05/22/2017    Gout 05/05/2016   Hypertension 04/10/2011   Colon polyp 04/10/2011   Past Medical History:  Diagnosis Date   Gout    Hypertension    Past Surgical History:  Procedure Laterality Date   ARTHROSCOPIC REPAIR ACL Left    apx July    COLONOSCOPY     EXCISION MASS NECK N/A 05/24/2017   Procedure: EXCISION POSTERIOR NECK MASS;  Surgeon: Darnell Level, MD;  Location: [facility] OR;  Service: General;  Laterality: N/A;   QUADRICEPS TENDON REPAIR Left 08/10/2019   Procedure: REPAIR QUADRICEP TENDON RUPTURE;  Surgeon: Cammy Copa, MD;  Location: [facility] OR;  Service: Orthopedics;  Laterality: Left;   WISDOM TOOTH EXTRACTION     No Known Allergies Prior to Admission medications   Medication Sig Start Date End Date Taking? Authorizing Provider  allopurinol (ZYLOPRIM) 100 MG tablet Take 1 tablet (100 mg total) by mouth daily. 05/28/22  Yes Shade Flood, MD  amLODipine (NORVASC) 5 MG tablet Take 1 tablet (5 mg total) by mouth daily. 05/28/22  Yes Shade Flood, MD  doxazosin (CARDURA) 2 MG tablet Take 1 tablet (2 mg total) by mouth daily. 05/28/22  Yes Shade Flood,  MD  lisinopril (ZESTRIL) 20 MG tablet Take 1 tablet (20 mg total) by mouth daily. 05/28/22  Yes Shade Flood, MD  sulfamethoxazole-trimethoprim (BACTRIM DS) 800-160 MG tablet Take 1 tablet by mouth 2 (two) times daily. 12/19/21  Yes Shade Flood, MD  tiZANidine (ZANAFLEX) 4 MG tablet Take 1 tablet (4 mg total) by mouth every 6 (six) hours as needed for muscle spasms. Patient not taking: Reported on 12/08/2021 09/04/21   Shade Flood, MD   Social History   Socioeconomic History   Marital status: Married    Spouse name: Not on file   Number of children: Not on file   Years of education: Not on file   Highest education level: Not on file  Occupational History   Occupation: business development  Tobacco Use   Smoking status: Former    Types: Cigarettes    Quit date: 05/10/1964    Years since quitting: 58.0    Smokeless tobacco: Never  Vaping Use   Vaping Use: Never used  Substance and Sexual Activity   Alcohol use: Yes    Alcohol/week: 20.0 standard drinks of alcohol    Types: 10 Glasses of wine, 10 Standard drinks or equivalent per week    Comment: wine with dinner   Drug use: No   Sexual activity: Not Currently  Other Topics Concern   Not on file  Social History Narrative   Not on file   Social Determinants of Health   Financial Resource Strain: Low Risk  (12/18/2021)   Overall Financial Resource Strain (CARDIA)    Difficulty of Paying Living Expenses: Not hard at all  Food Insecurity: No Food Insecurity (12/18/2021)   Hunger Vital Sign    Worried About Running Out of Food in the Last Year: Never true    Ran Out of Food in the Last Year: Never true  Transportation Needs: No Transportation Needs (12/18/2021)   PRAPARE - Administrator, Civil Service (Medical): No    Lack of Transportation (Non-Medical): No  Physical Activity: Insufficiently Active (12/18/2021)   Exercise Vital Sign    Days of Exercise per Week: 4 days    Minutes of Exercise per Session: 30 min  Stress: No Stress Concern Present (12/18/2021)   [employer] of Occupational Health - Occupational Stress Questionnaire    Feeling of Stress : Not at all  Social Connections: Moderately Integrated (12/18/2021)   Social Connection and Isolation Panel [NHANES]    Frequency of Communication with Friends and Family: More than three times a week    Frequency of Social Gatherings with Friends and Family: Never    Attends Religious Services: Never    Database administrator or Organizations: Yes    Attends Engineer, structural: More than 4 times per year    Marital Status: Married  Catering manager Violence: Not At Risk (12/18/2021)   Humiliation, Afraid, Rape, and Kick questionnaire    Fear of Current or Ex-Partner: No    Emotionally Abused: No    Physically Abused: No    Sexually Abused: No     Review of Systems  Constitutional:  Negative for fatigue and unexpected weight change.  Eyes:  Negative for visual disturbance.  Respiratory:  Negative for cough, chest tightness and shortness of breath.   Cardiovascular:  Negative for chest pain, palpitations and leg swelling.  Gastrointestinal:  Negative for abdominal pain and blood in stool.  Genitourinary:  Positive for difficulty urinating. Negative for dysuria and hematuria.  Neurological:  Negative for dizziness, light-headedness and headaches.     Objective:   Vitals:   06/01/22 1503  BP: (!) 152/74  Pulse: 60  Temp: 99.2 F (37.3 C)  SpO2: 100%  Weight: 181 lb 6.4 oz (82.3 kg)  Height: 5\' 10"  (1.778 m)     Physical Exam Vitals reviewed.  Constitutional:      Appearance: He is well-developed.  HENT:     Head: Normocephalic and atraumatic.  Neck:     Vascular: No carotid bruit or JVD.  Cardiovascular:     Rate and Rhythm: Normal rate and regular rhythm.     Heart sounds: Normal heart sounds. No murmur heard. Pulmonary:     Effort: Pulmonary effort is normal.     Breath sounds: Normal breath sounds. No rales.  Musculoskeletal:     Right lower leg: No edema.     Left lower leg: No edema.  Skin:    General: Skin is warm and dry.  Neurological:     Mental Status: He is alert and oriented to person, place, and time.  Psychiatric:        Mood and Affect: Mood normal.        Assessment & Plan:  Matthew Simon is a 72 y.o. male . Hypertension, unspecified type - Plan: Comp Met (CMET)  -Elevated off meds, restart usual regimen, home monitoring with RTC precautions, check labs.  Printed supply to fill locally if unable to be filled by insurance given recent prescription that was lost.  22-month follow-up for physical.  Gout, unspecified cause, unspecified chronicity, unspecified site - Plan: Uric acid, allopurinol (ZYLOPRIM) 100 MG tablet  -Check labs, stable with allopurinol, continue  same  Hyperlipidemia, unspecified hyperlipidemia type - Plan: Lipid panel  -Check labs with adjustment in plan accordingly.  No current statin.  Elevated ASCVD risk score previously.  Hyperglycemia - Plan: Urine Microalbumin w/creat. ratio  -Prior hyperglycemia, hypertension as above, check urine microalbumin.  History of dysuria Dysuria - Plan: doxazosin (CARDURA) 2 MG tablet  -Cardura has been helpful previously, some recurrence of symptoms off meds, no symptoms of retention, but ER precautions were given.  RTC precautions given if not improving back on meds  Essential hypertension - Plan: amLODipine (NORVASC) 5 MG tablet, doxazosin (CARDURA) 2 MG tablet, lisinopril (ZESTRIL) 20 MG tablet  -As above, meds refilled.  No orders of the defined types were placed in this encounter.  Patient Instructions  Thanks for coming in today.  I will check labs, no med changes for now.  40-month follow-up for physical.  Check your blood pressure once you are back on your medications consistently and if that is running above 140/90, let me know.  I suspect that we will be back in controlled level.  Watch for signs of urinary retention or difficulty producing urine and if that occurs be seen right away.  I think that will also improve back on doxazosin.  Take care     Signed,   Meredith Staggers, MD [facility], [facility] 06/01/22 3:12 PM

## 2022-06-02 ENCOUNTER — Encounter: Payer: Self-pay | Admitting: Family Medicine

## 2022-06-02 LAB — LIPID PANEL
Cholesterol: 167 mg/dL (ref 0–200)
HDL: 64.4 mg/dL (ref >39.00)
LDL Cholesterol: 86 mg/dL (ref 0–99)
NonHDL: 102.42
Total CHOL/HDL Ratio: 3
Triglycerides: 83 mg/dL (ref 0.0–149.0)
VLDL: 16.6 mg/dL (ref 0.0–40.0)

## 2022-06-02 LAB — COMPREHENSIVE METABOLIC PANEL
ALT: 54 U/L — ABNORMAL HIGH (ref 0–53)
AST: 39 U/L — ABNORMAL HIGH (ref 0–37)
Albumin: 4 g/dL (ref 3.5–5.2)
Alkaline Phosphatase: 38 U/L — ABNORMAL LOW (ref 39–117)
BUN: 12 mg/dL (ref 6–23)
CO2: 29 mEq/L (ref 19–32)
Calcium: 9.2 mg/dL (ref 8.4–10.5)
Chloride: 104 mEq/L (ref 96–112)
Creatinine, Ser: 1.11 mg/dL (ref 0.40–1.50)
GFR: 66.17 mL/min (ref >60.00)
Glucose, Bld: 122 mg/dL — ABNORMAL HIGH (ref 70–99)
Potassium: 4.1 mEq/L (ref 3.5–5.1)
Sodium: 140 mEq/L (ref 135–145)
Total Bilirubin: 0.6 mg/dL (ref 0.2–1.2)
Total Protein: 6.5 g/dL (ref 6.0–8.3)

## 2022-06-02 LAB — URIC ACID: Uric Acid, Serum: 5.8 mg/dL (ref 4.0–7.8)

## 2022-06-02 LAB — MICROALBUMIN / CREATININE URINE RATIO
Creatinine,U: 151.3 mg/dL
Microalb Creat Ratio: 10.6 mg/g (ref 0.0–30.0)
Microalb, Ur: 16 mg/dL — ABNORMAL HIGH (ref 0.0–1.9)

## 2022-08-27 ENCOUNTER — Other Ambulatory Visit: Payer: Self-pay | Admitting: Family Medicine

## 2022-08-27 DIAGNOSIS — I10 Essential (primary) hypertension: Secondary | ICD-10-CM

## 2022-09-18 DIAGNOSIS — H5200 Hypermetropia, unspecified eye: Secondary | ICD-10-CM | POA: Diagnosis not present

## 2022-10-21 ENCOUNTER — Ambulatory Visit (INDEPENDENT_AMBULATORY_CARE_PROVIDER_SITE_OTHER): Payer: Medicare HMO | Admitting: *Deleted

## 2022-10-21 DIAGNOSIS — Z0000 Encounter for general adult medical examination without abnormal findings: Secondary | ICD-10-CM | POA: Diagnosis not present

## 2022-10-21 DIAGNOSIS — Z1211 Encounter for screening for malignant neoplasm of colon: Secondary | ICD-10-CM

## 2022-10-21 NOTE — Progress Notes (Signed)
Subjective:   Matthew Simon is a 72 y.o. male who presents for Medicare Annual/Subsequent preventive examination.  Visit Complete: Virtual  I connected with  [name] on 10/21/22 by a audio enabled telemedicine application and verified that I am speaking with the correct person using two identifiers.  Patient Location: Home  Provider Location: Home Office  I discussed the limitations of evaluation and management by telemedicine. The patient expressed understanding and agreed to proceed.  Vital Signs: Unable to obtain new vitals due to this being a telehealth visit.        Objective:    There were no vitals filed for this visit. There is no height or weight on file to calculate BMI.     10/21/2022    8:34 AM 05/31/2020   10:07 AM 08/10/2019    3:39 PM 08/10/2019   12:31 PM 05/18/2017    1:38 PM 05/12/2017    9:03 AM  Advanced Directives  Does Patient Have a Medical Advance Directive? No No No No No Yes  Type of Advance Directive      Living will  Would patient like information on creating a medical advance directive? No - Patient declined Yes (MAU/Ambulatory/Procedural Areas - Information given) No - Patient declined No - Patient declined No - Patient declined     Current Medications (verified) Outpatient Encounter Medications as of 10/21/2022  Medication Sig   allopurinol (ZYLOPRIM) 100 MG tablet Take 1 tablet (100 mg total) by mouth daily.   amLODipine (NORVASC) 5 MG tablet Take 1 tablet (5 mg total) by mouth daily.   doxazosin (CARDURA) 2 MG tablet Take 1 tablet (2 mg total) by mouth daily.   lisinopril (ZESTRIL) 20 MG tablet TAKE 1 TABLET BY MOUTH EVERY DAY   No facility-administered encounter medications on file as of 10/21/2022.    Allergies (verified) Patient has no known allergies.   History: Past Medical History:  Diagnosis Date   Gout    Hypertension    Past Surgical History:  Procedure Laterality Date   ARTHROSCOPIC REPAIR ACL Left    apx July     COLONOSCOPY     EXCISION MASS NECK N/A 05/24/2017   Procedure: EXCISION POSTERIOR NECK MASS;  Surgeon: Darnell Level, MD;  Location: [facility] OR;  Service: General;  Laterality: N/A;   QUADRICEPS TENDON REPAIR Left 08/10/2019   Procedure: REPAIR QUADRICEP TENDON RUPTURE;  Surgeon: Cammy Copa, MD;  Location: [facility] OR;  Service: Orthopedics;  Laterality: Left;   WISDOM TOOTH EXTRACTION     Family History  Problem Relation Age of Onset   Cancer Mother        lymphoscarcoma   Stroke Father    Colon cancer Neg Hx    Rectal cancer Neg Hx    Social History   Socioeconomic History   Marital status: Married    Spouse name: Not on file   Number of children: Not on file   Years of education: Not on file   Highest education level: Not on file  Occupational History   Occupation: business development  Tobacco Use   Smoking status: Former    Current packs/day: 0.00    Types: Cigarettes    Quit date: 05/10/1964    Years since quitting: 58.4   Smokeless tobacco: Never  Vaping Use   Vaping status: Never Used  Substance and Sexual Activity   Alcohol use: Yes    Alcohol/week: 20.0 standard drinks of alcohol    Types: 10 Glasses of  wine, 10 Standard drinks or equivalent per week    Comment: wine with dinner   Drug use: No   Sexual activity: Not Currently  Other Topics Concern   Not on file  Social History Narrative   Not on file   Social Determinants of Health   Financial Resource Strain: Low Risk  (10/21/2022)   Overall Financial Resource Strain (CARDIA)    Difficulty of Paying Living Expenses: Not hard at all  Food Insecurity: No Food Insecurity (10/21/2022)   Hunger Vital Sign    Worried About Running Out of Food in the Last Year: Never true    Ran Out of Food in the Last Year: Never true  Transportation Needs: No Transportation Needs (10/21/2022)   PRAPARE - Administrator, Civil Service (Medical): No    Lack of Transportation (Non-Medical): No  Physical Activity: Inactive  (10/21/2022)   Exercise Vital Sign    Days of Exercise per Week: 0 days    Minutes of Exercise per Session: 0 min  Stress: No Stress Concern Present (10/21/2022)   [employer] of Occupational Health - Occupational Stress Questionnaire    Feeling of Stress : Not at all  Social Connections: Socially Integrated (10/21/2022)   Social Connection and Isolation Panel [NHANES]    Frequency of Communication with Friends and Family: Three times a week    Frequency of Social Gatherings with Friends and Family: Once a week    Attends Religious Services: 1 to 4 times per year    Active Member of [employer] or Organizations: Yes    Attends Engineer, structural: More than 4 times per year    Marital Status: Married    Tobacco Counseling Counseling given: Not Answered   Clinical Intake:  Pre-visit preparation completed: Yes        Diabetes: No  How often do you need to have someone help you when you read instructions, pamphlets, or other written materials from your doctor or pharmacy?: 1 - Never  Interpreter Needed?: No  Information entered by :: Remi Haggard LPN   Activities of Daily Living    12/18/2021   12:10 PM  In your present state of health, do you have any difficulty performing the following activities:  Hearing? 0  Vision? 0  Difficulty concentrating or making decisions? 0  Walking or climbing stairs? 0  Dressing or bathing? 0  Doing errands, shopping? 0  Preparing Food and eating ? N  Using the Toilet? N  In the past six months, have you accidently leaked urine? Y  Do you have problems with loss of bowel control? N  Managing your Medications? N  Managing your Finances? N  Housekeeping or managing your Housekeeping? N    Patient Care Team: Shade Flood, MD as PCP - General (Family Medicine)  Indicate any recent Medical Services you may have received from other than [facility] providers in the past year (date may be approximate).     Assessment:   This  is a routine wellness examination for [name].  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 0 0 0 0 0  Number falls in past yr: 0 0 0 0   Injury with Fall? 0 0 0 0   Risk for fall due to :  No Fall Risks  No Fall Risks   Follow up Falls evaluation completed;Education provided;Falls prevention discussed Falls evaluation completed Falls evaluation completed;Education provided;Falls prevention discussed Falls evaluation completed Falls evaluation completed    MEDICARE RISK AT HOME: Medicare Risk at Home Any stairs in or around the home?: Yes If so, are there any without handrails?: No Home free of loose throw rugs in walkways, pet beds, electrical cords, etc?: Yes Adequate lighting in your home to reduce risk of falls?: Yes Life alert?: No Use of a cane, walker or w/c?: No Grab bars in the bathroom?: No Shower chair or bench in shower?: No Elevated toilet seat or a handicapped toilet?: No  TIMED UP AND GO:  Was the test performed?  No    Cognitive Function:        10/21/2022    8:38 AM 12/18/2021   12:11 PM 05/31/2020    9:18 AM  6CIT Screen  What Year? 0 points 0 points 0 points  What month? 0 points 0 points 0 points  What time? 0 points 0 points 0 points  Count back from 20 0 points 0 points 0 points  Months in reverse 0 points 0 points 0 points   Repeat phrase 0 points 0 points 0 points  Total Score 0 points 0 points 0 points    Immunizations Immunization History  Administered Date(s) Administered   Fluad Quad(high Dose 65+) 11/20/2019   Influenza, Seasonal, Injecte, Preservative Fre 02/12/2012   Influenza,inj,Quad PF,6+ Mos 12/24/2014   PFIZER Comirnaty(Gray Top)Covid-19 Tri-Sucrose Vaccine 03/01/2019, 03/22/2019, 03/26/2020   Pneumococcal Conjugate-13 02/21/2015   Pneumococcal Polysaccharide-23 02/26/2010, 05/05/2016, 08/12/2019   Zoster Recombinant(Shingrix) 05/31/2020    TDAP status: Due, Education has been provided regarding the importance of this vaccine. Advised may receive this vaccine at local pharmacy or Health Dept. Aware to provide a copy of the vaccination record if obtained from local pharmacy or Health Dept. Verbalized acceptance and understanding.  Flu Vaccine status: Due, Education has been provided regarding the importance of this vaccine. Advised may receive this vaccine at local pharmacy or Health Dept. Aware to provide a copy of the vaccination record if obtained from local pharmacy or Health Dept. Verbalized acceptance and understanding.  Pneumococcal vaccine status: Up to date  Covid-19 vaccine status: Declined, Education has been provided regarding the importance of this vaccine but patient still declined. Advised may receive this vaccine at local pharmacy or Health Dept.or vaccine clinic. Aware to provide a copy of the vaccination record if obtained from local pharmacy or Health Dept. Verbalized acceptance and understanding.  Qualifies for Shingles Vaccine? Yes   Zostavax completed No   Shingrix Completed?: No.    Education has been provided regarding  the importance of this vaccine. Patient has been advised to call insurance company to determine out of pocket expense if they have not yet received this vaccine. Advised may also receive vaccine at local pharmacy or Health Dept. Verbalized acceptance and  understanding.  Screening Tests Health Maintenance  Topic Date Due   HEMOGLOBIN A1C  11/30/2020   FOOT EXAM  05/31/2021   COVID-19 Vaccine (4 - 2023-24 season) 11/06/2022 (Originally 10/04/2022)   Colonoscopy  11/20/2022 (Originally 05/12/2020)   INFLUENZA VACCINE  05/03/2023 (Originally 09/03/2022)   Zoster Vaccines- Shingrix (2 of 2) 01/20/2024 (Originally 07/26/2020)   OPHTHALMOLOGY EXAM  10/30/2022   Diabetic kidney evaluation - eGFR measurement  06/01/2023   Diabetic kidney evaluation - Urine ACR  06/01/2023   Medicare Annual Wellness (AWV)  10/21/2023   Pneumonia Vaccine 37+ Years old  Completed   Hepatitis C Screening  Completed   HPV VACCINES  Aged Out   DTaP/Tdap/Td  Discontinued    Health Maintenance  Health Maintenance Due  Topic Date Due   HEMOGLOBIN A1C  11/30/2020   FOOT EXAM  05/31/2021    Colorectal cancer screening: Referral to GI placed  . Pt aware the office will call re: appt.  Lung Cancer Screening: (Low Dose CT Chest recommended if Age 52-80 years, 20 pack-year currently smoking OR have quit w/in 15years.) does not qualify.   Lung Cancer Screening Referral:   Additional Screening:  Hepatitis C Screening: does not qualify; Completed 2017  Vision Screening: Recommended annual ophthalmology exams for early detection of glaucoma and other disorders of the eye. Is the patient up to date with their annual eye exam?  Yes  Who is the provider or what is the name of the office in which the patient attends annual eye exams? [facility] If pt is not established with a provider, would they like to be referred to a provider to establish care? No .   Dental Screening: Recommended annual dental exams for proper oral hygiene    Community Resource Referral / Chronic Care Management: CRR required this visit?  No   CCM required this visit?  No     Plan:     I have personally reviewed and noted the following in the patient's chart:   Medical and social  history Use of alcohol, tobacco or illicit drugs  Current medications and supplements including opioid prescriptions. Patient is not currently taking opioid prescriptions. Functional ability and status Nutritional status Physical activity Advanced directives List of other physicians Hospitalizations, surgeries, and ER visits in previous 12 months Vitals Screenings to include cognitive, depression, and falls Referrals and appointments  In addition, I have reviewed and discussed with patient certain preventive protocols, quality metrics, and best practice recommendations. A written personalized care plan for preventive services as well as general preventive health recommendations were provided to patient.     Remi Haggard, LPN   03/18/863   After Visit Summary: (MyChart) Due to this being a telephonic visit, the after visit summary with patients personalized plan was offered to patient via MyChart   Nurse Notes:

## 2022-10-21 NOTE — Patient Instructions (Signed)
Matthew Simon , Thank you for taking time to come for your Medicare Wellness Visit. I appreciate your ongoing commitment to your health goals. Please review the following plan we discussed and let me know if I can assist you in the future.   Screening recommendations/referrals: Colonoscopy: no longer required Recommended yearly ophthalmology/optometry visit for glaucoma screening and checkup Recommended yearly dental visit for hygiene and checkup  Vaccinations: Influenza vaccine: up to date Pneumococcal vaccine: up to date Tdap vaccine: up to date Shingles vaccine: Education provided    Advanced directives: Education provided     Preventive Care 65 Years and Older, Male Preventive care refers to lifestyle choices and visits with your health care provider that can promote health and wellness. What does preventive care include? A yearly physical exam. This is also called an annual well check. Dental exams once or twice a year. Routine eye exams. Ask your health care provider how often you should have your eyes checked. Personal lifestyle choices, including: Daily care of your teeth and gums. Regular physical activity. Eating a healthy diet. Avoiding tobacco and drug use. Limiting alcohol use. Practicing safe sex. Taking low doses of aspirin every day. Taking vitamin and mineral supplements as recommended by your health care provider. What happens during an annual well check? The services and screenings done by your health care provider during your annual well check will depend on your age, overall health, lifestyle risk factors, and family history of disease. Counseling  Your health care provider may ask you questions about your: Alcohol use. Tobacco use. Drug use. Emotional well-being. Home and relationship well-being. Sexual activity. Eating habits. History of falls. Memory and ability to understand (cognition). Work and work Astronomer. Screening  You may have the  following tests or measurements: Height, weight, and BMI. Blood pressure. Lipid and cholesterol levels. These may be checked every 5 years, or more frequently if you are over 85 years old. Skin check. Lung cancer screening. You may have this screening every year starting at age 71 if you have a 30-pack-year history of smoking and currently smoke or have quit within the past 15 years. Fecal occult blood test (FOBT) of the stool. You may have this test every year starting at age 58. Flexible sigmoidoscopy or colonoscopy. You may have a sigmoidoscopy every 5 years or a colonoscopy every 10 years starting at age 62. Prostate cancer screening. Recommendations will vary depending on your family history and other risks. Hepatitis C blood test. Hepatitis B blood test. Sexually transmitted disease (STD) testing. Diabetes screening. This is done by checking your blood sugar (glucose) after you have not eaten for a while (fasting). You may have this done every 1-3 years. Abdominal aortic aneurysm (AAA) screening. You may need this if you are a current or former smoker. Osteoporosis. You may be screened starting at age 19 if you are at high risk. Talk with your health care provider about your test results, treatment options, and if necessary, the need for more tests. Vaccines  Your health care provider may recommend certain vaccines, such as: Influenza vaccine. This is recommended every year. Tetanus, diphtheria, and acellular pertussis (Tdap, Td) vaccine. You may need a Td booster every 10 years. Zoster vaccine. You may need this after age 44. Pneumococcal 13-valent conjugate (PCV13) vaccine. One dose is recommended after age 33. Pneumococcal polysaccharide (PPSV23) vaccine. One dose is recommended after age 85. Talk to your health care provider about which screenings and vaccines you need and how often you need them.  This information is not intended to replace advice given to you by your health care  provider. Make sure you discuss any questions you have with your health care provider. Document Released: 02/15/2015 Document Revised: 10/09/2015 Document Reviewed: 11/20/2014 Elsevier Interactive Patient Education  2017 [employer].  Fall Prevention in the Home Falls can cause injuries. They can happen to people of all ages. There are many things you can do to make your home safe and to help prevent falls. What can I do on the outside of my home? Regularly fix the edges of walkways and driveways and fix any cracks. Remove anything that might make you trip as you walk through a door, such as a raised step or threshold. Trim any bushes or trees on the path to your home. Use bright outdoor lighting. Clear any walking paths of anything that might make someone trip, such as rocks or tools. Regularly check to see if handrails are loose or broken. Make sure that both sides of any steps have handrails. Any raised decks and porches should have guardrails on the edges. Have any leaves, snow, or ice cleared regularly. Use sand or salt on walking paths during winter. Clean up any spills in your garage right away. This includes oil or grease spills. What can I do in the bathroom? Use night lights. Install grab bars by the toilet and in the tub and shower. Do not use towel bars as grab bars. Use non-skid mats or decals in the tub or shower. If you need to sit down in the shower, use a plastic, non-slip stool. Keep the floor dry. Clean up any water that spills on the floor as soon as it happens. Remove soap buildup in the tub or shower regularly. Attach bath mats securely with double-sided non-slip rug tape. Do not have throw rugs and other things on the floor that can make you trip. What can I do in the bedroom? Use night lights. Make sure that you have a light by your bed that is easy to reach. Do not use any sheets or blankets that are too big for your bed. They should not hang down onto the  floor. Have a firm chair that has side arms. You can use this for support while you get dressed. Do not have throw rugs and other things on the floor that can make you trip. What can I do in the kitchen? Clean up any spills right away. Avoid walking on wet floors. Keep items that you use a lot in easy-to-reach places. If you need to reach something above you, use a strong step stool that has a grab bar. Keep electrical cords out of the way. Do not use floor polish or wax that makes floors slippery. If you must use wax, use non-skid floor wax. Do not have throw rugs and other things on the floor that can make you trip. What can I do with my stairs? Do not leave any items on the stairs. Make sure that there are handrails on both sides of the stairs and use them. Fix handrails that are broken or loose. Make sure that handrails are as long as the stairways. Check any carpeting to make sure that it is firmly attached to the stairs. Fix any carpet that is loose or worn. Avoid having throw rugs at the top or bottom of the stairs. If you do have throw rugs, attach them to the floor with carpet tape. Make sure that you have a light switch at the  top of the stairs and the bottom of the stairs. If you do not have them, ask someone to add them for you. What else can I do to help prevent falls? Wear shoes that: Do not have high heels. Have rubber bottoms. Are comfortable and fit you well. Are closed at the toe. Do not wear sandals. If you use a stepladder: Make sure that it is fully opened. Do not climb a closed stepladder. Make sure that both sides of the stepladder are locked into place. Ask someone to hold it for you, if possible. Clearly mark and make sure that you can see: Any grab bars or handrails. First and last steps. Where the edge of each step is. Use tools that help you move around (mobility aids) if they are needed. These include: Canes. Walkers. Scooters. Crutches. Turn on the  lights when you go into a dark area. Replace any light bulbs as soon as they burn out. Set up your furniture so you have a clear path. Avoid moving your furniture around. If any of your floors are uneven, fix them. If there are any pets around you, be aware of where they are. Review your medicines with your doctor. Some medicines can make you feel dizzy. This can increase your chance of falling. Ask your doctor what other things that you can do to help prevent falls. This information is not intended to replace advice given to you by your health care provider. Make sure you discuss any questions you have with your health care provider. Document Released: 11/15/2008 Document Revised: 06/27/2015 Document Reviewed: 02/23/2014 Elsevier Interactive Patient Education  2017 [employer].

## 2022-10-27 NOTE — Progress Notes (Signed)
Subjective:   Matthew Simon is a 72 y.o. male who presents for Medicare Annual/Subsequent preventive examination.  Visit Complete: Virtual  I connected with  Matthew Simon on 10-21-2022 by a audio enabled telemedicine application and verified that I am speaking with the correct person using two identifiers.  Patient Location: Home  Provider Location: Home Office  I discussed the limitations of evaluation and management by telemedicine. The patient expressed understanding and agreed to proceed.  Vital Signs: Unable to obtain new vitals due to this being a telehealth visit.  Cardiac Risk Factors include: advanced age (>26men, >22 women);hypertension;male gender     Objective:    There were no vitals filed for this visit. There is no height or weight on file to calculate BMI.     10/21/2022    8:34 AM 05/31/2020   10:07 AM 08/10/2019    3:39 PM 08/10/2019   12:31 PM 05/18/2017    1:38 PM 05/12/2017    9:03 AM  Advanced Directives  Does Patient Have a Medical Advance Directive? No No No No No Yes  Type of Advance Directive      Living will  Would patient like information on creating a medical advance directive? No - Patient declined Yes (MAU/Ambulatory/Procedural Areas - Information given) No - Patient declined No - Patient declined No - Patient declined     Current Medications (verified) Outpatient Encounter Medications as of 10/21/2022  Medication Sig   allopurinol (ZYLOPRIM) 100 MG tablet Take 1 tablet (100 mg total) by mouth daily.   amLODipine (NORVASC) 5 MG tablet Take 1 tablet (5 mg total) by mouth daily.   doxazosin (CARDURA) 2 MG tablet Take 1 tablet (2 mg total) by mouth daily.   lisinopril (ZESTRIL) 20 MG tablet TAKE 1 TABLET BY MOUTH EVERY DAY   No facility-administered encounter medications on file as of 10/21/2022.    Allergies (verified) Patient has no known allergies.   History: Past Medical History:  Diagnosis Date   Gout    Hypertension    Past Surgical  History:  Procedure Laterality Date   ARTHROSCOPIC REPAIR ACL Left    apx July    COLONOSCOPY     EXCISION MASS NECK N/A 05/24/2017   Procedure: EXCISION POSTERIOR NECK MASS;  Surgeon: Darnell Level, MD;  Location: [facility] OR;  Service: General;  Laterality: N/A;   QUADRICEPS TENDON REPAIR Left 08/10/2019   Procedure: REPAIR QUADRICEP TENDON RUPTURE;  Surgeon: Cammy Copa, MD;  Location: [facility] OR;  Service: Orthopedics;  Laterality: Left;   WISDOM TOOTH EXTRACTION     Family History  Problem Relation Age of Onset   Cancer Mother        lymphoscarcoma   Stroke Father    Colon cancer Neg Hx    Rectal cancer Neg Hx    Social History   Socioeconomic History   Marital status: Married    Spouse name: Not on file   Number of children: Not on file   Years of education: Not on file   Highest education level: Not on file  Occupational History   Occupation: business development  Tobacco Use   Smoking status: Former    Current packs/day: 0.00    Types: Cigarettes    Quit date: 05/10/1964    Years since quitting: 58.5   Smokeless tobacco: Never  Vaping Use   Vaping status: Never Used  Substance and Sexual Activity   Alcohol use: Yes    Alcohol/week: 20.0 standard drinks of  alcohol    Types: 10 Glasses of wine, 10 Standard drinks or equivalent per week    Comment: wine with dinner   Drug use: No   Sexual activity: Not Currently  Other Topics Concern   Not on file  Social History Narrative   Not on file   Social Determinants of Health   Financial Resource Strain: Low Risk  (10/21/2022)   Overall Financial Resource Strain (CARDIA)    Difficulty of Paying Living Expenses: Not hard at all  Food Insecurity: No Food Insecurity (10/21/2022)   Hunger Vital Sign    Worried About Running Out of Food in the Last Year: Never true    Ran Out of Food in the Last Year: Never true  Transportation Needs: No Transportation Needs (10/21/2022)   PRAPARE - Administrator, Civil Service  (Medical): No    Lack of Transportation (Non-Medical): No  Physical Activity: Inactive (10/21/2022)   Exercise Vital Sign    Days of Exercise per Week: 0 days    Minutes of Exercise per Session: 0 min  Stress: No Stress Concern Present (10/21/2022)   [employer] of Occupational Health - Occupational Stress Questionnaire    Feeling of Stress : Not at all  Social Connections: Socially Integrated (10/21/2022)   Social Connection and Isolation Panel [NHANES]    Frequency of Communication with Friends and Family: Three times a week    Frequency of Social Gatherings with Friends and Family: Once a week    Attends Religious Services: 1 to 4 times per year    Active Member of [employer] or Organizations: Yes    Attends Engineer, structural: More than 4 times per year    Marital Status: Married    Tobacco Counseling Counseling given: Not Answered   Clinical Intake:  Pre-visit preparation completed: Yes        Diabetes: No  How often do you need to have someone help you when you read instructions, pamphlets, or other written materials from your doctor or pharmacy?: 1 - Never  Interpreter Needed?: No  Information entered by :: Matthew Haggard LPN   Activities of Daily Living    10/27/2022    1:01 PM 12/18/2021   12:10 PM  In your present state of health, do you have any difficulty performing the following activities:  Hearing? 0 0  Vision? 0 0  Difficulty concentrating or making decisions? 0 0  Walking or climbing stairs? 0 0  Dressing or bathing? 0 0  Doing errands, shopping? 0 0  Preparing Food and eating ? N N  Using the Toilet? N N  In the past six months, have you accidently leaked urine? N Y  Do you have problems with loss of bowel control? N N  Managing your Medications? N N  Managing your Finances? N N  Housekeeping or managing your Housekeeping? N N    Patient Care Team: Shade Flood, MD as PCP - General (Family Medicine)  Indicate any recent  Medical Services you may have received from other than [facility] providers in the past year (date may be approximate).     Assessment:   This is a routine wellness examination for Matthew Simon.  Hearing/Vision screen Hearing Screening - Comments:: No trouble hearing Vision Screening - Comments:: Up to date [facility]   Goals Addressed             This Visit's Progress    Patient Stated       Would  like to finish book he is writing       Depression Screen    10/21/2022    8:36 AM 06/01/2022    3:13 PM 12/18/2021   12:14 PM 12/08/2021   12:11 PM 05/31/2020    9:20 AM 11/20/2019    2:48 PM 01/04/2019    8:50 AM  PHQ 2/9 Scores  PHQ - 2 Score 0 1 0 1 0 0 0  PHQ- 9 Score 0 2 0 3       Fall Risk    10/21/2022    8:35 AM 06/01/2022    3:13 PM 12/18/2021   12:09 PM 12/08/2021   12:12 PM 05/31/2020    9:20 AM  Fall Risk   Falls in the past year? 0 0 0 0 0  Number falls in past yr: 0 0 0 0   Injury with Fall? 0 0 0 0   Risk for fall due to :  No Fall Risks  No Fall Risks   Follow up Falls evaluation completed;Education provided;Falls prevention discussed Falls evaluation completed Falls evaluation completed;Education provided;Falls prevention discussed Falls evaluation completed Falls evaluation completed    MEDICARE RISK AT HOME: Medicare Risk at Home Any stairs in or around the home?: Yes If so, are there any without handrails?: No Home free of loose throw rugs in walkways, pet beds, electrical cords, etc?: Yes Adequate lighting in your home to reduce risk of falls?: Yes Life alert?: No Use of a cane, walker or w/c?: No Grab bars in the bathroom?: No Shower chair or bench in shower?: No Elevated toilet seat or a handicapped toilet?: No  TIMED UP AND GO:  Was the test performed?  No    Cognitive Function:        10/21/2022    8:38 AM 12/18/2021   12:11 PM 05/31/2020    9:18 AM  6CIT Screen  What Year? 0 points 0 points 0 points  What month? 0 points 0 points 0  points  What time? 0 points 0 points 0 points  Count back from 20 0 points 0 points 0 points  Months in reverse 0 points 0 points 0 points  Repeat phrase 0 points 0 points 0 points  Total Score 0 points 0 points 0 points    Immunizations Immunization History  Administered Date(s) Administered   Fluad Quad(high Dose 65+) 11/20/2019   Influenza, Seasonal, Injecte, Preservative Fre 02/12/2012   Influenza,inj,Quad PF,6+ Mos 12/24/2014   PFIZER Comirnaty(Gray Top)Covid-19 Tri-Sucrose Vaccine 03/01/2019, 03/22/2019, 03/26/2020   Pneumococcal Conjugate-13 02/21/2015   Pneumococcal Polysaccharide-23 02/26/2010, 05/05/2016, 08/12/2019   Zoster Recombinant(Shingrix) 05/31/2020    TDAP status: Due, Education has been provided regarding the importance of this vaccine. Advised may receive this vaccine at local pharmacy or Health Dept. Aware to provide a copy of the vaccination record if obtained from local pharmacy or Health Dept. Verbalized acceptance and understanding.  Flu Vaccine status: Due, Education has been provided regarding the importance of this vaccine. Advised may receive this vaccine at local pharmacy or Health Dept. Aware to provide a copy of the vaccination record if obtained from local pharmacy or Health Dept. Verbalized acceptance and understanding.  Pneumococcal vaccine status: Up to date  Covid-19 vaccine status: Declined, Education has been provided regarding the importance of this vaccine but patient still declined. Advised may receive this vaccine at local pharmacy or Health Dept.or vaccine clinic. Aware to provide a copy of the vaccination record if obtained from local pharmacy or Health Dept.  Verbalized acceptance and understanding.  Qualifies for Shingles Vaccine? Yes   Zostavax completed No   Shingrix Completed?: No.    Education has been provided regarding the importance of this vaccine. Patient has been advised to call insurance company to determine out of pocket expense  if they have not yet received this vaccine. Advised may also receive vaccine at local pharmacy or Health Dept. Verbalized acceptance and understanding.  Screening Tests Health Maintenance  Topic Date Due   HEMOGLOBIN A1C  11/30/2020   FOOT EXAM  05/31/2021   COVID-19 Vaccine (4 - 2023-24 season) 11/06/2022 (Originally 10/04/2022)   Colonoscopy  11/20/2022 (Originally 05/12/2020)   INFLUENZA VACCINE  05/03/2023 (Originally 09/03/2022)   Zoster Vaccines- Shingrix (2 of 2) 01/20/2024 (Originally 07/26/2020)   OPHTHALMOLOGY EXAM  10/30/2022   Diabetic kidney evaluation - eGFR measurement  06/01/2023   Diabetic kidney evaluation - Urine ACR  06/01/2023   Medicare Annual Wellness (AWV)  10/21/2023   Pneumonia Vaccine 16+ Years old  Completed   Hepatitis C Screening  Completed   HPV VACCINES  Aged Out   DTaP/Tdap/Td  Discontinued    Health Maintenance  Health Maintenance Due  Topic Date Due   HEMOGLOBIN A1C  11/30/2020   FOOT EXAM  05/31/2021    Colorectal cancer screening: Referral to GI placed  . Pt aware the office will call re: appt.  Lung Cancer Screening: (Low Dose CT Chest recommended if Age 19-80 years, 20 pack-year currently smoking OR have quit w/in 15years.) does not qualify.   Lung Cancer Screening Referral:   Additional Screening:  Hepatitis C Screening: does not qualify; Completed 2017  Vision Screening: Recommended annual ophthalmology exams for early detection of glaucoma and other disorders of the eye. Is the patient up to date with their annual eye exam?  Yes  Who is the provider or what is the name of the office in which the patient attends annual eye exams? [facility] If pt is not established with a provider, would they like to be referred to a provider to establish care? No .   Dental Screening: Recommended annual dental exams for proper oral hygiene    Community Resource Referral / Chronic Care Management: CRR required this visit?  No   CCM required this  visit?  No     Plan:     I have personally reviewed and noted the following in the patient's chart:   Medical and social history Use of alcohol, tobacco or illicit drugs  Current medications and supplements including opioid prescriptions. Patient is not currently taking opioid prescriptions. Functional ability and status Nutritional status Physical activity Advanced directives List of other physicians Hospitalizations, surgeries, and ER visits in previous 12 months Vitals Screenings to include cognitive, depression, and falls Referrals and appointments  In addition, I have reviewed and discussed with patient certain preventive protocols, quality metrics, and best practice recommendations. A written personalized care plan for preventive services as well as general preventive health recommendations were provided to patient.     Matthew Haggard, LPN 05-29-621  After Visit Summary: (MyChart) Due to this being a telephonic visit, the after visit summary with patients personalized plan was offered to patient via MyChart   Nurse Notes:

## 2022-10-29 ENCOUNTER — Other Ambulatory Visit: Payer: Self-pay | Admitting: Family Medicine

## 2022-10-29 DIAGNOSIS — I10 Essential (primary) hypertension: Secondary | ICD-10-CM

## 2022-12-04 ENCOUNTER — Ambulatory Visit (INDEPENDENT_AMBULATORY_CARE_PROVIDER_SITE_OTHER): Payer: Medicare HMO | Admitting: Family Medicine

## 2022-12-04 ENCOUNTER — Encounter: Payer: Self-pay | Admitting: Gastroenterology

## 2022-12-04 ENCOUNTER — Encounter: Payer: Self-pay | Admitting: Family Medicine

## 2022-12-04 VITALS — BP 130/70 | HR 64 | Temp 97.8°F | Ht 70.0 in | Wt 185.8 lb

## 2022-12-04 DIAGNOSIS — R300 Dysuria: Secondary | ICD-10-CM

## 2022-12-04 DIAGNOSIS — M109 Gout, unspecified: Secondary | ICD-10-CM | POA: Diagnosis not present

## 2022-12-04 DIAGNOSIS — E785 Hyperlipidemia, unspecified: Secondary | ICD-10-CM | POA: Diagnosis not present

## 2022-12-04 DIAGNOSIS — Z125 Encounter for screening for malignant neoplasm of prostate: Secondary | ICD-10-CM

## 2022-12-04 DIAGNOSIS — Z23 Encounter for immunization: Secondary | ICD-10-CM

## 2022-12-04 DIAGNOSIS — Z0000 Encounter for general adult medical examination without abnormal findings: Secondary | ICD-10-CM

## 2022-12-04 DIAGNOSIS — I10 Essential (primary) hypertension: Secondary | ICD-10-CM

## 2022-12-04 DIAGNOSIS — R739 Hyperglycemia, unspecified: Secondary | ICD-10-CM

## 2022-12-04 DIAGNOSIS — R945 Abnormal results of liver function studies: Secondary | ICD-10-CM | POA: Diagnosis not present

## 2022-12-04 DIAGNOSIS — R7989 Other specified abnormal findings of blood chemistry: Secondary | ICD-10-CM

## 2022-12-04 LAB — LIPID PANEL
Cholesterol: 217 mg/dL — ABNORMAL HIGH (ref 0–200)
HDL: 80.9 mg/dL (ref >39.00)
LDL Cholesterol: 119 mg/dL — ABNORMAL HIGH (ref 0–99)
NonHDL: 135.91
Total CHOL/HDL Ratio: 3
Triglycerides: 85 mg/dL (ref 0.0–149.0)
VLDL: 17 mg/dL (ref 0.0–40.0)

## 2022-12-04 LAB — COMPREHENSIVE METABOLIC PANEL
ALT: 15 U/L (ref 0–53)
AST: 19 U/L (ref 0–37)
Albumin: 4.6 g/dL (ref 3.5–5.2)
Alkaline Phosphatase: 43 U/L (ref 39–117)
BUN: 15 mg/dL (ref 6–23)
CO2: 31 meq/L (ref 19–32)
Calcium: 9.8 mg/dL (ref 8.4–10.5)
Chloride: 103 meq/L (ref 96–112)
Creatinine, Ser: 1.22 mg/dL (ref 0.40–1.50)
GFR: 58.87 mL/min — ABNORMAL LOW (ref >60.00)
Glucose, Bld: 81 mg/dL (ref 70–99)
Potassium: 4.4 meq/L (ref 3.5–5.1)
Sodium: 141 meq/L (ref 135–145)
Total Bilirubin: 0.7 mg/dL (ref 0.2–1.2)
Total Protein: 7.2 g/dL (ref 6.0–8.3)

## 2022-12-04 LAB — CBC WITH DIFFERENTIAL/PLATELET
Basophils Absolute: 0 10*3/uL (ref 0.0–0.1)
Basophils Relative: 0.6 % (ref 0.0–3.0)
Eosinophils Absolute: 0.1 10*3/uL (ref 0.0–0.7)
Eosinophils Relative: 1.6 % (ref 0.0–5.0)
HCT: 43 % (ref 39.0–52.0)
Hemoglobin: 13.6 g/dL (ref 13.0–17.0)
Lymphocytes Relative: 30.8 % (ref 12.0–46.0)
Lymphs Abs: 1.5 10*3/uL (ref 0.7–4.0)
MCHC: 31.6 g/dL (ref 30.0–36.0)
MCV: 91.4 fL (ref 78.0–100.0)
Monocytes Absolute: 0.4 10*3/uL (ref 0.1–1.0)
Monocytes Relative: 7.4 % (ref 3.0–12.0)
Neutro Abs: 2.9 10*3/uL (ref 1.4–7.7)
Neutrophils Relative %: 59.6 % (ref 43.0–77.0)
Platelets: 290 10*3/uL (ref 150.0–400.0)
RBC: 4.71 Mil/uL (ref 4.22–5.81)
RDW: 13.1 % (ref 11.5–15.5)
WBC: 4.8 10*3/uL (ref 4.0–10.5)

## 2022-12-04 LAB — PSA, MEDICARE: PSA: 2.11 ng/mL (ref 0.10–4.00)

## 2022-12-04 LAB — HEMOGLOBIN A1C: Hgb A1c MFr Bld: 4.8 % (ref 4.6–6.5)

## 2022-12-04 MED ORDER — LISINOPRIL 20 MG PO TABS: 20.0000 mg | ORAL_TABLET | Freq: Every day | ORAL | 0 refills | Status: DC

## 2022-12-04 MED ORDER — DOXAZOSIN MESYLATE 2 MG PO TABS: 2.0000 mg | ORAL_TABLET | Freq: Every day | ORAL | 1 refills | Status: DC

## 2022-12-04 MED ORDER — AMLODIPINE BESYLATE 5 MG PO TABS: 5.0000 mg | ORAL_TABLET | Freq: Every day | ORAL | 1 refills | Status: DC

## 2022-12-04 MED ORDER — ALLOPURINOL 100 MG PO TABS: 100.0000 mg | ORAL_TABLET | Freq: Every day | ORAL | 1 refills | Status: DC

## 2022-12-04 NOTE — Patient Instructions (Addendum)
Call Dr. Lanetta Inch office to schedule colonoscopy - appears that was to be repeated in 2022.  Ileene Patrick, MD Address: [address], [place], [place] [address] Phone: [phone]  Covid and RSV vaccines options at your pharmacy.   I will check labs today including a liver tests and blood sugar as those levels were elevated previously.  No med changes for now.  If any concerns on labs I will let you know.  Take care.   There is a recommendation for RSV vaccine for patients over age 42.   Typically would recommend the RSV vaccine as most important for patients over age 92 that have comorbidities that put them at increased risk for severe disease (heart disease such as congestive heart failure, coronary artery disease, lung disease such as asthma or COPD, kidney disease, liver disease, diabetes, chronic or progressive neurologic or muscular conditions, immunosuppressed, or being frail or of advanced age).  For others that do not have these risk factors, there still is some benefit from vaccination since age is one of the main risk factors for developing severe disease however baseline risk of developing severe disease and requiring hospitalization is likely to be lower compared to those that have comorbidities in addition to age.   CDC does have some information as well: [employer]

## 2022-12-04 NOTE — Progress Notes (Unsigned)
Subjective:  Patient ID: Matthew Simon, male    DOB: May 02, 1950  Age: 72 y.o. MRN: [id number]  CC:  Chief Complaint  Patient presents with   Physical    HPI [name] presents for Annual Exam No health changes since last visit.  Semi - retired. Moved to smaller home. Doing well.    PCP: me Optometry, Dr. Simeon Craft, presbyopia.  Appointment in February. General Surgery, Dr. Gerrit Friends , umbilical hernia. Ortho - Dr. August Saucer- prior L knee surgery.   Hypertension: Last discussed in April.  Had been off meds at that time.  Previously treated with amlodipine, lisinopril, doxazosin.  Was having some difficulty initiating urination off meds.  Improved back on meds. No new med side effects. Previously home readings on medications were stable in the 120/70 range. Home readings: none.  BP Readings from Last 3 Encounters:  12/04/22 130/70  06/01/22 (!) 152/74  12/08/21 132/60   Lab Results  Component Value Date   CREATININE 1.11 06/01/2022   Gout: Last flare: none recent.  Daily meds: Allopurinol 100 mg daily, no new side effects.  Prn med: None Lab Results  Component Value Date   LABURIC 5.8 06/01/2022   Hyperlipidemia, elevated lfts, hyperglycemia.  No current statin.  Elevated LFTs in April.  Elevated blood sugar at that time as well (122).  6-week follow-up planned.  Has not been seen since April. No abd pain/n/v. No scleral icterus.  Few glasses wine per night.  No change in thirst/urination.   Lab Results  Component Value Date   CHOL 167 06/01/2022   HDL 64.40 06/01/2022   LDLCALC 86 06/01/2022   TRIG 83.0 06/01/2022   CHOLHDL 3 06/01/2022   Lab Results  Component Value Date   ALT 54 (H) 06/01/2022   AST 39 (H) 06/01/2022   ALKPHOS 38 (L) 06/01/2022   BILITOT 0.6 06/01/2022   Lab Results  Component Value Date   HGBA1C 4.8 05/31/2020        10/21/2022    8:36 AM 06/01/2022    3:13 PM 12/18/2021   12:14 PM 12/08/2021   12:11 PM 05/31/2020    9:20 AM   Depression screen PHQ 2/9  Decreased Interest 0 0 0 0 0  Down, Depressed, Hopeless 0 1 0 1 0  PHQ - 2 Score 0 1 0 1 0  Altered sleeping 0 0 0 1   Tired, decreased energy 0 1 0 0   Change in appetite 0 0 0 0   Feeling bad or failure about yourself  0 0 0 1   Trouble concentrating 0 0 0 0   Moving slowly or fidgety/restless 0 0 0 0   Suicidal thoughts 0 0 0 0   PHQ-9 Score 0 2 0 3   Difficult doing work/chores Not difficult at all Somewhat difficult       Health Maintenance  Topic Date Due   HEMOGLOBIN A1C  11/30/2020   FOOT EXAM  05/31/2021   COVID-19 Vaccine (4 - 2023-24 season) 12/20/2022 (Originally 10/04/2022)   Colonoscopy  02/02/2023 (Originally 05/12/2020)   Zoster Vaccines- Shingrix (2 of 2) 01/20/2024 (Originally 07/26/2020)   Diabetic kidney evaluation - eGFR measurement  06/01/2023   Diabetic kidney evaluation - Urine ACR  06/01/2023   OPHTHALMOLOGY EXAM  08/25/2023   Medicare Annual Wellness (AWV)  10/21/2023   Pneumonia Vaccine 43+ Years old  Completed   INFLUENZA VACCINE  Completed   Hepatitis C Screening  Completed   HPV VACCINES  Aged Out   DTaP/Tdap/Td  Discontinued  He is scheduling colonoscopy. Prior 05/2017 - Dr. Adela Lank, repeat recommended in 3 years.  Prostate: does not have family history of prostate cancer.  Some difficulty with emptying at times - suspected BPH, better with doxazosin. No hematuria.  The natural history of prostate cancer and ongoing controversy regarding screening and potential treatment outcomes of prostate cancer has been discussed with the patient. The meaning of a false positive PSA and a false negative PSA has been discussed. He indicates understanding of the limitations of this screening test and wishes to proceed with screening PSA testing.  Lab Results  Component Value Date   PSA1 1.6 11/23/2018   PSA1 1.3 03/21/2018   PSA1 1.5 05/05/2016   PSA 20.47 (H) 12/24/2014   PSA 1.09 02/12/2012      Immunization History   Administered Date(s) Administered   Fluad Quad(high Dose 65+) 11/20/2019   Fluad Trivalent(High Dose 65+) 12/04/2022   Influenza, Seasonal, Injecte, Preservative Fre 02/12/2012   Influenza,inj,Quad PF,6+ Mos 12/24/2014   PFIZER Comirnaty(Gray Top)Covid-19 Tri-Sucrose Vaccine 03/01/2019, 03/22/2019, 03/26/2020   Pneumococcal Conjugate-13 02/21/2015   Pneumococcal Polysaccharide-23 02/26/2010, 05/05/2016, 08/12/2019   Zoster Recombinant(Shingrix) 05/31/2020  Flu vaccine today Covid booster - recommended.  RSV vaccine - discussed.   Regular eye care as above.   Dental: cleaning earlier this year.   Alcohol: few glasses wine per night.   Tobacco: none.   Exercise: walking daily. Some tennis weekly.    History Patient Active Problem List   Diagnosis Date Noted   Rupture of left quadriceps tendon 08/09/2019   Soft tissue mass 05/22/2017   Gout 05/05/2016   Hypertension 04/10/2011   Colon polyp 04/10/2011   Past Medical History:  Diagnosis Date   Gout    Hypertension    Past Surgical History:  Procedure Laterality Date   ARTHROSCOPIC REPAIR ACL Left    apx July    COLONOSCOPY     EXCISION MASS NECK N/A 05/24/2017   Procedure: EXCISION POSTERIOR NECK MASS;  Surgeon: Darnell Level, MD;  Location: [facility] OR;  Service: General;  Laterality: N/A;   QUADRICEPS TENDON REPAIR Left 08/10/2019   Procedure: REPAIR QUADRICEP TENDON RUPTURE;  Surgeon: Cammy Copa, MD;  Location: [facility] OR;  Service: Orthopedics;  Laterality: Left;   WISDOM TOOTH EXTRACTION     No Known Allergies Prior to Admission medications   Medication Sig Start Date End Date Taking? Authorizing Provider  allopurinol (ZYLOPRIM) 100 MG tablet Take 1 tablet (100 mg total) by mouth daily. 06/01/22  Yes Shade Flood, MD  amLODipine (NORVASC) 5 MG tablet TAKE 1 TABLET (5 MG TOTAL) BY MOUTH DAILY. 10/29/22  Yes Shade Flood, MD  doxazosin (CARDURA) 2 MG tablet Take 1 tablet (2 mg total) by mouth daily. 06/01/22   Yes Shade Flood, MD  lisinopril (ZESTRIL) 20 MG tablet TAKE 1 TABLET BY MOUTH EVERY DAY 08/27/22  Yes Shade Flood, MD   Social History   Socioeconomic History   Marital status: Married    Spouse name: Not on file   Number of children: Not on file   Years of education: Not on file   Highest education level: Not on file  Occupational History   Occupation: business development  Tobacco Use   Smoking status: Former    Current packs/day: 0.00    Types: Cigarettes    Quit date: 05/10/1964    Years since quitting: 58.6   Smokeless tobacco: Never  Vaping Use  Vaping status: Never Used  Substance and Sexual Activity   Alcohol use: Yes    Alcohol/week: 20.0 standard drinks of alcohol    Types: 10 Glasses of wine, 10 Standard drinks or equivalent per week    Comment: wine with dinner   Drug use: No   Sexual activity: Not Currently  Other Topics Concern   Not on file  Social History Narrative   Not on file   Social Determinants of Health   Financial Resource Strain: Low Risk  (10/21/2022)   Overall Financial Resource Strain (CARDIA)    Difficulty of Paying Living Expenses: Not hard at all  Food Insecurity: No Food Insecurity (10/21/2022)   Hunger Vital Sign    Worried About Running Out of Food in the Last Year: Never true    Ran Out of Food in the Last Year: Never true  Transportation Needs: No Transportation Needs (10/21/2022)   PRAPARE - Administrator, Civil Service (Medical): No    Lack of Transportation (Non-Medical): No  Physical Activity: Inactive (10/21/2022)   Exercise Vital Sign    Days of Exercise per Week: 0 days    Minutes of Exercise per Session: 0 min  Stress: No Stress Concern Present (10/21/2022)   [employer] of Occupational Health - Occupational Stress Questionnaire    Feeling of Stress : Not at all  Social Connections: Socially Integrated (10/21/2022)   Social Connection and Isolation Panel [NHANES]    Frequency of Communication  with Friends and Family: Three times a week    Frequency of Social Gatherings with Friends and Family: Once a week    Attends Religious Services: 1 to 4 times per year    Active Member of [employer] or Organizations: Yes    Attends Engineer, structural: More than 4 times per year    Marital Status: Married  Catering manager Violence: Not At Risk (10/21/2022)   Humiliation, Afraid, Rape, and Kick questionnaire    Fear of Current or Ex-Partner: No    Emotionally Abused: No    Physically Abused: No    Sexually Abused: No    Review of Systems  13 point review of systems per patient health survey noted.  Negative other than as indicated above or in HPI.   Objective:   Vitals:   12/04/22 0941  BP: 130/70  Pulse: 64  Temp: 97.8 F (36.6 C)  TempSrc: Temporal  SpO2: 98%  Weight: 185 lb 12.8 oz (84.3 kg)  Height: 5\' 10"  (1.778 m)   {Vitals History (Optional):23777}  Physical Exam Vitals reviewed.  Constitutional:      Appearance: He is well-developed.  HENT:     Head: Normocephalic and atraumatic.     Right Ear: External ear normal.     Left Ear: External ear normal.  Eyes:     Conjunctiva/sclera: Conjunctivae normal.     Pupils: Pupils are equal, round, and reactive to light.  Neck:     Thyroid: No thyromegaly.     Vascular: No carotid bruit or JVD.  Cardiovascular:     Rate and Rhythm: Normal rate and regular rhythm.     Heart sounds: Normal heart sounds. No murmur heard. Pulmonary:     Effort: Pulmonary effort is normal. No respiratory distress.     Breath sounds: Normal breath sounds. No wheezing or rales.  Abdominal:     General: There is no distension.     Palpations: Abdomen is soft.     Tenderness: There is no  abdominal tenderness.  Musculoskeletal:        General: No tenderness. Normal range of motion.     Cervical back: Normal range of motion and neck supple.     Right lower leg: No edema.     Left lower leg: No edema.  Lymphadenopathy:     Cervical:  No cervical adenopathy.  Skin:    General: Skin is warm and dry.  Neurological:     Mental Status: He is alert and oriented to person, place, and time.     Deep Tendon Reflexes: Reflexes are normal and symmetric.  Psychiatric:        Mood and Affect: Mood normal.        Behavior: Behavior normal.      Assessment & Plan:  SHANARD TRETO is a 72 y.o. male . Annual physical exam  Essential hypertension - Plan: doxazosin (CARDURA) 2 MG tablet, CBC with Differential/Platelet, Comprehensive metabolic panel, amLODipine (NORVASC) 5 MG tablet, lisinopril (ZESTRIL) 20 MG tablet  Dysuria - Plan: doxazosin (CARDURA) 2 MG tablet  Need for influenza vaccination - Plan: Flu Vaccine Trivalent High Dose (Fluad)  Hyperlipidemia, unspecified hyperlipidemia type - Plan: Lipid panel  Hyperglycemia - Plan: Hemoglobin A1c  Elevated LFTs  Gout, unspecified cause, unspecified chronicity, unspecified site - Plan: allopurinol (ZYLOPRIM) 100 MG tablet  Screening for prostate cancer - Plan: PSA, Medicare ( [place] only)   Meds ordered this encounter  Medications   doxazosin (CARDURA) 2 MG tablet    Sig: Take 1 tablet (2 mg total) by mouth daily.    Dispense:  90 tablet    Refill:  1    DX Code Needed  .   allopurinol (ZYLOPRIM) 100 MG tablet    Sig: Take 1 tablet (100 mg total) by mouth daily.    Dispense:  90 tablet    Refill:  1   amLODipine (NORVASC) 5 MG tablet    Sig: Take 1 tablet (5 mg total) by mouth daily.    Dispense:  90 tablet    Refill:  1   lisinopril (ZESTRIL) 20 MG tablet    Sig: Take 1 tablet (20 mg total) by mouth daily.    Dispense:  90 tablet    Refill:  0   Patient Instructions  Call Dr. Lanetta Inch office to schedule colonoscopy - appears that was to be repeated in 2022.  Ileene Patrick, MD Address: [address], [place], [place] [address] Phone: [phone]  Covid and RSV vaccines options at your pharmacy.   I will check labs today  including a liver tests and blood sugar as those levels were elevated previously.  No med changes for now.  If any concerns on labs I will let you know.  Take care.   There is a recommendation for RSV vaccine for patients over age 34.   Typically would recommend the RSV vaccine as most important for patients over age 53 that have comorbidities that put them at increased risk for severe disease (heart disease such as congestive heart failure, coronary artery disease, lung disease such as asthma or COPD, kidney disease, liver disease, diabetes, chronic or progressive neurologic or muscular conditions, immunosuppressed, or being frail or of advanced age).  For others that do not have these risk factors, there still is some benefit from vaccination since age is one of the main risk factors for developing severe disease however baseline risk of developing severe disease and requiring hospitalization is likely to be lower  compared to those that have comorbidities in addition to age.   CDC does have some information as well: ToyProtection.fi     Signed,   Meredith Staggers, MD [facility], [facility] 12/04/22 10:48 AM

## 2022-12-08 ENCOUNTER — Telehealth: Payer: Self-pay | Admitting: Family Medicine

## 2022-12-08 NOTE — Telephone Encounter (Signed)
Called pt and explained that he receives his lab results first as a part of MyChart systems but that Dr Neva Seat would be making a comment about what they mean once he has had the chance to review them himself then a Medical Assistant would call and let him know about it and discuss any questions and patient stated that's not right and he wants the provider to call him stated I shouldn't be giving him his results a  physician should be and he doesn't appreciate reading something on a screen about them either. I apologized as this was standard practice and patient stated "we will see about that" and disconnected the call

## 2022-12-08 NOTE — Telephone Encounter (Signed)
Pt saw lab results in his chart but noticed there were not any notes. He has specifically asked for his provider to give him a call when he is back in the office on Friday. He would like to discuss these results so that he can make sure there is no misunderstanding.

## 2022-12-08 NOTE — Telephone Encounter (Signed)
Noted.  I did send him a MyChart message regarding his results today with option of phone call if needed.

## 2022-12-14 ENCOUNTER — Telehealth: Payer: Self-pay

## 2022-12-14 NOTE — Telephone Encounter (Signed)
Matthew Simon,   Good news, blood counts looked okay.  Electrolytes overall looked okay.  Kidney function test was borderline elevated but stable when compared to some of your previous readings.  I am not concerned with that level at this time.  Continue to stay hydrated, and try to avoid NSAIDs like Advil or Aleve.  Previous elevated liver tests are now normal.  Cholesterol levels were slightly higher than 6 months ago.  53-month blood sugar test was normal, stable from previous readings.  Prostate test was in the normal range.   I would consider a low-dose cholesterol medication based on current cholesterol levels unless you are able to make some significant dietary changes.  When calculating a 10-year heart disease risk score, your level was 33%.  Typically would recommend a cholesterol medication if that is over 8 to 10%.  We do have an option of a test called coronary calcium scoring.  Not sure if you are familiar with that test but it is about $100 out-of-pocket, and is a CT scan to look for calcium within the coronary arteries.  If that score is 0 or no signs of calcium then could hold on cholesterol medication.  If there is calcium, then that would indicate some plaque formation and then I would recommend a cholesterol med.  Let me know if you would like to pursue this test prior to deciding on a cholesterol medication or if there are questions.  Happy to call you to discuss this further if needed.  Thanks.   Dr. Neva Seat

## 2022-12-14 NOTE — Telephone Encounter (Signed)
-----   Message from Shade Flood sent at 12/13/2022  8:10 PM EST ----- Results sent by MyChart, but appears patient has not yet reviewed those results.  Please call and make sure he has seen note.  I am happy to call him if he would prefer to discuss these results over the phone.  Let me know.  Thanks

## 2022-12-15 NOTE — Telephone Encounter (Signed)
Pt informed of results.

## 2023-02-01 ENCOUNTER — Ambulatory Visit (AMBULATORY_SURGERY_CENTER): Payer: Medicare HMO

## 2023-02-01 VITALS — Ht 70.0 in | Wt 168.0 lb

## 2023-02-01 DIAGNOSIS — Z860100 Personal history of colon polyps, unspecified: Secondary | ICD-10-CM

## 2023-02-01 MED ORDER — NA SULFATE-K SULFATE-MG SULF 17.5-3.13-1.6 GM/177ML PO SOLN: 1.0000 | Freq: Once | ORAL | 0 refills | Status: AC

## 2023-02-01 NOTE — Progress Notes (Signed)

## 2023-02-08 ENCOUNTER — Telehealth: Payer: Self-pay

## 2023-02-08 NOTE — Telephone Encounter (Signed)
 Copied from CRM [id number]. Topic: Clinical - Medical Advice >> Feb 08, 2023 10:33 AM Viola FALCON wrote: Reason for CRM: Patient called regarding TDAP, MMR, PNUEMONIA, RSV AND TAKEAWAY - says he needs these shots since him and his wife are going to be new grandparents. Per CAL I was told that patient can go to his pharmacy for all accept the pneumonia shot (that could be done in office). I informed patient of this information but he wants a call back on what time frame he should be taking these shots? And wants a call to set up his pneumonia shot in the office.

## 2023-02-09 NOTE — Telephone Encounter (Signed)
 Pt has been notified.

## 2023-02-09 NOTE — Telephone Encounter (Signed)
 Immunization History  Administered Date(s) Administered   Fluad Quad(high Dose 65+) 11/20/2019   Fluad Trivalent(High Dose 65+) 12/04/2022   Influenza, Seasonal, Injecte, Preservative Fre 02/12/2012   Influenza,inj,Quad PF,6+ Mos 12/24/2014   PFIZER Comirnaty(Gray Top)Covid-19 Tri-Sucrose Vaccine 03/01/2019, 03/22/2019, 03/26/2020   Pneumococcal Conjugate-13 02/21/2015   Pneumococcal Polysaccharide-23 02/26/2010, 05/05/2016, 08/12/2019   Zoster Recombinant(Shingrix) 05/31/2020   His most recent pneumonia vaccine was in 2021.  Typically recommend a repeat pneumonia booster at 5 years, and at that time could give him the newer version Prevnar 20.   I do recommend Tdap, that can be given through his pharmacy as well as the RSV vaccine, should not be an issue with combining those at the same time. MMR should also be ok to give same day or can delay that for 1 month since it is a live virus vaccine.   Let me know if there are further questions.

## 2023-02-15 ENCOUNTER — Encounter: Payer: Medicare HMO | Admitting: Gastroenterology

## 2023-02-17 ENCOUNTER — Ambulatory Visit (INDEPENDENT_AMBULATORY_CARE_PROVIDER_SITE_OTHER): Payer: Medicare HMO

## 2023-02-17 ENCOUNTER — Telehealth: Payer: Self-pay

## 2023-02-17 DIAGNOSIS — Z23 Encounter for immunization: Secondary | ICD-10-CM | POA: Diagnosis not present

## 2023-02-17 DIAGNOSIS — E785 Hyperlipidemia, unspecified: Secondary | ICD-10-CM

## 2023-02-17 NOTE — Telephone Encounter (Signed)
 Patient had Prevnar 20 today but also needs Tdap RSV and 2nd shingles vaccine before grand baby arrives can you please advise what order or which he can get simultaneously

## 2023-02-17 NOTE — Progress Notes (Signed)
 Matthew Simon is a 73 y.o. male presents to the office today for Prevnar 20 immunization per physician's orders. Injection was administered Intramuscular Left deltoid.     Amalie Koran K Kenneith Stief

## 2023-02-17 NOTE — Telephone Encounter (Signed)
 Called patient, no answer, LM to call back if there are still questions

## 2023-02-17 NOTE — Telephone Encounter (Signed)
 This was discussed in part with most recent phone note, I have copied the notes from that encounter below:  "His most recent pneumonia vaccine was in 2021.  Typically recommend a repeat pneumonia booster at 5 years, and at that time could give him the newer version Prevnar 20.    I do recommend Tdap, that can be given through his pharmacy as well as the RSV vaccine, should not be an issue with combining those at the same time. MMR should also be ok to give same day or can delay that for 1 month since it is a live virus vaccine."  I see that he had Prevnar today anyway.   Should not be an issue with combining Tdap RSV and Shingrix, but second Shingrix can sometimes cause some side effects.  He may want to space that one out from multiple other vaccines by a few days or a week.   MMR can also be given as above or can be delayed for 1 month since it is the only live virus vaccine.  Let me know if there are further questions.

## 2023-02-18 MED ORDER — ATORVASTATIN CALCIUM 10 MG PO TABS: 10.0000 mg | ORAL_TABLET | Freq: Every day | ORAL | 0 refills | Status: DC

## 2023-02-18 NOTE — Telephone Encounter (Addendum)
Pt called back and discussed lab results as well as this recommendation for immunizations he is willing to try the low dose cholesterol med, please send to [facility]

## 2023-02-18 NOTE — Addendum Note (Signed)
Addended by: Meredith Staggers R on: 02/18/2023 04:47 PM   Modules accepted: Orders

## 2023-02-18 NOTE — Telephone Encounter (Signed)
Noted. Lipitor 10mg  ordered.  Pending labs for 6 weeks to check LFTs and lipid panel.  Please schedule lab visit for that time.

## 2023-02-19 NOTE — Telephone Encounter (Signed)
Patient has been informed lab appt made.

## 2023-02-26 NOTE — Telephone Encounter (Signed)
Copied from CRM [id number]. Topic: General - Other >> Feb 26, 2023  8:40 AM Kathryne Eriksson wrote: Reason for CRM: Vaccinations >> Feb 26, 2023  8:42 AM Kathryne Eriksson wrote: Patient called in having some concerns about his vaccination record, patient is requesting a call back from the nurse.

## 2023-03-03 ENCOUNTER — Encounter: Payer: Self-pay | Admitting: Gastroenterology

## 2023-03-10 ENCOUNTER — Ambulatory Visit: Payer: Medicare HMO | Admitting: Gastroenterology

## 2023-03-10 ENCOUNTER — Other Ambulatory Visit: Payer: Self-pay | Admitting: Gastroenterology

## 2023-03-10 ENCOUNTER — Encounter: Payer: Self-pay | Admitting: Gastroenterology

## 2023-03-10 VITALS — BP 168/88 | HR 51 | Temp 98.0°F | Resp 15 | Ht 70.0 in | Wt 168.0 lb

## 2023-03-10 DIAGNOSIS — M109 Gout, unspecified: Secondary | ICD-10-CM | POA: Diagnosis not present

## 2023-03-10 DIAGNOSIS — K5730 Diverticulosis of large intestine without perforation or abscess without bleeding: Secondary | ICD-10-CM

## 2023-03-10 DIAGNOSIS — K648 Other hemorrhoids: Secondary | ICD-10-CM | POA: Diagnosis not present

## 2023-03-10 DIAGNOSIS — Z1211 Encounter for screening for malignant neoplasm of colon: Secondary | ICD-10-CM | POA: Diagnosis not present

## 2023-03-10 DIAGNOSIS — D124 Benign neoplasm of descending colon: Secondary | ICD-10-CM

## 2023-03-10 DIAGNOSIS — I10 Essential (primary) hypertension: Secondary | ICD-10-CM | POA: Diagnosis not present

## 2023-03-10 DIAGNOSIS — Z860100 Personal history of colon polyps, unspecified: Secondary | ICD-10-CM

## 2023-03-10 MED ORDER — SODIUM CHLORIDE 0.9 % IV SOLN: 500.0000 mL | INTRAVENOUS | Status: DC

## 2023-03-10 NOTE — Progress Notes (Signed)
 Pt sedate, gd SR's, VSS, report to RN

## 2023-03-10 NOTE — Progress Notes (Signed)
 [facility] History and Physical   Primary Care Physician:  Levora Reyes SAUNDERS, MD   Reason for Procedure:   History of colon polyps  Plan:    colonoscopy     HPI: Matthew Simon is a 73 y.o. male  here for colonoscopy surveillance. Last exam 05/2017 - 6 small adenomas removed.   Patient denies any bowel symptoms at this time. No family history of colon cancer known. Otherwise feels well without any cardiopulmonary symptoms.   I have discussed risks / benefits of anesthesia and endoscopic procedure with Romyn P Kiraly and they wish to proceed with the exams as outlined today.    Past Medical History:  Diagnosis Date   Gout    Hypertension     Past Surgical History:  Procedure Laterality Date   ARTHROSCOPIC REPAIR ACL Left    apx July    COLONOSCOPY     EXCISION MASS NECK N/A 05/24/2017   Procedure: EXCISION POSTERIOR NECK MASS;  Surgeon: Eletha Boas, MD;  Location: [facility] OR;  Service: General;  Laterality: N/A;   QUADRICEPS TENDON REPAIR Left 08/10/2019   Procedure: REPAIR QUADRICEP TENDON RUPTURE;  Surgeon: Addie Cordella Hamilton, MD;  Location: [facility] OR;  Service: Orthopedics;  Laterality: Left;   WISDOM TOOTH EXTRACTION      Prior to Admission medications   Medication Sig Start Date End Date Taking? Authorizing Provider  allopurinol  (ZYLOPRIM ) 100 MG tablet Take 1 tablet (100 mg total) by mouth daily. 12/04/22  Yes Levora Reyes SAUNDERS, MD  amLODipine  (NORVASC ) 5 MG tablet Take 1 tablet (5 mg total) by mouth daily. 12/04/22  Yes Levora Reyes SAUNDERS, MD  atorvastatin  (LIPITOR) 10 MG tablet Take 1 tablet (10 mg total) by mouth daily. 02/18/23  Yes Levora Reyes SAUNDERS, MD  doxazosin  (CARDURA ) 2 MG tablet Take 1 tablet (2 mg total) by mouth daily. 12/04/22  Yes Levora Reyes SAUNDERS, MD  lisinopril  (ZESTRIL ) 20 MG tablet Take 1 tablet (20 mg total) by mouth daily. 12/04/22  Yes Levora Reyes SAUNDERS, MD    Current Outpatient Medications  Medication Sig Dispense Refill   allopurinol   (ZYLOPRIM ) 100 MG tablet Take 1 tablet (100 mg total) by mouth daily. 90 tablet 1   amLODipine  (NORVASC ) 5 MG tablet Take 1 tablet (5 mg total) by mouth daily. 90 tablet 1   atorvastatin  (LIPITOR) 10 MG tablet Take 1 tablet (10 mg total) by mouth daily. 90 tablet 0   doxazosin  (CARDURA ) 2 MG tablet Take 1 tablet (2 mg total) by mouth daily. 90 tablet 1   lisinopril  (ZESTRIL ) 20 MG tablet Take 1 tablet (20 mg total) by mouth daily. 90 tablet 0   Current Facility-Administered Medications  Medication Dose Route Frequency Provider Last Rate Last Admin   0.9 %  sodium chloride  infusion  500 mL Intravenous Continuous Jama Mcmiller, Elspeth SHAUNNA, MD        Allergies as of 03/10/2023   (No Known Allergies)    Family History  Problem Relation Age of Onset   Cancer Mother        lymphoscarcoma   Stroke Father    Colon cancer Neg Hx    Rectal cancer Neg Hx    Stomach cancer Neg Hx     Social History   Socioeconomic History   Marital status: Married    Spouse name: Not on file   Number of children: Not on file   Years of education: Not on file   Highest education level: Not on file  Occupational  History   Occupation: business development  Tobacco Use   Smoking status: Former    Current packs/day: 0.00    Types: Cigarettes    Quit date: 05/10/1964    Years since quitting: 58.8   Smokeless tobacco: Never  Vaping Use   Vaping status: Never Used  Substance and Sexual Activity   Alcohol use: Yes    Alcohol/week: 20.0 standard drinks of alcohol    Types: 10 Glasses of wine, 10 Standard drinks or equivalent per week    Comment: wine with dinner   Drug use: No   Sexual activity: Not Currently  Other Topics Concern   Not on file  Social History Narrative   Not on file   Social Drivers of Health   Financial Resource Strain: Low Risk  (10/21/2022)   Overall Financial Resource Strain (CARDIA)    Difficulty of Paying Living Expenses: Not hard at all  Food Insecurity: No Food Insecurity  (10/21/2022)   Hunger Vital Sign    Worried About Running Out of Food in the Last Year: Never true    Ran Out of Food in the Last Year: Never true  Transportation Needs: No Transportation Needs (10/21/2022)   PRAPARE - Administrator, Civil Service (Medical): No    Lack of Transportation (Non-Medical): No  Physical Activity: Inactive (10/21/2022)   Exercise Vital Sign    Days of Exercise per Week: 0 days    Minutes of Exercise per Session: 0 min  Stress: No Stress Concern Present (10/21/2022)   [employer] of Occupational Health - Occupational Stress Questionnaire    Feeling of Stress : Not at all  Social Connections: Socially Integrated (10/21/2022)   Social Connection and Isolation Panel [NHANES]    Frequency of Communication with Friends and Family: Three times a week    Frequency of Social Gatherings with Friends and Family: Once a week    Attends Religious Services: 1 to 4 times per year    Active Member of [employer] or Organizations: Yes    Attends Engineer, Structural: More than 4 times per year    Marital Status: Married  Catering Manager Violence: Not At Risk (10/21/2022)   Humiliation, Afraid, Rape, and Kick questionnaire    Fear of Current or Ex-Partner: No    Emotionally Abused: No    Physically Abused: No    Sexually Abused: No    Review of Systems: All other review of systems negative except as mentioned in the HPI.  Physical Exam: Vital signs BP (!) 154/92   Pulse 60   Temp 98 F (36.7 C) (Temporal)   Ht 5' 10 (1.778 m)   Wt 168 lb (76.2 kg)   SpO2 98%   BMI 24.11 kg/m   General:   Alert,  Well-developed, pleasant and cooperative in NAD Lungs:  Clear throughout to auscultation.   Heart:  Regular rate and rhythm Abdomen:  Soft, nontender and nondistended.   Neuro/Psych:  Alert and cooperative. Normal mood and affect. A and O x 3  Marcey Naval, MD [facility]

## 2023-03-10 NOTE — Op Note (Signed)
 [facility] Patient Name: Matthew Simon Procedure Date: 03/10/2023 10:46 AM MRN: [id number] Endoscopist: Elspeth P. Leigh , MD, [id number] Age: 73 Referring MD:  Date of Birth: [birth year] Gender: Male Account #: [id number] Procedure:                Colonoscopy Indications:              High risk colon cancer surveillance: Personal                            history of colonic polyps - 6 polyps removed 05/2017 Medicines:                Monitored Anesthesia Care Procedure:                Pre-Anesthesia Assessment:                           - Prior to the procedure, a History and Physical                            was performed, and patient medications and                            allergies were reviewed. The patient's tolerance of                            previous anesthesia was also reviewed. The risks                            and benefits of the procedure and the sedation                            options and risks were discussed with the patient.                            All questions were answered, and informed consent                            was obtained. Prior Anticoagulants: The patient has                            taken no anticoagulant or antiplatelet agents. ASA                            Grade Assessment: II - A patient with mild systemic                            disease. After reviewing the risks and benefits,                            the patient was deemed in satisfactory condition to                            undergo the procedure.  After obtaining informed consent, the colonoscope                            was passed under direct vision. Throughout the                            procedure, the patient's blood pressure, pulse, and                            oxygen saturations were monitored continuously. The                            Olympus Scope [id number] was introduced through the                            anus  and advanced to the the cecum, identified by                            appendiceal orifice and ileocecal valve. The                            colonoscopy was performed without difficulty. The                            patient tolerated the procedure well. The quality                            of the bowel preparation was adequate. The                            ileocecal valve, appendiceal orifice, and rectum                            were photographed. Scope In: 10:52:25 AM Scope Out: 11:10:48 AM Scope Withdrawal Time: 0 hours 15 minutes 47 seconds  Total Procedure Duration: 0 hours 18 minutes 23 seconds  Findings:                 The perianal and digital rectal examinations were                            normal.                           A 3 mm polyp was found in the descending colon. The                            polyp was sessile. The polyp was removed with a                            cold snare. Resection and retrieval were complete.                           Multiple small-mouthed diverticula were found in  the entire colon.                           Internal hemorrhoids were found during                            retroflexion. The hemorrhoids were small.                           The exam was otherwise without abnormality. Complications:            No immediate complications. Estimated blood loss:                            Minimal. Estimated Blood Loss:     Estimated blood loss was minimal. Impression:               - One 3 mm polyp in the descending colon, removed                            with a cold snare. Resected and retrieved.                           - Diverticulosis in the entire examined colon.                           - Internal hemorrhoids.                           - The examination was otherwise normal. Recommendation:           - Patient has a contact number available for                            emergencies. The signs and  symptoms of potential                            delayed complications were discussed with the                            patient. Return to normal activities tomorrow.                            Written discharge instructions were provided to the                            patient.                           - Resume previous diet.                           - Continue present medications.                           - Await pathology results. Elspeth P. Gaylynn Seiple, MD 03/10/2023 11:16:25 AM This report has been signed electronically.

## 2023-03-10 NOTE — Patient Instructions (Signed)
 Please read handouts provided. Continue present medications. Await pathology results.   YOU HAD AN ENDOSCOPIC PROCEDURE TODAY AT THE [facility]:   Refer to the procedure report that was given to you for any specific questions about what was found during the examination.  If the procedure report does not answer your questions, please call your gastroenterologist to clarify.  If you requested that your care partner not be given the details of your procedure findings, then the procedure report has been included in a sealed envelope for you to review at your convenience later.  YOU SHOULD EXPECT: Some feelings of bloating in the abdomen. Passage of more gas than usual.  Walking can help get rid of the air that was put into your GI tract during the procedure and reduce the bloating. If you had a lower endoscopy (such as a colonoscopy or flexible sigmoidoscopy) you may notice spotting of blood in your stool or on the toilet paper. If you underwent a bowel prep for your procedure, you may not have a normal bowel movement for a few days.  Please Note:  You might notice some irritation and congestion in your nose or some drainage.  This is from the oxygen used during your procedure.  There is no need for concern and it should clear up in a day or so.  SYMPTOMS TO REPORT IMMEDIATELY:  Following lower endoscopy (colonoscopy or flexible sigmoidoscopy):  Excessive amounts of blood in the stool  Significant tenderness or worsening of abdominal pains  Swelling of the abdomen that is new, acute  Fever of 100F or higher  For urgent or emergent issues, a gastroenterologist can be reached at any hour by calling [phone]. Do not use MyChart messaging for urgent concerns.    DIET:  We do recommend a small meal at first, but then you may proceed to your regular diet.  Drink plenty of fluids but you should avoid alcoholic beverages for 24 hours.  ACTIVITY:  You should plan to take it easy for  the rest of today and you should NOT DRIVE or use heavy machinery until tomorrow (because of the sedation medicines used during the test).    FOLLOW UP: Our staff will call the number listed on your records the next business day following your procedure.  We will call around 7:15- 8:00 am to check on you and address any questions or concerns that you may have regarding the information given to you following your procedure. If we do not reach you, we will leave a message.     If any biopsies were taken you will be contacted by phone or by letter within the next 1-3 weeks.  Please call us at [phone] if you have not heard about the biopsies in 3 weeks.    SIGNATURES/CONFIDENTIALITY: You and/or your care partner have signed paperwork which will be entered into your electronic medical record.  These signatures attest to the fact that that the information above on your After Visit Summary has been reviewed and is understood.  Full responsibility of the confidentiality of this discharge information lies with you and/or your care-partner.

## 2023-03-10 NOTE — Progress Notes (Signed)
 Called to room to assist during endoscopic procedure.  Patient ID and intended procedure confirmed with present staff. Received instructions for my participation in the procedure from the performing physician.

## 2023-03-11 ENCOUNTER — Telehealth: Payer: Self-pay

## 2023-03-11 NOTE — Telephone Encounter (Signed)
 Follow up call to pt, lm for pt to call if having any difficulty with normal activities or eating and drinking.  Also to call if any other questions or concerns.

## 2023-03-11 NOTE — Telephone Encounter (Signed)
 Inbound call from patient, states he is doing well after his procedure, has no concerns.

## 2023-03-12 LAB — SURGICAL PATHOLOGY

## 2023-03-14 ENCOUNTER — Encounter: Payer: Self-pay | Admitting: Gastroenterology

## 2023-03-19 ENCOUNTER — Encounter: Payer: Self-pay | Admitting: Gastroenterology

## 2023-03-29 ENCOUNTER — Other Ambulatory Visit (INDEPENDENT_AMBULATORY_CARE_PROVIDER_SITE_OTHER): Payer: Medicare HMO

## 2023-03-29 DIAGNOSIS — E785 Hyperlipidemia, unspecified: Secondary | ICD-10-CM | POA: Diagnosis not present

## 2023-03-29 LAB — LIPID PANEL
Cholesterol: 163 mg/dL (ref 0–200)
HDL: 80.6 mg/dL (ref >39.00)
LDL Cholesterol: 73 mg/dL (ref 0–99)
NonHDL: 82.36
Total CHOL/HDL Ratio: 2
Triglycerides: 48 mg/dL (ref 0.0–149.0)
VLDL: 9.6 mg/dL (ref 0.0–40.0)

## 2023-03-29 LAB — HEPATIC FUNCTION PANEL
ALT: 14 U/L (ref 0–53)
AST: 20 U/L (ref 0–37)
Albumin: 4.5 g/dL (ref 3.5–5.2)
Alkaline Phosphatase: 38 U/L — ABNORMAL LOW (ref 39–117)
Bilirubin, Direct: 0.1 mg/dL (ref 0.0–0.3)
Total Bilirubin: 0.6 mg/dL (ref 0.2–1.2)
Total Protein: 7.3 g/dL (ref 6.0–8.3)

## 2023-03-30 ENCOUNTER — Encounter: Payer: Self-pay | Admitting: Family Medicine

## 2023-04-01 ENCOUNTER — Telehealth: Payer: Self-pay

## 2023-04-01 NOTE — Telephone Encounter (Signed)
 Printed record from [employer] and from [facility] and both called patient to confirm Email address, notes the one on file is correct and stated I would fax once off the phone.  They have now been sent via Email and patient will keep an eye out for them and call if he does not receive.

## 2023-04-01 NOTE — Telephone Encounter (Signed)
 Copied from CRM [id number]. Topic: General - Other >> Apr 01, 2023 10:29 AM Almira Coaster wrote: Reason for CRM: Patient got a copy of their immunization record;however, deleted the email before he can print it out. He would like to know if it can possible be resent to him some time today.

## 2023-04-30 ENCOUNTER — Other Ambulatory Visit: Payer: Self-pay | Admitting: Family Medicine

## 2023-04-30 DIAGNOSIS — I10 Essential (primary) hypertension: Secondary | ICD-10-CM

## 2023-05-18 ENCOUNTER — Other Ambulatory Visit: Payer: Self-pay | Admitting: Family Medicine

## 2023-05-18 DIAGNOSIS — E785 Hyperlipidemia, unspecified: Secondary | ICD-10-CM

## 2023-06-03 ENCOUNTER — Ambulatory Visit: Payer: Medicare HMO | Admitting: Family Medicine

## 2023-06-07 ENCOUNTER — Encounter: Payer: Self-pay | Admitting: Family Medicine

## 2023-06-07 ENCOUNTER — Ambulatory Visit (INDEPENDENT_AMBULATORY_CARE_PROVIDER_SITE_OTHER): Admitting: Family Medicine

## 2023-06-07 VITALS — BP 134/76 | HR 62 | Temp 98.3°F | Ht 70.0 in | Wt 182.6 lb

## 2023-06-07 DIAGNOSIS — R300 Dysuria: Secondary | ICD-10-CM

## 2023-06-07 DIAGNOSIS — E785 Hyperlipidemia, unspecified: Secondary | ICD-10-CM

## 2023-06-07 DIAGNOSIS — M109 Gout, unspecified: Secondary | ICD-10-CM

## 2023-06-07 DIAGNOSIS — Z87898 Personal history of other specified conditions: Secondary | ICD-10-CM

## 2023-06-07 DIAGNOSIS — I10 Essential (primary) hypertension: Secondary | ICD-10-CM | POA: Diagnosis not present

## 2023-06-07 MED ORDER — ALLOPURINOL 100 MG PO TABS: 100.0000 mg | ORAL_TABLET | Freq: Every day | ORAL | 1 refills | Status: DC

## 2023-06-07 MED ORDER — AMLODIPINE BESYLATE 5 MG PO TABS: 5.0000 mg | ORAL_TABLET | Freq: Every day | ORAL | 1 refills | Status: DC

## 2023-06-07 MED ORDER — LISINOPRIL 20 MG PO TABS
20.0000 mg | ORAL_TABLET | Freq: Every day | ORAL | 1 refills | Status: DC
Start: 2023-06-07 — End: 2023-11-30

## 2023-06-07 MED ORDER — DOXAZOSIN MESYLATE 2 MG PO TABS: 2.0000 mg | ORAL_TABLET | Freq: Every day | ORAL | 1 refills | Status: DC

## 2023-06-07 NOTE — Progress Notes (Signed)
 Subjective:  Patient ID: Matthew Simon, male    DOB: October 19, 1950  Age: 73 y.o. MRN: [id number]  CC:  Chief Complaint  Patient presents with   Medical Management of Chronic Issues    HPI Gevorg P Farney presents for   Hypertension: Treated with amlodipine , lisinopril , doxazosin .  Improved last visit back on meds. Still on same meds.  Home readings: none.  BP Readings from Last 3 Encounters:  06/07/23 134/76  03/10/23 (!) 168/88  12/04/22 130/70   Lab Results  Component Value Date   CREATININE 1.22 12/04/2022   Gout: Last flare: None recent. Daily meds: Allopurinol  100 mg daily without new side effects. Prn med: None needed. Lab Results  Component Value Date   LABURIC 5.8 06/01/2022   Hyperlipidemia: Lipitor 10mg  every day for about 3 months - no myalgias or side effects. Ran out about a month ago - did not pick up refill yet. Prior elevated LFTs resolved. Plan for lab visit in 6 weeks.  Lab Results  Component Value Date   CHOL 163 03/29/2023   HDL 80.60 03/29/2023   LDLCALC 73 03/29/2023   TRIG 48.0 03/29/2023   CHOLHDL 2 03/29/2023   Lab Results  Component Value Date   ALT 14 03/29/2023   AST 20 03/29/2023   ALKPHOS 38 (L) 03/29/2023   BILITOT 0.6 03/29/2023   Health maintenance: Updated A1c on HM. No known hx of diabetes. Prior prediabetes? On chart review it appears he was diagnosed with prediabetes only and on metformin  in 2015, 2017 - not recent.  A1c on my review has never been above threshold for diabetes that I can see. A1c 5.0 on 05/11/11.   4.8 on 02/12/12 4.7 on 09/02/12.  Other A1c since that time - 4.9, 4.7, 5.0 (2018), 4.8 (2021), 4.8 (2022), 4.8 (12/2022).  Off metformin  for some time. Appears he has history of prediabetes, not diabetes. Will check A1c again today to verify.     History Patient Active Problem List   Diagnosis Date Noted   Rupture of left quadriceps tendon 08/09/2019   Soft tissue mass 05/22/2017   Gout 05/05/2016    Hypertension 04/10/2011   Colon polyp 04/10/2011   Past Medical History:  Diagnosis Date   Gout    Hypertension    Past Surgical History:  Procedure Laterality Date   ARTHROSCOPIC REPAIR ACL Left    apx July    COLONOSCOPY     EXCISION MASS NECK N/A 05/24/2017   Procedure: EXCISION POSTERIOR NECK MASS;  Surgeon: Oralee Billow, MD;  Location: [facility] OR;  Service: General;  Laterality: N/A;   QUADRICEPS TENDON REPAIR Left 08/10/2019   Procedure: REPAIR QUADRICEP TENDON RUPTURE;  Surgeon: Jasmine Mesi, MD;  Location: [facility] OR;  Service: Orthopedics;  Laterality: Left;   WISDOM TOOTH EXTRACTION     No Known Allergies Prior to Admission medications   Medication Sig Start Date End Date Taking? Authorizing Provider  allopurinol  (ZYLOPRIM ) 100 MG tablet Take 1 tablet (100 mg total) by mouth daily. 12/04/22   Benjiman Bras, MD  amLODipine  (NORVASC ) 5 MG tablet Take 1 tablet (5 mg total) by mouth daily. 12/04/22   Benjiman Bras, MD  atorvastatin  (LIPITOR) 10 MG tablet TAKE 1 TABLET BY MOUTH EVERY DAY 05/18/23   Benjiman Bras, MD  doxazosin  (CARDURA ) 2 MG tablet Take 1 tablet (2 mg total) by mouth daily. 12/04/22   Benjiman Bras, MD  lisinopril  (ZESTRIL ) 20 MG tablet TAKE 1 TABLET BY MOUTH  EVERY DAY 04/30/23   Benjiman Bras, MD   Social History   Socioeconomic History   Marital status: Married    Spouse name: Not on file   Number of children: Not on file   Years of education: Not on file   Highest education level: Not on file  Occupational History   Occupation: business development  Tobacco Use   Smoking status: Former    Current packs/day: 0.00    Types: Cigarettes    Quit date: 05/10/1964    Years since quitting: 59.1   Smokeless tobacco: Never  Vaping Use   Vaping status: Never Used  Substance and Sexual Activity   Alcohol use: Yes    Alcohol/week: 20.0 standard drinks of alcohol    Types: 10 Glasses of wine, 10 Standard drinks or equivalent per week    Comment:  wine with dinner   Drug use: No   Sexual activity: Not Currently  Other Topics Concern   Not on file  Social History Narrative   Not on file   Social Drivers of Health   Financial Resource Strain: Low Risk  (10/21/2022)   Overall Financial Resource Strain (CARDIA)    Difficulty of Paying Living Expenses: Not hard at all  Food Insecurity: No Food Insecurity (10/21/2022)   Hunger Vital Sign    Worried About Running Out of Food in the Last Year: Never true    Ran Out of Food in the Last Year: Never true  Transportation Needs: No Transportation Needs (10/21/2022)   PRAPARE - Administrator, Civil Service (Medical): No    Lack of Transportation (Non-Medical): No  Physical Activity: Inactive (10/21/2022)   Exercise Vital Sign    Days of Exercise per Week: 0 days    Minutes of Exercise per Session: 0 min  Stress: No Stress Concern Present (10/21/2022)   [employer] of Occupational Health - Occupational Stress Questionnaire    Feeling of Stress : Not at all  Social Connections: Socially Integrated (10/21/2022)   Social Connection and Isolation Panel [NHANES]    Frequency of Communication with Friends and Family: Three times a week    Frequency of Social Gatherings with Friends and Family: Once a week    Attends Religious Services: 1 to 4 times per year    Active Member of [employer] or Organizations: Yes    Attends Engineer, structural: More than 4 times per year    Marital Status: Married  Catering manager Violence: Not At Risk (10/21/2022)   Humiliation, Afraid, Rape, and Kick questionnaire    Fear of Current or Ex-Partner: No    Emotionally Abused: No    Physically Abused: No    Sexually Abused: No    Review of Systems  Constitutional:  Negative for fatigue and unexpected weight change.  Eyes:  Negative for visual disturbance.  Respiratory:  Negative for cough, chest tightness and shortness of breath.   Cardiovascular:  Negative for chest pain, palpitations  and leg swelling.       Sore in upper chest muscle few days ago, twinge, in muscle of chest, resolved. No recurrence, no CP with exertion. Walking for exercise, back into tennis once per week.   Gastrointestinal:  Negative for abdominal pain and blood in stool.  Neurological:  Negative for dizziness, light-headedness and headaches.     Objective:   Vitals:   06/07/23 0857  BP: 134/76  Pulse: 62  Temp: 98.3 F (36.8 C)  TempSrc: Temporal  SpO2: 98%  Weight: 182 lb 9.6 oz (82.8 kg)  Height: 5\' 10"  (1.778 m)     Physical Exam Vitals reviewed.  Constitutional:      Appearance: He is well-developed.  HENT:     Head: Normocephalic and atraumatic.  Neck:     Vascular: No carotid bruit or JVD.  Cardiovascular:     Rate and Rhythm: Normal rate and regular rhythm.     Heart sounds: Normal heart sounds. No murmur heard. Pulmonary:     Effort: Pulmonary effort is normal.     Breath sounds: Normal breath sounds. No rales.  Musculoskeletal:     Right lower leg: No edema.     Left lower leg: No edema.  Skin:    General: Skin is warm and dry.  Neurological:     Mental Status: He is alert and oriented to person, place, and time.  Psychiatric:        Mood and Affect: Mood normal.    Assessment & Plan:  RONAL KALAL is a 73 y.o. male . Essential hypertension - Plan: Comprehensive metabolic panel with GFR  - Tolerating current med regimen, continue same, lab visit in 6 weeks.  Gout, unspecified cause, unspecified chronicity, unspecified site  - Stable without recent flare, continue allopurinol .  Hyperlipidemia, unspecified hyperlipidemia type - Plan: Comprehensive metabolic panel with GFR, Lipid panel  - Tolerating Lipitor without side effects but off meds for the past month.  Will restart Lipitor, refill at pharmacy.  Lab visit in 6 weeks, and then can adjust plan accordingly.  History of prediabetes - Plan: Hemoglobin A1c  - On my chart review through the EMR, I do not see  any definitive sign of diabetes only history of prediabetes that appears to have been treated with metformin .  His A1c has not been above 5.7 that I can see going back to 2013.  I did complete a letter stating that I do not see any sign of diabetes and only reported history of prediabetes treated with metformin , and off that med for years with normal A1c.  Will add A1c for his upcoming 6-week lab visit.  No orders of the defined types were placed in this encounter.  Patient Instructions  Thanks for coming in today.  Restart atorvastatin  once per day and lab visit in 6 weeks.  I will check the prediabetes test at that time but on my chart review today I do not see any sign of diabetes.  I suspect you had a history of prediabetes, but the 8-month blood sugar test have been in the normal range as far back as I can see in the electronic medical record.  I have completed a letter for you today to provide to your insurance if needed but let me know if further information requested.  No change in blood pressure medications at this time.  Thanks for coming in today and take care!    Signed,   Caro Christmas, MD [facility], [facility] 06/07/23 9:44 AM

## 2023-06-07 NOTE — Patient Instructions (Signed)
 Thanks for coming in today.  Restart atorvastatin  once per day and lab visit in 6 weeks.  I will check the prediabetes test at that time but on my chart review today I do not see any sign of diabetes.  I suspect you had a history of prediabetes, but the 98-month blood sugar test have been in the normal range as far back as I can see in the electronic medical record.  I have completed a letter for you today to provide to your insurance if needed but let me know if further information requested.  No change in blood pressure medications at this time.  Thanks for coming in today and take care!

## 2023-06-09 ENCOUNTER — Telehealth: Payer: Self-pay | Admitting: Family Medicine

## 2023-06-09 NOTE — Telephone Encounter (Signed)
 Called and verified with [facility] that prescription is at the pharmacy and they will have it ready this evening.  Patient called, left VM to return the call to the office.   Copied from CRM [id number]. Topic: Clinical - Prescription Issue >> Jun 09, 2023  1:28 PM Kita Perish H wrote: Reason for CRM: Patient states he was supposed to have a prescription called in for the atorvastatin  (LIPITOR) 10 MG tablets on 5/5 after his visit with Dr. Ester Helms. Medication shows discontinued by provider on 4/15, please reach out with some clarity, states he was supposed to start taking medication yesterday.  Jermie [phone]

## 2023-07-08 ENCOUNTER — Encounter: Payer: Self-pay | Admitting: Pharmacist

## 2023-07-08 NOTE — Progress Notes (Signed)
 Pharmacy Quality Measure Review  This patient is appearing on a report for being at risk of failing the adherence measure for cholesterol (statin) and hypertension (ACEi/ARB) medications this calendar year.   Medication: lisinopril  20mg  Last fill date: 02/03/2023 for 90 day supply per adherence report but patient has filled lisinopril  on 06/09/2022 fro 90 days and [facility]   Medication: atorvastatin  10mg   Last fill date: 02/18/2023 for 90 day supply Per adherence report but patient has filled lisinopril  on 06/09/2022 fro 90 days and [facility]  Insurance report was not up to date. No action needed at this time.   Cecilie Coffee, PharmD Clinical Pharmacist [facility]  Population Health [phone]

## 2023-07-19 ENCOUNTER — Other Ambulatory Visit (INDEPENDENT_AMBULATORY_CARE_PROVIDER_SITE_OTHER)

## 2023-07-19 DIAGNOSIS — Z87898 Personal history of other specified conditions: Secondary | ICD-10-CM | POA: Diagnosis not present

## 2023-07-19 DIAGNOSIS — Z8639 Personal history of other endocrine, nutritional and metabolic disease: Secondary | ICD-10-CM | POA: Diagnosis not present

## 2023-07-19 DIAGNOSIS — I10 Essential (primary) hypertension: Secondary | ICD-10-CM | POA: Diagnosis not present

## 2023-07-19 DIAGNOSIS — E785 Hyperlipidemia, unspecified: Secondary | ICD-10-CM | POA: Diagnosis not present

## 2023-07-19 LAB — LIPID PANEL
Cholesterol: 151 mg/dL (ref 0–200)
HDL: 67.8 mg/dL (ref >39.00)
LDL Cholesterol: 71 mg/dL (ref 0–99)
NonHDL: 83.07
Total CHOL/HDL Ratio: 2
Triglycerides: 59 mg/dL (ref 0.0–149.0)
VLDL: 11.8 mg/dL (ref 0.0–40.0)

## 2023-07-19 LAB — COMPREHENSIVE METABOLIC PANEL WITH GFR
ALT: 22 U/L (ref 0–53)
AST: 27 U/L (ref 0–37)
Albumin: 4.4 g/dL (ref 3.5–5.2)
Alkaline Phosphatase: 41 U/L (ref 39–117)
BUN: 14 mg/dL (ref 6–23)
CO2: 28 meq/L (ref 19–32)
Calcium: 9.1 mg/dL (ref 8.4–10.5)
Chloride: 106 meq/L (ref 96–112)
Creatinine, Ser: 1.09 mg/dL (ref 0.40–1.50)
GFR: 67.1 mL/min (ref >60.00)
Glucose, Bld: 121 mg/dL — ABNORMAL HIGH (ref 70–99)
Potassium: 3.7 meq/L (ref 3.5–5.1)
Sodium: 140 meq/L (ref 135–145)
Total Bilirubin: 0.6 mg/dL (ref 0.2–1.2)
Total Protein: 7 g/dL (ref 6.0–8.3)

## 2023-07-19 LAB — HEMOGLOBIN A1C: Hgb A1c MFr Bld: 4.8 % (ref 4.6–6.5)

## 2023-07-20 ENCOUNTER — Ambulatory Visit: Payer: Self-pay | Admitting: Family Medicine

## 2023-07-26 NOTE — Telephone Encounter (Signed)
 Lab results have been discussed.   Verbalized understanding? Yes  Are there any questions? No

## 2023-08-20 ENCOUNTER — Telehealth: Payer: Self-pay

## 2023-08-20 NOTE — Telephone Encounter (Signed)
 Recent lipid panel was stable, but if he was taking medicine at that time, levels will increase if he stops taking meds.   If he is having side effects or other concerns with medication it may be best that we discuss at an office visit, virtual visit is fine.

## 2023-08-20 NOTE — Telephone Encounter (Signed)
 Called patient to get more information. Patient tried medication and he is wondering does he need to stop medication or keep taking it?   Copied from CRM [id number]. Topic: Clinical - Medication Question >> Aug 20, 2023 11:37 AM Rea C wrote: Reason for CRM: Patient would like to speak with Dr. Valorie nurse in regards to the statin that he is taking and wants to know if he can be taken off of it. atorvastatin  (LIPITOR) 10 MG tablet.   Patients contact is [phone].

## 2023-08-20 NOTE — Telephone Encounter (Signed)
 Called patient and scheduled patient with a virtual visit to discuss these concerns with statin medication.

## 2023-08-25 ENCOUNTER — Encounter: Payer: Self-pay | Admitting: Family Medicine

## 2023-08-25 ENCOUNTER — Telehealth: Admitting: Family Medicine

## 2023-08-25 VITALS — Wt 170.0 lb

## 2023-08-25 DIAGNOSIS — E785 Hyperlipidemia, unspecified: Secondary | ICD-10-CM

## 2023-08-25 NOTE — Progress Notes (Signed)
 Virtual Visit via Video Note  I connected with Matthew Simon on 08/25/23 at 4:10 PM by a video enabled telemedicine application and verified that I am speaking with the correct person using two identifiers.  Patient location: home. By self.  My location: office - [place].    I discussed the limitations, risks, security and privacy concerns of performing an evaluation and management service by telephone and the availability of in person appointments. I also discussed with the patient that there may be a patient responsible charge related to this service. The patient expressed understanding and agreed to proceed, consent obtained  Chief complaint:  Chief Complaint  Patient presents with   Results    Pt notes just curious about medication regimen    History of Present Illness: Matthew Simon is a 73 y.o. male  Hyperlipidemia: Treated with Lipitor 10 mg daily.  Most recent labs with LDL 71.  No changes in meds. Had been taking atorvastatin  daily.  No myalgias or side effects.  Only memory concern - has occasional delay on recall of friends.  No other memory changes.  We discussed reasons for continued statin, and options including coronary calcium  scoring as options if he would like.    Lab Results  Component Value Date   CHOL 151 07/19/2023   HDL 67.80 07/19/2023   LDLCALC 71 07/19/2023   TRIG 59.0 07/19/2023   CHOLHDL 2 07/19/2023   Lab Results  Component Value Date   ALT 22 07/19/2023   AST 27 07/19/2023   ALKPHOS 41 07/19/2023   BILITOT 0.6 07/19/2023       Patient Active Problem List   Diagnosis Date Noted   Rupture of left quadriceps tendon 08/09/2019   Soft tissue mass 05/22/2017   Gout 05/05/2016   Hypertension 04/10/2011   Colon polyp 04/10/2011   Past Medical History:  Diagnosis Date   Gout    Hypertension    Past Surgical History:  Procedure Laterality Date   ARTHROSCOPIC REPAIR ACL Left    apx July    COLONOSCOPY     EXCISION MASS  NECK N/A 05/24/2017   Procedure: EXCISION POSTERIOR NECK MASS;  Surgeon: Eletha Boas, MD;  Location: [facility] OR;  Service: General;  Laterality: N/A;   QUADRICEPS TENDON REPAIR Left 08/10/2019   Procedure: REPAIR QUADRICEP TENDON RUPTURE;  Surgeon: Addie Cordella Hamilton, MD;  Location: [facility] OR;  Service: Orthopedics;  Laterality: Left;   WISDOM TOOTH EXTRACTION     No Known Allergies Prior to Admission medications   Medication Sig Start Date End Date Taking? Authorizing Provider  allopurinol  (ZYLOPRIM ) 100 MG tablet Take 1 tablet (100 mg total) by mouth daily. 06/07/23  Yes Levora Reyes SAUNDERS, MD  amLODipine  (NORVASC ) 5 MG tablet Take 1 tablet (5 mg total) by mouth daily. 06/07/23  Yes Levora Reyes SAUNDERS, MD  atorvastatin  (LIPITOR) 10 MG tablet TAKE 1 TABLET BY MOUTH EVERY DAY 05/18/23  Yes Levora Reyes SAUNDERS, MD  doxazosin  (CARDURA ) 2 MG tablet Take 1 tablet (2 mg total) by mouth daily. 06/07/23  Yes Levora Reyes SAUNDERS, MD  lisinopril  (ZESTRIL ) 20 MG tablet Take 1 tablet (20 mg total) by mouth daily. 06/07/23  Yes Levora Reyes SAUNDERS, MD   Social History   Socioeconomic History   Marital status: Married    Spouse name: Not on file   Number of children: Not on file   Years of education: Not on file   Highest education level: Not on file  Occupational History  Occupation: business development  Tobacco Use   Smoking status: Former    Current packs/day: 0.00    Types: Cigarettes    Quit date: 05/10/1964    Years since quitting: 59.3   Smokeless tobacco: Never  Vaping Use   Vaping status: Never Used  Substance and Sexual Activity   Alcohol use: Yes    Alcohol/week: 20.0 standard drinks of alcohol    Types: 10 Glasses of wine, 10 Standard drinks or equivalent per week    Comment: wine with dinner   Drug use: No   Sexual activity: Not Currently  Other Topics Concern   Not on file  Social History Narrative   Not on file   Social Drivers of Health   Financial Resource Strain: Low Risk  (10/21/2022)    Overall Financial Resource Strain (CARDIA)    Difficulty of Paying Living Expenses: Not hard at all  Food Insecurity: No Food Insecurity (10/21/2022)   Hunger Vital Sign    Worried About Running Out of Food in the Last Year: Never true    Ran Out of Food in the Last Year: Never true  Transportation Needs: No Transportation Needs (10/21/2022)   PRAPARE - Administrator, Civil Service (Medical): No    Lack of Transportation (Non-Medical): No  Physical Activity: Inactive (10/21/2022)   Exercise Vital Sign    Days of Exercise per Week: 0 days    Minutes of Exercise per Session: 0 min  Stress: No Stress Concern Present (10/21/2022)   [employer] of Occupational Health - Occupational Stress Questionnaire    Feeling of Stress : Not at all  Social Connections: Socially Integrated (10/21/2022)   Social Connection and Isolation Panel    Frequency of Communication with Friends and Family: Three times a week    Frequency of Social Gatherings with Friends and Family: Once a week    Attends Religious Services: 1 to 4 times per year    Active Member of [employer] or Organizations: Yes    Attends Engineer, structural: More than 4 times per year    Marital Status: Married  Catering manager Violence: Not At Risk (10/21/2022)   Humiliation, Afraid, Rape, and Kick questionnaire    Fear of Current or Ex-Partner: No    Emotionally Abused: No    Physically Abused: No    Sexually Abused: No    Observations/Objective: Nontoxic appearance on video, speaking full sentences without distress.  Appropriate responses.  All questions were answered  Assessment and Plan: Hyperlipidemia, unspecified hyperlipidemia type Tolerating current dose of Lipitor.  Discussed typical brain/memory changes with age and no concerns with like he has discussed today.  RTC precautions were given.  We discussed rationale for continuing Lipitor for management of hyperlipidemia at this time, but also discussed trial  off meds with repeat testing although that would likely show high readings if no other changes, and option of coronary calcium  scoring.  Deferred at this time.  All questions were answered, plan for follow-up in November.  Information provided to MyChart regarding hyperlipidemia.  Follow Up Instructions: As scheduled in November.   I discussed the assessment and treatment plan with the patient. The patient was provided an opportunity to ask questions and all were answered. The patient agreed with the plan and demonstrated an understanding of the instructions.   The patient was advised to call back or seek an in-person evaluation if the symptoms worsen or if the condition fails to improve as anticipated.   Reyes  JONELLE Pines, MD

## 2023-08-25 NOTE — Patient Instructions (Addendum)
 I think it is reasonable to remain on same dose of Lipitor for now based on your most recent labs.  We can recheck those levels again at your visit in November but please let me know if there are questions prior to that time. See info below on cholesterol.  Take care!    High Cholesterol  High cholesterol is a condition in which the blood has high levels of a white, waxy substance similar to fat (cholesterol). The liver makes all the cholesterol that the body needs. The human body needs small amounts of cholesterol to help build cells. A person gets extra or excess cholesterol from the food that he or she eats. The blood carries cholesterol from the liver to the rest of the body. If you have high cholesterol, deposits (plaques) may build up on the walls of your arteries. Arteries are the blood vessels that carry blood away from your heart. These plaques make the arteries narrow and stiff. Cholesterol plaques increase your risk for heart attack and stroke. Work with your health care provider to keep your cholesterol levels in a healthy range. What increases the risk? The following factors may make you more likely to develop this condition: Eating foods that are high in animal fat (saturated fat) or cholesterol. Being overweight. Not getting enough exercise. A family history of high cholesterol (familial hypercholesterolemia). Use of tobacco products. Having diabetes. What are the signs or symptoms? In most cases, high cholesterol does not usually cause any symptoms. In severe cases, very high cholesterol levels can cause: Fatty bumps under the skin (xanthomas). A white or gray ring around the black center (pupil) of the eye. How is this diagnosed? This condition may be diagnosed based on the results of a blood test. If you are older than 74 years of age, your health care provider may check your cholesterol levels every 4-6 years. You may be checked more often if you have high cholesterol or  other risk factors for heart disease. The blood test for cholesterol measures: Bad cholesterol, or LDL cholesterol. This is the main type of cholesterol that causes heart disease. The desired level is less than 100 mg/dL (7.40 mmol/L). Good cholesterol, or HDL cholesterol. HDL helps protect against heart disease by cleaning the arteries and carrying the LDL to the liver for processing. The desired level for HDL is 60 mg/dL (8.44 mmol/L) or higher. Triglycerides. These are fats that your body can store or burn for energy. The desired level is less than 150 mg/dL (8.30 mmol/L). Total cholesterol. This measures the total amount of cholesterol in your blood and includes LDL, HDL, and triglycerides. The desired level is less than 200 mg/dL (4.82 mmol/L). How is this treated? Treatment for high cholesterol starts with lifestyle changes, such as diet and exercise. Diet changes. You may be asked to eat foods that have more fiber and less saturated fats or added sugar. Lifestyle changes. These may include regular exercise, maintaining a healthy weight, and quitting use of tobacco products. Medicines. These are given when diet and lifestyle changes have not worked. You may be prescribed a statin medicine to help lower your cholesterol levels. Follow these instructions at home: Eating and drinking  Eat a healthy, balanced diet. This diet includes: Daily servings of a variety of fresh, frozen, or canned fruits and vegetables. Daily servings of whole grain foods that are rich in fiber. Foods that are low in saturated fats and trans fats. These include poultry and fish without skin, lean cuts of  meat, and low-fat dairy products. A variety of fish, especially oily fish that contain omega-3 fatty acids. Aim to eat fish at least 2 times a week. Avoid foods and drinks that have added sugar. Use healthy cooking methods, such as roasting, grilling, broiling, baking, poaching, steaming, and stir-frying. Do not  fry your food except for stir-frying. If you drink alcohol: Limit how much you have to: 0-1 drink a day for women who are not pregnant. 0-2 drinks a day for men. Know how much alcohol is in a drink. In the [place] one drink equals one 12 oz bottle of beer (355 mL), one 5 oz glass of wine (148 mL), or one 1 oz glass of hard liquor (44 mL). Lifestyle  Get regular exercise. Aim to exercise for a total of 150 minutes a week. Increase your activity level by doing activities such as gardening, walking, and taking the stairs. Do not use any products that contain nicotine or tobacco. These products include cigarettes, chewing tobacco, and vaping devices, such as e-cigarettes. If you need help quitting, ask your health care provider. General instructions Take over-the-counter and prescription medicines only as told by your health care provider. Keep all follow-up visits. This is important. Where to find more information American Heart Association: www.heart.org National Heart, Lung, and Blood Institute: PopSteam.is Contact a health care provider if: You have trouble achieving or maintaining a healthy diet or weight. You are starting an exercise program. You are unable to stop smoking. Get help right away if: You have chest pain. You have trouble breathing. You have discomfort or pain in your jaw, neck, back, shoulder, or arm. You have any symptoms of a stroke. BE FAST is an easy way to remember the main warning signs of a stroke: B - Balance. Signs are dizziness, sudden trouble walking, or loss of balance. E - Eyes. Signs are trouble seeing or a sudden change in vision. F - Face. Signs are sudden weakness or numbness of the face, or the face or eyelid drooping on one side. A - Arms. Signs are weakness or numbness in an arm. This happens suddenly and usually on one side of the body. S - Speech. Signs are sudden trouble speaking, slurred speech, or trouble understanding what people say. T -  Time. Time to call emergency services. Write down what time symptoms started. You have other signs of a stroke, such as: A sudden, severe headache with no known cause. Nausea or vomiting. Seizure. These symptoms may represent a serious problem that is an emergency. Do not wait to see if the symptoms will go away. Get medical help right away. Call your local emergency services (911 in the [place] Do not drive yourself to the hospital. Summary Cholesterol plaques increase your risk for heart attack and stroke. Work with your health care provider to keep your cholesterol levels in a healthy range. Eat a healthy, balanced diet, get regular exercise, and maintain a healthy weight. Do not use any products that contain nicotine or tobacco. These products include cigarettes, chewing tobacco, and vaping devices, such as e-cigarettes. Get help right away if you have any symptoms of a stroke. This information is not intended to replace advice given to you by your health care provider. Make sure you discuss any questions you have with your health care provider. Document Revised: 08/22/2021 Document Reviewed: 03/25/2020 Elsevier Patient Education  2024 [employer].

## 2023-09-04 ENCOUNTER — Other Ambulatory Visit: Payer: Self-pay | Admitting: Family Medicine

## 2023-09-04 DIAGNOSIS — E785 Hyperlipidemia, unspecified: Secondary | ICD-10-CM

## 2023-09-06 ENCOUNTER — Encounter: Payer: Self-pay | Admitting: Pharmacist

## 2023-09-06 ENCOUNTER — Other Ambulatory Visit: Payer: Self-pay | Admitting: Gastroenterology

## 2023-09-06 DIAGNOSIS — Z860100 Personal history of colon polyps, unspecified: Secondary | ICD-10-CM

## 2023-09-06 NOTE — Progress Notes (Signed)
 Pharmacy Quality Measure Review  This patient is appearing on a report for being at risk of failing the adherence measure for cholesterol (statin) and hypertension (ACEi/ARB) medications this calendar year.   Medication: lisinopril  20mg  Last fill date: 06/07/2023 for 90 day supply.  He also filled lisinopril  on 02/03/2023 for 90 day supply. He is currently 2 days past due to refill lisinopril . Looks like he should have 1 refill remaining.   Medication: atorvastatin  10mg   Last fill date: today 09/06/2023 for 90 day supply.  Other fills in 2025 - 02/18/2023 and 06/09/2023 for 90 day supply.   Contacted pharmacy to facilitate refills. [facility] off of [facility]ill. They will fill with his other 3 prescriptions they filled today.  Madelin Ray, PharmD Clinical Pharmacist [facility]  Population Health [phone]

## 2023-11-30 ENCOUNTER — Telehealth: Payer: Self-pay

## 2023-11-30 ENCOUNTER — Other Ambulatory Visit: Payer: Self-pay | Admitting: Family Medicine

## 2023-11-30 DIAGNOSIS — M109 Gout, unspecified: Secondary | ICD-10-CM

## 2023-11-30 DIAGNOSIS — E785 Hyperlipidemia, unspecified: Secondary | ICD-10-CM

## 2023-11-30 DIAGNOSIS — I10 Essential (primary) hypertension: Secondary | ICD-10-CM

## 2023-11-30 DIAGNOSIS — R300 Dysuria: Secondary | ICD-10-CM

## 2023-11-30 MED ORDER — DOXAZOSIN MESYLATE 2 MG PO TABS: 2.0000 mg | ORAL_TABLET | Freq: Every day | ORAL | 1 refills | Status: AC

## 2023-11-30 MED ORDER — AMLODIPINE BESYLATE 5 MG PO TABS: 5.0000 mg | ORAL_TABLET | Freq: Every day | ORAL | 1 refills | Status: AC

## 2023-11-30 MED ORDER — LISINOPRIL 20 MG PO TABS: 20.0000 mg | ORAL_TABLET | Freq: Every day | ORAL | 1 refills | Status: AC

## 2023-11-30 MED ORDER — ATORVASTATIN CALCIUM 10 MG PO TABS: 10.0000 mg | ORAL_TABLET | Freq: Every day | ORAL | 0 refills | Status: DC

## 2023-11-30 MED ORDER — ALLOPURINOL 100 MG PO TABS: 100.0000 mg | ORAL_TABLET | Freq: Every day | ORAL | 1 refills | Status: AC

## 2023-11-30 NOTE — Telephone Encounter (Signed)
 Copied from CRM [id number]. Topic: Appointments - Appointment Info/Confirmation >> Nov 30, 2023 11:40 AM Mercedes MATSU wrote: Patient/patient representative is calling for information regarding an appointment.  He states that the switched AWV for 12/06/2023 @ 1:50pm is fine, patient confirmed.

## 2023-11-30 NOTE — Telephone Encounter (Signed)
 Copied from CRM [id number]. Topic: Clinical - Medication Refill >> Nov 30, 2023 11:39 AM Mercedes MATSU wrote: Medication:  allopurinol  (ZYLOPRIM ) 100 MG tablet  amLODipine  (NORVASC ) 5 MG tablet  atorvastatin  (LIPITOR) 10 MG tablet  doxazosin  (CARDURA ) 2 MG tablet  lisinopril  (ZESTRIL ) 20 MG tablet   Has the patient contacted their pharmacy? Yes (Agent: If no, request that the patient contact the pharmacy for the refill. If patient does not wish to contact the pharmacy document the reason why and proceed with request.) (Agent: If yes, when and what did the pharmacy advise?)  This is the patient's preferred pharmacy:  [id number] [place] [place] - [address] AT [facility] [address] [place] [address] Phone: [phone] Fax: [phone]  Is this the correct pharmacy for this prescription? Yes If no, delete pharmacy and type the correct one.   Has the prescription been filled recently? Yes  Is the patient out of the medication? Yes  Has the patient been seen for an appointment in the last year OR does the patient have an upcoming appointment? Yes  Can we respond through MyChart? Yes  Agent: Please be advised that Rx refills may take up to 3 business days. We ask that you follow-up with your pharmacy.

## 2023-12-03 DIAGNOSIS — H5203 Hypermetropia, bilateral: Secondary | ICD-10-CM | POA: Diagnosis not present

## 2023-12-03 DIAGNOSIS — H52223 Regular astigmatism, bilateral: Secondary | ICD-10-CM | POA: Diagnosis not present

## 2023-12-03 DIAGNOSIS — H524 Presbyopia: Secondary | ICD-10-CM | POA: Diagnosis not present

## 2023-12-06 ENCOUNTER — Ambulatory Visit

## 2023-12-06 VITALS — Ht 70.0 in | Wt 170.0 lb

## 2023-12-06 DIAGNOSIS — Z0000 Encounter for general adult medical examination without abnormal findings: Secondary | ICD-10-CM

## 2023-12-06 NOTE — Progress Notes (Signed)
 Subjective:   Matthew Simon is a 73 y.o. male who presents for a [employer] Visit.  Allergies (verified) Patient has no known allergies.   History: Past Medical History:  Diagnosis Date   Gout    Hypertension    Past Surgical History:  Procedure Laterality Date   ARTHROSCOPIC REPAIR ACL Left    apx July    COLONOSCOPY     EXCISION MASS NECK N/A 05/24/2017   Procedure: EXCISION POSTERIOR NECK MASS;  Surgeon: Eletha Boas, MD;  Location: [facility] OR;  Service: General;  Laterality: N/A;   QUADRICEPS TENDON REPAIR Left 08/10/2019   Procedure: REPAIR QUADRICEP TENDON RUPTURE;  Surgeon: Addie Cordella Hamilton, MD;  Location: [facility] OR;  Service: Orthopedics;  Laterality: Left;   WISDOM TOOTH EXTRACTION     Family History  Problem Relation Age of Onset   Cancer Mother        lymphoscarcoma   Stroke Father    Colon cancer Neg Hx    Rectal cancer Neg Hx    Stomach cancer Neg Hx    Social History   Occupational History   Occupation: business development   Occupation: SEMI-RETIRED  Tobacco Use   Smoking status: Former    Current packs/day: 0.00    Types: Cigarettes    Quit date: 05/10/1964    Years since quitting: 59.6   Smokeless tobacco: Never  Vaping Use   Vaping status: Never Used  Substance and Sexual Activity   Alcohol use: Yes    Alcohol/week: 20.0 standard drinks of alcohol    Types: 10 Glasses of wine, 10 Standard drinks or equivalent per week    Comment: wine with dinner   Drug use: No   Sexual activity: Not Currently   Tobacco Counseling Counseling given: Not Answered  SDOH Screenings   Food Insecurity: Unknown (12/06/2023)  Housing: Low Risk  (12/06/2023)  Transportation Needs: No Transportation Needs (12/06/2023)  Utilities: Not At Risk (12/06/2023)  Alcohol Screen: Low Risk  (12/06/2023)  Depression (PHQ2-9): Low Risk  (12/06/2023)  Financial Resource Strain: Low Risk  (12/06/2023)  Physical Activity: Insufficiently Active (12/06/2023)  Social  Connections: Unknown (12/06/2023)  Stress: Stress Concern Present (12/06/2023)  Tobacco Use: Medium Risk (12/06/2023)  Health Literacy: Adequate Health Literacy (12/06/2023)   Depression Screen    12/06/2023    2:00 PM 06/07/2023    8:56 AM 10/21/2022    8:36 AM 06/01/2022    3:13 PM 12/18/2021   12:14 PM 12/08/2021   12:11 PM 05/31/2020    9:20 AM  PHQ 2/9 Scores  PHQ - 2 Score 0 0 0 1 0 1 0  PHQ- 9 Score 0 0 0 2 0 3      Goals Addressed             This Visit's Progress    Patient Stated       Would like to finish book he is writing/2025 still working on it       Visit info / Clinical Intake: Medicare Wellness Visit Type:: Welcome to [employer] [facility] SOCIAL RESEARCH OFFICER) Medicare Wellness Visit Mode:: Telephone If telephone:: video declined If telephone or video:: unable to obtan vitals due to lack of equipment Interpreter Needed?: No Pre-visit prep was completed: yes AWV questionnaire completed by patient prior to visit?: yes Date:: 12/06/23 Living arrangements:: lives with spouse/significant other Patient's Overall Health Status Rating: very good Typical amount of pain: none Does pain affect daily life?: no Are you currently prescribed opioids?: no  Dietary  Habits and Nutritional Risks How many meals a day?: 2 Eats fruit and vegetables daily?: yes Most meals are obtained by: preparing own meals Diabetic:: no  Functional Status Activities of Daily Living (to include ambulation/medication): Independent Ambulation: Independent with device- listed below Home Assistive Devices/Equipment: Eyeglasses Medication Administration: Independent Home Management: Independent Manage your own finances?: yes Primary transportation is: driving Concerns about vision?: no *vision screening is required for WTM* Concerns about hearing?: no  Fall Screening Falls in the past year?: 0 Number of falls in past year: 0 Was there an injury with Fall?: 0 Fall Risk Category Calculator: 0 Patient Fall Risk  Level: Low Fall Risk  Fall Risk Patient at Risk for Falls Due to: No Fall Risks Fall risk Follow up: Falls evaluation completed; Falls prevention discussed  Home and Transportation Safety: All rugs have non-skid backing?: yes All stairs or steps have railings?: yes (front,back and inside) Grab bars in the bathtub or shower?: yes (in 2nd bathroom) Have non-skid surface in bathtub or shower?: yes Regular seat belt use?: yes Hospital stays in the last year:: no  Cognitive Assessment Difficulty concentrating, remembering, or making decisions? : no Will 6CIT or Mini Cog be Completed: no 6CIT or Mini Cog Declined: patient alert, oriented, able to answer questions appropriately and recall recent events  Advance Directives (For Healthcare) Does Patient Have a Medical Advance Directive?: Yes Type of Advance Directive: Healthcare Power of [place]; Living will        Objective:    Today's Vitals   12/06/23 1346  Weight: 170 lb (77.1 kg)  Height: 5' 10 (1.778 m)   Body mass index is 24.39 kg/m.  Current Medications (verified) Outpatient Encounter Medications as of 12/06/2023  Medication Sig   allopurinol  (ZYLOPRIM ) 100 MG tablet Take 1 tablet (100 mg total) by mouth daily.   amLODipine  (NORVASC ) 5 MG tablet Take 1 tablet (5 mg total) by mouth daily.   atorvastatin  (LIPITOR) 10 MG tablet Take 1 tablet (10 mg total) by mouth daily.   doxazosin  (CARDURA ) 2 MG tablet Take 1 tablet (2 mg total) by mouth daily.   lisinopril  (ZESTRIL ) 20 MG tablet Take 1 tablet (20 mg total) by mouth daily.   No facility-administered encounter medications on file as of 12/06/2023.   Hearing/Vision screen Hearing Screening - Comments:: Denies hearing difficulties   Vision Screening - Comments:: Wears eyeglasses/[facility]/up to date. Immunizations and Health Maintenance Health Maintenance  Topic Date Due   Diabetic kidney evaluation - Urine ACR  Never done   Influenza Vaccine   09/03/2023   OPHTHALMOLOGY EXAM  09/18/2023   COVID-19 Vaccine (4 - 2025-26 season) 10/04/2023   HEMOGLOBIN A1C  01/18/2024   FOOT EXAM  06/06/2024   Diabetic kidney evaluation - eGFR measurement  07/18/2024   Medicare Annual Wellness (AWV)  12/05/2024   Colonoscopy  03/09/2028   Pneumococcal Vaccine: 50+ Years  Completed   Hepatitis C Screening  Completed   Zoster Vaccines- Shingrix  Completed   Meningococcal B Vaccine  Aged Out   DTaP/Tdap/Td  Discontinued        Assessment/Plan:  This is a routine wellness examination for Matthew Simon.  Patient Care Team: Levora Reyes SAUNDERS, MD as PCP - General (Family Medicine) [facility]  I have personally reviewed and noted the following in the patient's chart:   Medical and social history Use of alcohol, tobacco or illicit drugs  Current medications and supplements including opioid prescriptions. Functional ability and status Nutritional  status Physical activity Advanced directives List of other physicians Hospitalizations, surgeries, and ER visits in previous 12 months Vitals Screenings to include cognitive, depression, and falls Referrals and appointments  No orders of the defined types were placed in this encounter.  In addition, I have reviewed and discussed with patient certain preventive protocols, quality metrics, and best practice recommendations. A written personalized care plan for preventive services as well as general preventive health recommendations were provided to patient.   Matthew Simon, CMA   12/06/2023   Return in 1 year (on 12/05/2024).  After Visit Summary: (MyChart) Due to this being a telephonic visit, the after visit summary with patients personalized plan was offered to patient via MyChart   Nurse Notes: Patient is due for a flu vaccine.  Patient stated that he is up to date with his diabetic eye exam.  I have sent a request for records out today.

## 2023-12-06 NOTE — Patient Instructions (Signed)
 [name],  Thank you for taking the time for your Medicare Wellness Visit. I appreciate your continued commitment to your health goals. Please review the care plan we discussed, and feel free to reach out if I can assist you further.  Please note that Annual Wellness Visits do not include a physical exam. Some assessments may be limited, especially if the visit was conducted virtually. If needed, we may recommend an in-person follow-up with your provider.  Ongoing Care Seeing your primary care provider every 3 to 6 months helps us  monitor your health and provide consistent, personalized care. Next office visit 12/20/2023.  You are due for a Flu vaccine and can get that at the pharmacy or during your next office visit.    Referrals If a referral was made during today's visit and you haven't received any updates within two weeks, please contact the referred provider directly to check on the status.  Recommended Screenings:  Health Maintenance  Topic Date Due   Yearly kidney health urinalysis for diabetes  Never done   Flu Shot  09/03/2023   Eye exam for diabetics  09/18/2023   COVID-19 Vaccine (4 - 2025-26 season) 10/04/2023   Hemoglobin A1C  01/18/2024   Complete foot exam   06/06/2024   Yearly kidney function blood test for diabetes  07/18/2024   Medicare Annual Wellness Visit  12/05/2024   Colon Cancer Screening  03/09/2028   Pneumococcal Vaccine for age over 48  Completed   Hepatitis C Screening  Completed   Zoster (Shingles) Vaccine  Completed   Meningitis B Vaccine  Aged Out   DTaP/Tdap/Td vaccine  Discontinued       12/06/2023    1:50 PM  Advanced Directives  Does Patient Have a Medical Advance Directive? Yes  Type of Estate Agent of [place];Living will    Vision: Annual vision screenings are recommended for early detection of glaucoma, cataracts, and diabetic retinopathy. These exams can also reveal signs of chronic conditions such as diabetes and  high blood pressure.  Dental: Annual dental screenings help detect early signs of oral cancer, gum disease, and other conditions linked to overall health, including heart disease and diabetes.  Please see the attached documents for additional preventive care recommendations.

## 2023-12-06 NOTE — Telephone Encounter (Signed)
 Called patients pharmacy and they did receive refill. Medication is ready for pick up. Tried to call patient and left vm to return call. If patient calls back, please relay message that medication was rcvd by pharmacy on 10/28 and is ready for pick up   Copied from CRM #[id number]. Topic: Clinical - Prescription Issue >> Dec 06, 2023  7:31 AM Aleatha C wrote: Reason for CRM: Patient got his medication refilled and his doxazosin  (CARDURA ) 2 MG tablet was missing from his refills, he needs it be refilled and [facility] to [id number] - [place], [place] - [address] AT [facility] [address] [place] [address] Phone: [phone] Fax: [phone] Hours: Open 24 hours

## 2023-12-07 ENCOUNTER — Encounter

## 2023-12-08 ENCOUNTER — Ambulatory Visit: Admitting: Family Medicine

## 2023-12-08 NOTE — Progress Notes (Deleted)
 Subjective:   Matthew Simon is a 73 y.o. male who presents for a [employer] Visit.  Allergies (verified) Patient has no known allergies.   History: Past Medical History:  Diagnosis Date   Gout    Hypertension    Past Surgical History:  Procedure Laterality Date   ARTHROSCOPIC REPAIR ACL Left    apx July    COLONOSCOPY     EXCISION MASS NECK N/A 05/24/2017   Procedure: EXCISION POSTERIOR NECK MASS;  Surgeon: Eletha Boas, MD;  Location: [facility] OR;  Service: General;  Laterality: N/A;   QUADRICEPS TENDON REPAIR Left 08/10/2019   Procedure: REPAIR QUADRICEP TENDON RUPTURE;  Surgeon: Addie Cordella Hamilton, MD;  Location: [facility] OR;  Service: Orthopedics;  Laterality: Left;   WISDOM TOOTH EXTRACTION     Family History  Problem Relation Age of Onset   Cancer Mother        lymphoscarcoma   Stroke Father    Colon cancer Neg Hx    Rectal cancer Neg Hx    Stomach cancer Neg Hx    Social History   Occupational History   Occupation: business development   Occupation: SEMI-RETIRED  Tobacco Use   Smoking status: Former    Current packs/day: 0.00    Types: Cigarettes    Quit date: 05/10/1964    Years since quitting: 59.6   Smokeless tobacco: Never  Vaping Use   Vaping status: Never Used  Substance and Sexual Activity   Alcohol use: Yes    Alcohol/week: 20.0 standard drinks of alcohol    Types: 10 Glasses of wine, 10 Standard drinks or equivalent per week    Comment: wine with dinner   Drug use: No   Sexual activity: Not Currently   Tobacco Counseling Counseling given: Not Answered  SDOH Screenings   Food Insecurity: Unknown (12/06/2023)  Housing: Low Risk  (12/06/2023)  Transportation Needs: No Transportation Needs (12/06/2023)  Utilities: Not At Risk (12/06/2023)  Alcohol Screen: Low Risk  (12/06/2023)  Depression (PHQ2-9): Low Risk  (12/06/2023)  Financial Resource Strain: Low Risk  (12/06/2023)  Physical Activity: Insufficiently Active (12/06/2023)  Social  Connections: Unknown (12/06/2023)  Stress: Stress Concern Present (12/06/2023)  Tobacco Use: Medium Risk (12/06/2023)  Health Literacy: Adequate Health Literacy (12/06/2023)   Depression Screen    12/06/2023    2:00 PM 06/07/2023    8:56 AM 10/21/2022    8:36 AM 06/01/2022    3:13 PM 12/18/2021   12:14 PM 12/08/2021   12:11 PM 05/31/2020    9:20 AM  PHQ 2/9 Scores  PHQ - 2 Score 0 0 0 1 0 1 0  PHQ- 9 Score 0 0 0 2 0 3      Goals Addressed             This Visit's Progress    Patient Stated       Would like to finish book he is writing/2025 still working on it       Visit info / Clinical Intake: Medicare Wellness Visit Type:: Subsequent Annual Wellness Visit Medicare Wellness Visit Mode:: Telephone If telephone:: video declined If telephone or video:: unable to obtan vitals due to lack of equipment Interpreter Needed?: No Pre-visit prep was completed: yes AWV questionnaire completed by patient prior to visit?: yes Date:: 12/06/23 Living arrangements:: lives with spouse/significant other Patient's Overall Health Status Rating: very good Typical amount of pain: none Does pain affect daily life?: no Are you currently prescribed opioids?: no  Dietary Habits  and Nutritional Risks How many meals a day?: 2 Eats fruit and vegetables daily?: yes Most meals are obtained by: preparing own meals Diabetic:: no  Functional Status Activities of Daily Living (to include ambulation/medication): Independent Ambulation: Independent with device- listed below Home Assistive Devices/Equipment: Eyeglasses Medication Administration: Independent Home Management: Independent Manage your own finances?: yes Primary transportation is: driving Concerns about vision?: no *vision screening is required for WTM* Concerns about hearing?: no  Fall Screening Falls in the past year?: 0 Number of falls in past year: 0 Was there an injury with Fall?: 0 Fall Risk Category Calculator: 0 Patient Fall  Risk Level: Low Fall Risk  Fall Risk Patient at Risk for Falls Due to: No Fall Risks Fall risk Follow up: Falls evaluation completed; Falls prevention discussed  Home and Transportation Safety: All rugs have non-skid backing?: yes All stairs or steps have railings?: yes (front,back and inside) Grab bars in the bathtub or shower?: yes (in 2nd bathroom) Have non-skid surface in bathtub or shower?: yes Regular seat belt use?: yes Hospital stays in the last year:: no  Cognitive Assessment Difficulty concentrating, remembering, or making decisions? : no Will 6CIT or Mini Cog be Completed: no 6CIT or Mini Cog Declined: patient alert, oriented, able to answer questions appropriately and recall recent events  Advance Directives (For Healthcare) Does Patient Have a Medical Advance Directive?: Yes Type of Advance Directive: Healthcare Power of [place]; Living will  Reviewed/Updated  Reviewed/Updated: All        Objective:    Today's Vitals   12/06/23 1346  Weight: 170 lb (77.1 kg)  Height: 5' 10 (1.778 m)   Body mass index is 24.39 kg/m.  Current Medications (verified) Outpatient Encounter Medications as of 12/06/2023  Medication Sig   allopurinol  (ZYLOPRIM ) 100 MG tablet Take 1 tablet (100 mg total) by mouth daily.   amLODipine  (NORVASC ) 5 MG tablet Take 1 tablet (5 mg total) by mouth daily.   atorvastatin  (LIPITOR) 10 MG tablet Take 1 tablet (10 mg total) by mouth daily.   doxazosin  (CARDURA ) 2 MG tablet Take 1 tablet (2 mg total) by mouth daily.   lisinopril  (ZESTRIL ) 20 MG tablet Take 1 tablet (20 mg total) by mouth daily.   No facility-administered encounter medications on file as of 12/06/2023.   Hearing/Vision screen Hearing Screening - Comments:: Denies hearing difficulties   Vision Screening - Comments:: Wears eyeglasses/[facility]/up to date. Immunizations and Health Maintenance Health Maintenance  Topic Date Due   Diabetic kidney evaluation -  Urine ACR  Never done   Influenza Vaccine  09/03/2023   OPHTHALMOLOGY EXAM  09/18/2023   COVID-19 Vaccine (4 - 2025-26 season) 10/04/2023   HEMOGLOBIN A1C  01/18/2024   FOOT EXAM  06/06/2024   Diabetic kidney evaluation - eGFR measurement  07/18/2024   Medicare Annual Wellness (AWV)  12/05/2024   Colonoscopy  03/09/2028   Pneumococcal Vaccine: 50+ Years  Completed   Hepatitis C Screening  Completed   Zoster Vaccines- Shingrix  Completed   Meningococcal B Vaccine  Aged Out   DTaP/Tdap/Td  Discontinued        Assessment/Plan:  This is a routine wellness examination for [place].  Patient Care Team: Levora Reyes SAUNDERS, MD as PCP - General (Family Medicine) [facility]  I have personally reviewed and noted the following in the patient's chart:   Medical and social history Use of alcohol, tobacco or illicit drugs  Current medications and supplements including opioid prescriptions. Functional  ability and status Nutritional status Physical activity Advanced directives List of other physicians Hospitalizations, surgeries, and ER visits in previous 12 months Vitals Screenings to include cognitive, depression, and falls Referrals and appointments  No orders of the defined types were placed in this encounter.  In addition, I have reviewed and discussed with patient certain preventive protocols, quality metrics, and best practice recommendations. A written personalized care plan for preventive services as well as general preventive health recommendations were provided to patient.   Linsay Vogt L Sherman Lipuma, CMA   12/08/2023   Return in 1 year (on 12/05/2024).  After Visit Summary: (MyChart) Due to this being a telephonic visit, the after visit summary with patients personalized plan was offered to patient via MyChart   Nurse Notes: N/A

## 2023-12-10 ENCOUNTER — Encounter: Payer: Self-pay | Admitting: Pharmacist

## 2023-12-10 NOTE — Progress Notes (Signed)
 Pharmacy Quality Measure Review  This patient is appearing on a report for being at risk of failing the adherence measure for cholesterol (statin) medications this calendar year.   Medication: atorvastatin  10mg   Last fill date: 09/06/2023 for 90 day supply.  Other fills in 2025 - 02/18/2023 and 06/09/2023 for 90 day supply.   Reviewed recent refill history in Dr Annemarie database. Actual last refill date was 11/30/2023 for 90 day supply. Patient has no refills remaining. Next appointment with PCP is 12/20/2023.   I had also been following patient for low adherence for lisinopril . [facility] when he other maintenance medications had been filled. [facility]  with other maintenance med but was not filled in August 2025. Patient did recently fill lisinopril  11/30/2023 for 90 day supply. Unfortunately due to missed fill in August, patient will fail Adherence measure for hypertension in 2025. Will follow in 2026 with intervention as needed.   BP Readings from Last 3 Encounters:  06/07/23 134/76  03/10/23 (!) 168/88  12/04/22 130/70    Madelin Ray, PharmD Clinical Pharmacist [facility]  Population Health [phone]

## 2023-12-16 NOTE — Progress Notes (Addendum)
 Subjective:   Matthew Simon is a 73 y.o. male who presents for a [employer] Visit.  I connected with  Tevan P Shaver on 12/06/2023 by a audio enabled telemedicine application and verified that I am speaking with the correct person using two identifiers.  Patient Location: Home  Provider Location: Home Office  Persons Participating in Visit: Patient.  I discussed the limitations of evaluation and management by telemedicine. The patient expressed understanding and agreed to proceed.  Vital Signs: Because this visit was a virtual/telehealth visit, some criteria may be missing or patient reported. Any vitals not documented were not able to be obtained and vitals that have been documented are patient reported.  Allergies (verified) Patient has no known allergies.   History: Past Medical History:  Diagnosis Date   Gout    Hypertension    Past Surgical History:  Procedure Laterality Date   ARTHROSCOPIC REPAIR ACL Left    apx July    COLONOSCOPY     EXCISION MASS NECK N/A 05/24/2017   Procedure: EXCISION POSTERIOR NECK MASS;  Surgeon: Eletha Boas, MD;  Location: [facility] OR;  Service: General;  Laterality: N/A;   QUADRICEPS TENDON REPAIR Left 08/10/2019   Procedure: REPAIR QUADRICEP TENDON RUPTURE;  Surgeon: Addie Cordella Hamilton, MD;  Location: [facility] OR;  Service: Orthopedics;  Laterality: Left;   WISDOM TOOTH EXTRACTION     Family History  Problem Relation Age of Onset   Cancer Mother        lymphoscarcoma   Stroke Father    Colon cancer Neg Hx    Rectal cancer Neg Hx    Stomach cancer Neg Hx    Social History   Occupational History   Occupation: business development   Occupation: SEMI-RETIRED  Tobacco Use   Smoking status: Former    Current packs/day: 0.00    Types: Cigarettes    Quit date: 05/10/1964    Years since quitting: 59.6   Smokeless tobacco: Never  Vaping Use   Vaping status: Never Used  Substance and Sexual Activity   Alcohol use: Yes     Alcohol/week: 20.0 standard drinks of alcohol    Types: 10 Glasses of wine, 10 Standard drinks or equivalent per week    Comment: wine with dinner   Drug use: No   Sexual activity: Not Currently   Tobacco Counseling Counseling given: Not Answered  SDOH Screenings   Food Insecurity: Unknown (12/06/2023)  Housing: Low Risk  (12/06/2023)  Transportation Needs: No Transportation Needs (12/06/2023)  Utilities: Not At Risk (12/06/2023)  Alcohol Screen: Low Risk  (12/06/2023)  Depression (PHQ2-9): Low Risk  (12/06/2023)  Financial Resource Strain: Low Risk  (12/06/2023)  Physical Activity: Insufficiently Active (12/06/2023)  Social Connections: Unknown (12/06/2023)  Stress: Stress Concern Present (12/06/2023)  Tobacco Use: Medium Risk (12/06/2023)  Health Literacy: Adequate Health Literacy (12/06/2023)   Depression Screen    12/06/2023    2:00 PM 06/07/2023    8:56 AM 10/21/2022    8:36 AM 06/01/2022    3:13 PM 12/18/2021   12:14 PM 12/08/2021   12:11 PM 05/31/2020    9:20 AM  PHQ 2/9 Scores  PHQ - 2 Score 0 0 0 1 0 1 0  PHQ- 9 Score 0  0  0  2  0  3       Data saved with a previous flowsheet row definition     Goals Addressed             This  Visit's Progress    Patient Stated       Would like to finish book he is writing/2025 still working on it       Visit info / Clinical Intake: Medicare Wellness Visit Type:: Subsequent Annual Wellness Visit Medicare Wellness Visit Mode:: Telephone If telephone:: video declined Because this visit was a virtual/telehealth visit:: unable to obtan vitals due to lack of equipment Interpreter Needed?: No Pre-visit prep was completed: yes AWV questionnaire completed by patient prior to visit?: yes Date:: 12/06/23 Living arrangements:: lives with spouse/significant other Patient's Overall Health Status Rating: very good Typical amount of pain: none Does pain affect daily life?: no Are you currently prescribed opioids?: no  Dietary Habits and  Nutritional Risks How many meals a day?: 2 Eats fruit and vegetables daily?: yes Most meals are obtained by: preparing own meals Diabetic:: no  Functional Status Activities of Daily Living (to include ambulation/medication): Independent Ambulation: Independent with device- listed below Home Assistive Devices/Equipment: Eyeglasses Medication Administration: Independent Home Management: Independent Manage your own finances?: yes Primary transportation is: driving Concerns about vision?: no *vision screening is required for WTM* Concerns about hearing?: no  Fall Screening Falls in the past year?: 0 Number of falls in past year: 0 Was there an injury with Fall?: 0 Fall Risk Category Calculator: 0 Patient Fall Risk Level: Low Fall Risk  Fall Risk Patient at Risk for Falls Due to: No Fall Risks Fall risk Follow up: Falls evaluation completed; Falls prevention discussed  Home and Transportation Safety: All rugs have non-skid backing?: yes All stairs or steps have railings?: yes (front,back and inside) Grab bars in the bathtub or shower?: yes (in 2nd bathroom) Have non-skid surface in bathtub or shower?: yes Regular seat belt use?: yes Hospital stays in the last year:: no  Cognitive Assessment Difficulty concentrating, remembering, or making decisions? : no Will 6CIT or Mini Cog be Completed: no 6CIT or Mini Cog Declined: patient alert, oriented, able to answer questions appropriately and recall recent events  Advance Directives (For Healthcare) Does Patient Have a Medical Advance Directive?: Yes Type of Advance Directive: Healthcare Power of [place]; Living will  Reviewed/Updated  Reviewed/Updated: All        Objective:    Today's Vitals   12/06/23 1346  Weight: 170 lb (77.1 kg)  Height: 5' 10 (1.778 m)   Body mass index is 24.39 kg/m.  Current Medications (verified) Outpatient Encounter Medications as of 12/06/2023  Medication Sig   allopurinol  (ZYLOPRIM )  100 MG tablet Take 1 tablet (100 mg total) by mouth daily.   amLODipine  (NORVASC ) 5 MG tablet Take 1 tablet (5 mg total) by mouth daily.   atorvastatin  (LIPITOR) 10 MG tablet Take 1 tablet (10 mg total) by mouth daily.   doxazosin  (CARDURA ) 2 MG tablet Take 1 tablet (2 mg total) by mouth daily.   lisinopril  (ZESTRIL ) 20 MG tablet Take 1 tablet (20 mg total) by mouth daily.   No facility-administered encounter medications on file as of 12/06/2023.   Hearing/Vision screen Hearing Screening - Comments:: Denies hearing difficulties   Vision Screening - Comments:: Wears eyeglasses/[facility]/up to date. Immunizations and Health Maintenance Health Maintenance  Topic Date Due   Diabetic kidney evaluation - Urine ACR  Never done   Influenza Vaccine  09/03/2023   OPHTHALMOLOGY EXAM  09/18/2023   COVID-19 Vaccine (4 - 2025-26 season) 10/04/2023   HEMOGLOBIN A1C  01/18/2024   FOOT EXAM  06/06/2024   Diabetic kidney evaluation - eGFR measurement  07/18/2024   Medicare Annual Wellness (AWV)  12/05/2024   Colonoscopy  03/09/2028   Pneumococcal Vaccine: 50+ Years  Completed   Hepatitis C Screening  Completed   Zoster Vaccines- Shingrix  Completed   Meningococcal B Vaccine  Aged Out   DTaP/Tdap/Td  Discontinued        Assessment/Plan:  This is a routine wellness examination for [place].  Patient Care Team: Levora Reyes SAUNDERS, MD as PCP - General (Family Medicine) [facility]  I have personally reviewed and noted the following in the patient's chart:   Medical and social history Use of alcohol, tobacco or illicit drugs  Current medications and supplements including opioid prescriptions. Functional ability and status Nutritional status Physical activity Advanced directives List of other physicians Hospitalizations, surgeries, and ER visits in previous 12 months Vitals Screenings to include cognitive, depression, and falls Referrals and  appointments  No orders of the defined types were placed in this encounter.  In addition, I have reviewed and discussed with patient certain preventive protocols, quality metrics, and best practice recommendations. A written personalized care plan for preventive services as well as general preventive health recommendations were provided to patient.   Tenaya Hilyer L Takeira Yanes, CMA   12/06/2023  Return in 1 year (on 12/05/2024).  After Visit Summary: (MyChart) Due to this being a telephonic visit, the after visit summary with patients personalized plan was offered to patient via MyChart   Nurse Notes: N/A

## 2023-12-20 ENCOUNTER — Ambulatory Visit: Admitting: Family Medicine

## 2023-12-23 ENCOUNTER — Ambulatory Visit: Admitting: Family Medicine

## 2023-12-23 ENCOUNTER — Encounter: Payer: Self-pay | Admitting: Family Medicine

## 2023-12-23 VITALS — BP 122/70 | HR 82 | Temp 98.6°F | Resp 14 | Ht 70.0 in | Wt 185.8 lb

## 2023-12-23 DIAGNOSIS — M109 Gout, unspecified: Secondary | ICD-10-CM | POA: Diagnosis not present

## 2023-12-23 DIAGNOSIS — N401 Enlarged prostate with lower urinary tract symptoms: Secondary | ICD-10-CM

## 2023-12-23 DIAGNOSIS — I10 Essential (primary) hypertension: Secondary | ICD-10-CM

## 2023-12-23 DIAGNOSIS — E785 Hyperlipidemia, unspecified: Secondary | ICD-10-CM | POA: Diagnosis not present

## 2023-12-23 DIAGNOSIS — I77811 Abdominal aortic ectasia: Secondary | ICD-10-CM

## 2023-12-23 DIAGNOSIS — M25532 Pain in left wrist: Secondary | ICD-10-CM

## 2023-12-23 DIAGNOSIS — Z23 Encounter for immunization: Secondary | ICD-10-CM

## 2023-12-23 DIAGNOSIS — R3911 Hesitancy of micturition: Secondary | ICD-10-CM | POA: Diagnosis not present

## 2023-12-23 NOTE — Progress Notes (Signed)
 Subjective:  Patient ID: Matthew Simon, male    DOB: 1950/11/08  Age: 73 y.o. MRN: [id number]  CC:  Chief Complaint  Patient presents with   Follow-up    6 month follow up. Patient does not have any questions or concerns.     HPI Matthew Simon presents for  Follow up.   L wrist pain: Prior tennis injuries. Left wrist has been mor painful past few weeks. Playing golf, no new activities, no injury. Still playing golf and tennis. Sore in middle of day. No swelling Tx: voltaren gel - few times, morning only. Some relief.  R hand dominant.  No prior left wrist fracture, or injection.    Hyperlipidemia: Lipitor 10 mg daily. No new myalgias, side effects.  Lab Results  Component Value Date   CHOL 151 07/19/2023   HDL 67.80 07/19/2023   LDLCALC 71 07/19/2023   TRIG 59.0 07/19/2023   CHOLHDL 2 07/19/2023   Lab Results  Component Value Date   ALT 22 07/19/2023   AST 27 07/19/2023   ALKPHOS 41 07/19/2023   BILITOT 0.6 07/19/2023   Hypertension: Treated with amlodipine , lisinopril , doxazosin .  Previous elevation off meds and improved in May on meds at that time at 134/76.  Denies new side effects with medication. No missed doses of meds.  Home readings:none recently.  [employer] sandwich for lunch.   BP Readings from Last 3 Encounters:  12/23/23 122/70  06/07/23 134/76  03/10/23 (!) 168/88   Lab Results  Component Value Date   CREATININE 1.09 07/19/2023    Gout Daily meds: Allopurinol  100 mg daily without any side effects.  No recent flares. Prn med: None needed. Lab Results  Component Value Date   LABURIC 5.8 06/01/2022   Possible history of prediabetes but A1c has been in the normal range, not prediabetic range on most recent testing, and has been off metformin  since approximately 2017.  See readings on May 5 visit with A1c is going back to 2013 Lab Results  Component Value Date   HGBA1C 4.8 07/19/2023    Weak urinary stream: Periodic. Past year or so. No  dysuria. Incomplete emptying.  No weight loss, night sweats, or retention symptoms.   No recent urology eval. Saw in 2021 - BPH. Doxazosin  2mg  once per day does help. Has taken 2 on occasion - seems to help urination  Similar to symptoms in 2021.  Lab Results  Component Value Date   PSA1 1.6 11/23/2018   PSA1 1.3 03/21/2018   PSA1 1.5 05/05/2016   PSA 2.11 12/04/2022   PSA 20.47 (H) 12/24/2014   PSA 1.09 02/12/2012   Flu vaccine today.    History Patient Active Problem List   Diagnosis Date Noted   Rupture of left quadriceps tendon 08/09/2019   Soft tissue mass 05/22/2017   Gout 05/05/2016   Hypertension 04/10/2011   Colon polyp 04/10/2011   Past Medical History:  Diagnosis Date   Gout    Hypertension    Past Surgical History:  Procedure Laterality Date   ARTHROSCOPIC REPAIR ACL Left    apx July    COLONOSCOPY     EXCISION MASS NECK N/A 05/24/2017   Procedure: EXCISION POSTERIOR NECK MASS;  Surgeon: Eletha Boas, MD;  Location: [facility] OR;  Service: General;  Laterality: N/A;   QUADRICEPS TENDON REPAIR Left 08/10/2019   Procedure: REPAIR QUADRICEP TENDON RUPTURE;  Surgeon: Addie Cordella Hamilton, MD;  Location: [facility] OR;  Service: Orthopedics;  Laterality: Left;   WISDOM  TOOTH EXTRACTION     No Known Allergies Prior to Admission medications   Medication Sig Start Date End Date Taking? Authorizing Provider  allopurinol  (ZYLOPRIM ) 100 MG tablet Take 1 tablet (100 mg total) by mouth daily. 11/30/23  Yes Levora Reyes SAUNDERS, MD  amLODipine  (NORVASC ) 5 MG tablet Take 1 tablet (5 mg total) by mouth daily. 11/30/23  Yes Levora Reyes SAUNDERS, MD  atorvastatin  (LIPITOR) 10 MG tablet Take 1 tablet (10 mg total) by mouth daily. 11/30/23  Yes Levora Reyes SAUNDERS, MD  doxazosin  (CARDURA ) 2 MG tablet Take 1 tablet (2 mg total) by mouth daily. 11/30/23  Yes Levora Reyes SAUNDERS, MD  lisinopril  (ZESTRIL ) 20 MG tablet Take 1 tablet (20 mg total) by mouth daily. 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  Tobacco Use   Smoking status: Former    Current packs/day: 0.00    Types: Cigarettes    Quit date: 05/10/1964    Years since quitting: 59.6   Smokeless tobacco: Never  Vaping Use   Vaping status: Never Used  Substance and Sexual Activity   Alcohol use: Yes    Alcohol/week: 20.0 standard drinks of alcohol    Types: 10 Glasses of wine, 10 Standard drinks or equivalent per week    Comment: wine with dinner   Drug use: No   Sexual activity: Not Currently  Other Topics Concern   Not on file  Social History Narrative   Lives with wife/2025   Social Drivers of Health   Financial Resource Strain: Low Risk  (12/06/2023)   Overall Financial Resource Strain (CARDIA)    Difficulty of Paying Living Expenses: Not hard at all  Food Insecurity: Unknown (12/06/2023)   Hunger Vital Sign    Worried About Running Out of Food in the Last Year: Never true    Ran Out of Food in the Last Year: Not on file  Transportation Needs: No Transportation Needs (12/06/2023)   PRAPARE - Administrator, Civil Service (Medical): No    Lack of Transportation (Non-Medical): No  Physical Activity: Insufficiently Active (12/06/2023)   Exercise Vital Sign    Days of Exercise per Week: 2 days    Minutes of Exercise per Session: 50 min  Stress: Stress Concern Present (12/06/2023)   [employer] of Occupational Health - Occupational Stress Questionnaire    Feeling of Stress: To some extent  Social Connections: Unknown (12/06/2023)   Social Connection and Isolation Panel    Frequency of Communication with Friends and Family: More than three times a week    Frequency of Social Gatherings with Friends and Family: Not on file     Attends Religious Services: 1 to 4 times per year    Active Member of [employer] or Organizations: Not on file    Attends Banker Meetings: Not on file    Marital Status: Married  Intimate Partner Violence: Not At Risk (12/06/2023)   Humiliation, Afraid, Rape, and Kick questionnaire    Fear of Current or Ex-Partner: No    Emotionally Abused: No    Physically Abused: No    Sexually Abused: No    Review of Systems Per HPI  Objective:   Vitals:   12/23/23 1335 12/23/23 1427  BP: (!) 154/68 122/70  Pulse: 82   Resp: 14   Temp: 98.6 F (37 C)   TempSrc: Temporal   SpO2: 99%   Weight: 185 lb 12.8 oz (84.3 kg)   Height: 5' 10 (1.778 m)      Physical Exam Vitals reviewed.  Constitutional:      Appearance: He is well-developed.  HENT:     Head: Normocephalic and atraumatic.  Neck:     Vascular: No carotid bruit or JVD.  Cardiovascular:     Rate and Rhythm: Normal rate and regular rhythm.     Heart sounds: Normal heart sounds. No murmur heard. Pulmonary:     Effort: Pulmonary effort is normal.     Breath sounds: Normal breath sounds. No rales.  Abdominal:     General: Abdomen is flat. There is no distension.  Musculoskeletal:     Right lower leg: No edema.     Left lower leg: No edema.     Comments: Left wrist, slight decreased flexion, extension compared to right.  Skin intact.  Bony prominence at the radial aspect of wrist, similar appearing on right wrist.  Discomfort below the first [facility].  Negative Tinel's.  Distal radius nontender.  DRUJ nontender.  Skin:    General: Skin is warm and dry.  Neurological:     Mental Status: He is alert and oriented to person, place, and time.  Psychiatric:        Mood and Affect: Mood normal.     Assessment & Plan:  WILKIE ZENON is a 73 y.o. male . Gout, unspecified cause, unspecified chronicity, unspecified site - Plan: Uric acid  - Stable and tolerating current dose allopurinol .  RTC precautions if flares.  Uric acid  as above.  Essential hypertension - Plan: Comprehensive metabolic panel with GFR  - Stable on current regimen, labs as above, no changes for now.    Hyperlipidemia, unspecified hyperlipidemia type - Plan: Lipid panel  - Tolerating current dose Lipitor, check labs and adjust plan accordingly  Ectatic abdominal aorta - Plan: US  AORTA  - Slight abnormality noted previously, check updated ultrasound.  Left wrist pain - Plan: Ambulatory referral to Hand Surgery, DG Wrist Complete Left  - Suspected degenerative joint disease, x-rays ordered, referred to hand specialist given persistent symptoms in spite of home treatments.  Urinary hesitancy - Plan: PSA Benign prostatic hyperplasia with urinary hesitancy - Plan: PSA  - Suspected BPH symptoms, consider urology eval depending on PSA or slight higher dose of doxazosin   Needs flu shot - Plan: Flu vaccine HIGH DOSE PF(Fluzone Trivalent)   No orders of the defined types were placed in this encounter.  Patient Instructions  Thanks for coming in today.  I think the left wrist pain certainly could be some arthritis but please have x-ray of that area at the [facility] location below.  If I see anything of concern I will let you know but I have also referred you to a hand specialist.  Okay to use Voltaren gel over-the-counter if needed for discomfort for now.    I am checking some labs today including your gout test, kidney liver and cholesterol test but no change in meds at this time.  I would like to see the PSA level first, but this that is stable we could consider either higher dose of doxazosin  or meeting with urology if you have persistent lower urinary symptoms from a suspected  enlarged prostate.  Lets wait on the labs first.    I did order the repeat ultrasound of the aorta with some borderline abnormality noted back in 2019.  Again, I will let you know if there are any concerns.  Follow-up with me in 6 months, happy to see you sooner if needed.   Take care    Signed,   Reyes Pines, MD [facility], [facility] 12/23/23 2:45 PM

## 2023-12-23 NOTE — Patient Instructions (Addendum)
 Thanks for coming in today.  I think the left wrist pain certainly could be some arthritis but please have x-ray of that area at the [facility] location below.  If I see anything of concern I will let you know but I have also referred you to a hand specialist.  Okay to use Voltaren gel over-the-counter if needed for discomfort for now.    I am checking some labs today including your gout test, kidney liver and cholesterol test but no change in meds at this time.  I would like to see the PSA level first, but this that is stable we could consider either higher dose of doxazosin  or meeting with urology if you have persistent lower urinary symptoms from a suspected enlarged prostate.  Lets wait on the labs first.    I did order the repeat ultrasound of the aorta with some borderline abnormality noted back in 2019.  Again, I will let you know if there are any concerns.  Follow-up with me in 6 months, happy to see you sooner if needed.  Take care

## 2023-12-24 LAB — PSA: PSA: 2.13 ng/mL (ref 0.10–4.00)

## 2023-12-24 LAB — COMPREHENSIVE METABOLIC PANEL WITH GFR
ALT: 21 U/L (ref 0–53)
AST: 25 U/L (ref 0–37)
Albumin: 4.5 g/dL (ref 3.5–5.2)
Alkaline Phosphatase: 54 U/L (ref 39–117)
BUN: 15 mg/dL (ref 6–23)
CO2: 29 meq/L (ref 19–32)
Calcium: 9.4 mg/dL (ref 8.4–10.5)
Chloride: 107 meq/L (ref 96–112)
Creatinine, Ser: 1.23 mg/dL (ref 0.40–1.50)
GFR: 57.86 mL/min — ABNORMAL LOW (ref >60.00)
Glucose, Bld: 118 mg/dL — ABNORMAL HIGH (ref 70–99)
Potassium: 3.9 meq/L (ref 3.5–5.1)
Sodium: 143 meq/L (ref 135–145)
Total Bilirubin: 0.6 mg/dL (ref 0.2–1.2)
Total Protein: 6.8 g/dL (ref 6.0–8.3)

## 2023-12-24 LAB — LIPID PANEL
Cholesterol: 149 mg/dL (ref 0–200)
HDL: 76.3 mg/dL (ref >39.00)
LDL Cholesterol: 63 mg/dL (ref 0–99)
NonHDL: 73.06
Total CHOL/HDL Ratio: 2
Triglycerides: 48 mg/dL (ref 0.0–149.0)
VLDL: 9.6 mg/dL (ref 0.0–40.0)

## 2023-12-24 LAB — URIC ACID: Uric Acid, Serum: 5.4 mg/dL (ref 4.0–7.8)

## 2023-12-28 ENCOUNTER — Ambulatory Visit: Payer: Self-pay | Admitting: Family Medicine

## 2024-01-03 NOTE — Progress Notes (Signed)
 Lab results have been discussed.   Verbalized understanding? Yes  Are there any questions? No

## 2024-01-05 ENCOUNTER — Other Ambulatory Visit: Payer: Self-pay

## 2024-01-05 ENCOUNTER — Ambulatory Visit: Admitting: Orthopedic Surgery

## 2024-01-05 DIAGNOSIS — M25531 Pain in right wrist: Secondary | ICD-10-CM

## 2024-01-05 DIAGNOSIS — M25532 Pain in left wrist: Secondary | ICD-10-CM

## 2024-01-06 ENCOUNTER — Encounter: Payer: Self-pay | Admitting: Orthopedic Surgery

## 2024-01-06 NOTE — Progress Notes (Signed)
 Office Visit Note   Patient: Matthew Simon           Date of Birth: [birth year]           MRN: [id number] Visit Date: 01/05/2024 Requested by: Levora Reyes SAUNDERS, MD [phone] [place],  [place] [address] PCP: Levora Reyes SAUNDERS, MD  Subjective: Chief Complaint  Patient presents with   Left Wrist - Pain    HPI: Matthew Simon is a 73 y.o. male who presents to the office reporting bilateral wrist and hand pain.  Left is worse than right.  He is right-hand dominant.  Reports some swelling and deformity around the radial styloid.  Has done well with his left quad tendon repair.  Stabbing pain is present on the left but his right hand generally is okay for most activities of daily living.  Golf is a little bit painful for him on that left-hand side but not the right-hand side.  Describes a relatively constant ache in the left wrist and he takes occasional Tylenol ..                ROS: All systems reviewed are negative as they relate to the chief complaint within the history of present illness.  Patient denies fevers or chills.  Assessment & Plan: Visit Diagnoses:  1. Pain in left wrist   2. 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  Interestingly the right wrist has more deformity but is not nearly as painful as the left wrist.  Both have scapholunate widening and scapholunate advanced collapse and arthritis.  In general oral is fairly functional at this time.  I would like to refer him to Dr. Erwin for evaluation and treatment primarily of the left wrist.  I do not know that he needs any significant surgical intervention but in terms of describing the expected outcome of any type of limited fusion versus injection he would be best advised by Dr. Erwin.  We will see him back as needed.  Follow-Up Instructions: No follow-ups on file.   Orders:  Orders Placed This Encounter  Procedures   XR Wrist 2 Views Left   XR Wrist 2  Views Right   Ambulatory referral to Orthopedic Surgery   No orders of the defined types were placed in this encounter.     Procedures: No procedures performed   Clinical Data: No additional findings.  Objective: Vital Signs: There were no vitals taken for this visit.  Physical Exam:  Constitutional: Patient appears well-developed HEENT:  Head: Normocephalic Eyes:EOM are normal Neck: Normal range of motion Cardiovascular: Normal rate Pulmonary/chest: Effort normal Neurologic: Patient is alert Skin: Skin is warm Psychiatric: Patient has normal mood and affect  Ortho Exam: Examination of the left wrist demonstrates prominent radial styloid.  EPL FPL interosseous is intact.  Radial pulses intact.  Patient has about 30 degrees of wrist dorsiflexion and about 20 degrees of wrist palmar flexion.  Mild pain with radial and ulnar deviation.  On the right-hand side the patient has about the same range of motion but a more prominent osteophyte formation around the radial styloid.  EPL FPL interosseous strength is intact.  Radial pulse intact bilaterally.  No paresthesias in the median radial or ulnar distribution.  Specialty Comments:  No specialty comments available.  Imaging: XR Wrist 2 Views Right Result Date: 01/06/2024 AP lateral radiographs right wrist reviewed.  Severe advanced SLAC wrist  is present with widening of the scapholunate interval and abutment of the capitate near the lunate fossa.  Significant osteophyte formation is noted around the radial styloid.  No acute fracture.  XR Wrist 2 Views Left Result Date: 01/06/2024 AP lateral radiographs left wrist reviewed.  Advanced SLAC wrist is present with significant radiocarpal arthritis and widening of the scapholunate interval.  Significant osteophyte formation noted on the radial styloid.  No acute fracture.    PMFS History: Patient Active Problem List   Diagnosis Date Noted   Rupture of left quadriceps tendon  08/09/2019   Soft tissue mass 05/22/2017   Gout 05/05/2016   Hypertension 04/10/2011   Colon polyp 04/10/2011   Past Medical History:  Diagnosis Date   Gout    Hypertension     Family History  Problem Relation Age of Onset   Cancer Mother        lymphoscarcoma   Stroke Father    Colon cancer Neg Hx    Rectal cancer Neg Hx    Stomach cancer Neg Hx     Past Surgical History:  Procedure Laterality Date   ARTHROSCOPIC REPAIR ACL Left    apx July    COLONOSCOPY     EXCISION MASS NECK N/A 05/24/2017   Procedure: EXCISION POSTERIOR NECK MASS;  Surgeon: Eletha Boas, MD;  Location: [facility] OR;  Service: General;  Laterality: N/A;   QUADRICEPS TENDON REPAIR Left 08/10/2019   Procedure: REPAIR QUADRICEP TENDON RUPTURE;  Surgeon: Addie Cordella Hamilton, MD;  Location: [facility] OR;  Service: Orthopedics;  Laterality: Left;   WISDOM TOOTH EXTRACTION     Social History   Occupational History   Occupation: business development   Occupation: [employer]RETIRED  Tobacco Use   Smoking status: Former    Current packs/day: 0.00    Types: Cigarettes    Quit date: 05/10/1964    Years since quitting: 59.6   Smokeless tobacco: Never  Vaping Use   Vaping status: Never Used  Substance and Sexual Activity   Alcohol use: Yes    Alcohol/week: 20.0 standard drinks of alcohol    Types: 10 Glasses of wine, 10 Standard drinks or equivalent per week    Comment: wine with dinner   Drug use: No   Sexual activity: Not Currently

## 2024-01-30 NOTE — Progress Notes (Unsigned)
 "  Matthew Simon - 73 y.o. male MRN [id number]  Date of birth: [birth year]  Office Visit Note: Visit Date: 01/31/2024 PCP: Matthew Reyes SAUNDERS, MD Referred by: Matthew Cordella Hamilton, MD  Subjective: No chief complaint on file.  HPI: Matthew Simon is a pleasant 73 y.o. male who presents today for open my partner Dr. Addie for specific hand surgical evaluation regarding ongoing bilateral wrist pain, chronic in nature but worsening.  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  ***  Pertinent ROS were reviewed with the patient and found to be negative unless otherwise specified above in HPI.   Visit Reason: Bilateral wrist pain Duration of symptoms: Hand dominance: {RIGHT/LEFT:20294} Occupation: Diabetic: {yes/no:20286} Smoking: {yes/no:20286} Heart/Lung History: Blood Thinners:   Prior Testing/EMG: Injections (Date): Treatments: Prior Surgery:    Assessment & Plan: Visit Diagnoses: No diagnosis found.  Plan: ***  Follow-up: No follow-ups on file.   Meds & Orders: No orders of the defined types were placed in this encounter.  No orders of the defined types were placed in this encounter.    Procedures: No procedures performed      Clinical History: No specialty comments available.  He reports that he quit smoking about 59 years ago. His smoking use included cigarettes. He has never used smokeless tobacco.  Recent Labs    07/19/23 0805 12/23/23 1439  HGBA1C 4.8  --   LABURIC  --  5.4    Objective:   Vital Signs: There were no vitals taken for this visit.  Physical Exam  Gen: Well-appearing, in no acute distress; non-toxic CV: Regular Rate. Well-perfused. Warm.  Resp: Breathing unlabored on room air; no wheezing. Psych: Fluid speech in conversation; appropriate affect; normal thought process  Ortho Exam PHYSICAL EXAM:  General: Patient is well appearing and in no  distress.  Skin and Muscle: *** skin changes are apparent to hands***.  Muscle bulk and contour normal, *** signs of atrophy.     Range of Motion and Palpation Tests: Mobility is full about the elbows with flexion and extension.  Forearm supination and pronation are *** bilaterally.  Wrist flexion/extension is *** bilaterally.  Digital flexion and extension are full.  Thumb opposition is full to the base of the small fingers bilaterally.    *** cords or nodules are palpated.  *** triggering is observed.    *** tenderness over the thumb CMC articulations is observed.  Scaphoid shift test is negative ***.  Finklestein test is negative ***.  Ulnar impingement test is ***.  No evidence of radiocarpal, midcarpal or intercarpal joint instability with provocation ***.  Neurologic, Vascular, Motor: Sensation is intact to light touch in the *** distributions.  Tinel's testing *** at cubital and carpal tunnel.  Two point discrimination is *** in the *** distribution.    Phalen's ***, Derkan's compression ***  Fingers pink and well perfused.  Capillary refill is brisk.      Lab Results  Component Value Date   HGBA1C 4.8 07/19/2023      Imaging: No results found.  Past Medical/Family/Surgical/Social History: Medications & Allergies reviewed per EMR, new medications updated. Patient Active Problem List   Diagnosis Date Noted   Rupture of left quadriceps tendon 08/09/2019   Soft tissue mass 05/22/2017   Gout 05/05/2016   Hypertension 04/10/2011   Colon polyp 04/10/2011   Past Medical History:  Diagnosis Date   Gout    Hypertension  Family History  Problem Relation Age of Onset   Cancer Mother        lymphoscarcoma   Stroke Father    Colon cancer Neg Hx    Rectal cancer Neg Hx    Stomach cancer Neg Hx    Past Surgical History:  Procedure Laterality Date   ARTHROSCOPIC REPAIR ACL Left    apx July    COLONOSCOPY     EXCISION MASS NECK N/A 05/24/2017   Procedure: EXCISION  POSTERIOR NECK MASS;  Surgeon: Eletha Boas, MD;  Location: [facility] OR;  Service: General;  Laterality: N/A;   QUADRICEPS TENDON REPAIR Left 08/10/2019   Procedure: REPAIR QUADRICEP TENDON RUPTURE;  Surgeon: Matthew Cordella Hamilton, MD;  Location: [facility] OR;  Service: Orthopedics;  Laterality: Left;   WISDOM TOOTH EXTRACTION     Social History   Occupational History   Occupation: business development   Occupation: [employer]RETIRED  Tobacco Use   Smoking status: Former    Current packs/day: 0.00    Types: Cigarettes    Quit date: 05/10/1964    Years since quitting: 59.7   Smokeless tobacco: Never  Vaping Use   Vaping status: Never Used  Substance and Sexual Activity   Alcohol use: Yes    Alcohol/week: 20.0 standard drinks of alcohol    Types: 10 Glasses of wine, 10 Standard drinks or equivalent per week    Comment: wine with dinner   Drug use: No   Sexual activity: Not Currently    Matthew Simon, M.D. [facility], Hand Surgery  "

## 2024-01-31 ENCOUNTER — Ambulatory Visit: Admitting: Orthopedic Surgery

## 2024-01-31 DIAGNOSIS — M25532 Pain in left wrist: Secondary | ICD-10-CM | POA: Diagnosis not present

## 2024-01-31 DIAGNOSIS — M25531 Pain in right wrist: Secondary | ICD-10-CM | POA: Diagnosis not present

## 2024-02-14 ENCOUNTER — Telehealth: Payer: Self-pay

## 2024-02-14 NOTE — Telephone Encounter (Signed)
 Called patient and read message that Dr. Levora sent back in November on labs. Patient asked I send a lab letter. I sent lab letter. Address confirmed

## 2024-02-14 NOTE — Telephone Encounter (Signed)
 Copied from CRM #[id number]. Topic: Clinical - Lab/Test Results >> Feb 14, 2024  2:14 PM Deleta RAMAN wrote: Reason for CRM: [phone] contact patient regarding test on kidneys and would like to confirm he's not diabetic

## 2024-02-15 ENCOUNTER — Encounter: Admitting: Physical Medicine and Rehabilitation

## 2024-02-28 ENCOUNTER — Other Ambulatory Visit: Payer: Self-pay | Admitting: Family Medicine

## 2024-02-28 DIAGNOSIS — E785 Hyperlipidemia, unspecified: Secondary | ICD-10-CM

## 2024-03-08 ENCOUNTER — Ambulatory Visit: Admitting: Physical Medicine and Rehabilitation

## 2024-03-08 DIAGNOSIS — R202 Paresthesia of skin: Secondary | ICD-10-CM

## 2024-03-08 NOTE — Progress Notes (Unsigned)
 Pain Scale   Average Pain 0 Patient advising he has issues with bilateral hands , Weakness.  Patient is Right hand Dominate        +Driver, -BT, -Dye Allergies.

## 2024-03-08 NOTE — Progress Notes (Unsigned)
 "  Matthew Simon - 74 y.o. male MRN [id number]  Date of birth: 1950-11-22  Office Visit Note: Visit Date: 03/08/2024 PCP: Levora Reyes SAUNDERS, MD Referred by: Arlinda Buster, MD  Subjective: Chief Complaint  Patient presents with   Right Hand - Weakness   Left Hand - Numbness   HPI: Matthew Simon is a 74 y.o. male who comes in today at the request of Dr. Anshul Agarwala for evaluation and management of chronic, worsening and severe pain, numbness and tingling in the Bilateral upper extremities.  Patient is Right hand dominant.  referral from my partner Dr. Addie for specific hand surgical evaluation regarding ongoing bilateral wrist pain, chronic in nature but worsening, specifically on the left side.  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  He is also describing some intermittent numbness in the bilateral index fingers.  He enjoys playing tennis and golf, has been able to maintain activity despite his ongoing wrist pain.   I spent more than 30 minutes speaking face-to-face with the patient with 50% of the time in counseling and discussing coordination of care.       Review of Systems  Musculoskeletal:  Positive for joint pain.  Neurological:  Positive for tingling and focal weakness.  All other systems reviewed and are negative.  Otherwise per HPI.  Assessment & Plan: Visit Diagnoses:    ICD-10-CM   1. Paresthesia of skin  R20.2        Plan: No additional findings.   Meds & Orders: No orders of the defined types were placed in this encounter.  No orders of the defined types were placed in this encounter.   Follow-up: No follow-ups on file.   Procedures: No procedures performed      Clinical History: No specialty comments available.   He reports that he quit smoking about 59 years ago. His smoking use included cigarettes. He has never used smokeless tobacco.  Recent Labs     07/19/23 0805 12/23/23 1439  HGBA1C 4.8  --   LABURIC  --  5.4    Objective:  VS:  HT:    WT:   BMI:     BP:   HR: bpm  TEMP: ( )  RESP:  Physical Exam Vitals and nursing note reviewed.  Constitutional:      General: He is not in acute distress.    Appearance: Normal appearance. He is well-developed.  HENT:     Head: Normocephalic and atraumatic.  Eyes:     Conjunctiva/sclera: Conjunctivae normal.     Pupils: Pupils are equal, round, and reactive to light.  Cardiovascular:     Rate and Rhythm: Normal rate.     Pulses: Normal pulses.     Heart sounds: Normal heart sounds.  Pulmonary:     Effort: Pulmonary effort is normal. No respiratory distress.  Musculoskeletal:        General: No tenderness.     Cervical back: Normal range of motion and neck supple. No rigidity.     Right lower leg: No edema.     Left lower leg: No edema.     Comments: Inspection reveals no atrophy of the bilateral APB or FDI or hand intrinsics. There is no swelling, color changes, allodynia or dystrophic changes. There is 5 out of 5 strength in the bilateral wrist extension, finger abduction and long finger flexion. There is intact sensation to light touch in all dermatomal and  peripheral nerve distributions. There is a negative Froment's test bilaterally. There is a negative Tinel's test at the bilateral wrist and elbow. There is a negative Phalen's test bilaterally. There is a negative Hoffmann's test bilaterally.  Skin:    General: Skin is warm and dry.     Findings: No erythema or rash.  Neurological:     General: No focal deficit present.     Mental Status: He is alert and oriented to person, place, and time.     Sensory: No sensory deficit.     Motor: No weakness or abnormal muscle tone.     Coordination: Coordination normal.     Gait: Gait normal.  Psychiatric:        Mood and Affect: Mood normal.        Behavior: Behavior normal.        Thought Content: Thought content normal.     Ortho  Exam  Imaging: No results found.  Past Medical/Family/Surgical/Social History: Medications & Allergies reviewed per EMR, new medications updated. Patient Active Problem List   Diagnosis Date Noted   Rupture of left quadriceps tendon 08/09/2019   Soft tissue mass 05/22/2017   Gout 05/05/2016   Hypertension 04/10/2011   Colon polyp 04/10/2011   Past Medical History:  Diagnosis Date   Gout    Hypertension    Family History  Problem Relation Age of Onset   Cancer Mother        lymphoscarcoma   Stroke Father    Colon cancer Neg Hx    Rectal cancer Neg Hx    Stomach cancer Neg Hx    Past Surgical History:  Procedure Laterality Date   ARTHROSCOPIC REPAIR ACL Left    apx July    COLONOSCOPY     EXCISION MASS NECK N/A 05/24/2017   Procedure: EXCISION POSTERIOR NECK MASS;  Surgeon: Eletha Boas, MD;  Location: [facility] OR;  Service: General;  Laterality: N/A;   QUADRICEPS TENDON REPAIR Left 08/10/2019   Procedure: REPAIR QUADRICEP TENDON RUPTURE;  Surgeon: Addie Cordella Hamilton, MD;  Location: [facility] OR;  Service: Orthopedics;  Laterality: Left;   WISDOM TOOTH EXTRACTION     Social History   Occupational History   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

## 2024-03-20 ENCOUNTER — Ambulatory Visit: Admitting: Orthopedic Surgery

## 2024-06-29 ENCOUNTER — Encounter: Admitting: Family Medicine
